# Patient Record
Sex: Male | Born: 1955
Health system: Southern US, Community
[De-identification: ages and names within clinical notes are randomized; demographics above are authoritative.]

## PROBLEM LIST (undated history)

## (undated) DIAGNOSIS — E785 Hyperlipidemia, unspecified: Secondary | ICD-10-CM

## (undated) DIAGNOSIS — I10 Essential (primary) hypertension: Secondary | ICD-10-CM

## (undated) DIAGNOSIS — C9110 Chronic lymphocytic leukemia of B-cell type not having achieved remission: Secondary | ICD-10-CM

## (undated) DIAGNOSIS — IMO0002 Reserved for concepts with insufficient information to code with codable children: Secondary | ICD-10-CM

## (undated) DIAGNOSIS — M1990 Unspecified osteoarthritis, unspecified site: Secondary | ICD-10-CM

## (undated) DIAGNOSIS — T7840XA Allergy, unspecified, initial encounter: Secondary | ICD-10-CM

## (undated) DIAGNOSIS — B029 Zoster without complications: Secondary | ICD-10-CM

## (undated) DIAGNOSIS — K219 Gastro-esophageal reflux disease without esophagitis: Secondary | ICD-10-CM

## (undated) HISTORY — DX: Gastro-esophageal reflux disease without esophagitis: K21.9

## (undated) HISTORY — DX: Hyperlipidemia, unspecified: E78.5

## (undated) HISTORY — DX: Allergy, unspecified, initial encounter: T78.40XA

## (undated) HISTORY — PX: SHOULDER SURGERY: SHX246

## (undated) HISTORY — DX: Reserved for concepts with insufficient information to code with codable children: IMO0002

## (undated) HISTORY — PX: WRIST SURGERY: SHX841

## (undated) HISTORY — DX: Essential (primary) hypertension: I10

## (undated) HISTORY — PX: KNEE SURGERY: SHX244

## (undated) HISTORY — DX: Unspecified osteoarthritis, unspecified site: M19.90

## (undated) HISTORY — PX: HERNIA REPAIR: SHX51

## (undated) HISTORY — DX: Chronic lymphocytic leukemia of B-cell type not having achieved remission: C91.10

---

## 1983-07-24 HISTORY — PX: HAND SURGERY: SHX662

## 1988-07-23 HISTORY — PX: HERNIA REPAIR: SHX51

## 2001-04-13 ENCOUNTER — Emergency Department (HOSPITAL_COMMUNITY): Admission: EM | Admit: 2001-04-13 | Discharge: 2001-04-13 | Payer: Self-pay | Admitting: Emergency Medicine

## 2001-04-18 ENCOUNTER — Emergency Department (HOSPITAL_COMMUNITY): Admission: EM | Admit: 2001-04-18 | Discharge: 2001-04-18 | Payer: Self-pay | Admitting: Emergency Medicine

## 2003-05-06 ENCOUNTER — Encounter: Payer: Self-pay | Admitting: Specialist

## 2003-05-06 ENCOUNTER — Ambulatory Visit (HOSPITAL_COMMUNITY): Admission: RE | Admit: 2003-05-06 | Discharge: 2003-05-06 | Payer: Self-pay | Admitting: Specialist

## 2003-07-24 HISTORY — PX: KNEE SURGERY: SHX244

## 2005-03-04 ENCOUNTER — Emergency Department (HOSPITAL_COMMUNITY): Admission: EM | Admit: 2005-03-04 | Discharge: 2005-03-04 | Payer: Self-pay | Admitting: Family Medicine

## 2005-07-23 HISTORY — PX: SHOULDER SURGERY: SHX246

## 2008-09-23 ENCOUNTER — Encounter: Admission: RE | Admit: 2008-09-23 | Discharge: 2008-09-23 | Payer: Self-pay | Admitting: Specialist

## 2008-11-19 ENCOUNTER — Ambulatory Visit (HOSPITAL_BASED_OUTPATIENT_CLINIC_OR_DEPARTMENT_OTHER): Admission: RE | Admit: 2008-11-19 | Discharge: 2008-11-19 | Payer: Self-pay | Admitting: Specialist

## 2009-04-06 ENCOUNTER — Ambulatory Visit: Payer: Self-pay | Admitting: Hematology & Oncology

## 2009-04-21 ENCOUNTER — Encounter: Payer: Self-pay | Admitting: Hematology & Oncology

## 2009-04-21 ENCOUNTER — Other Ambulatory Visit: Admission: RE | Admit: 2009-04-21 | Discharge: 2009-04-21 | Payer: Self-pay | Admitting: Hematology & Oncology

## 2009-04-21 LAB — CBC WITH DIFFERENTIAL (CANCER CENTER ONLY)
BASO#: 0.2 10*3/uL (ref 0.0–0.2)
BASO%: 1.4 % (ref 0.0–2.0)
EOS%: 2.3 % (ref 0.0–7.0)
Eosinophils Absolute: 0.3 10*3/uL (ref 0.0–0.5)
HCT: 41.9 % (ref 38.7–49.9)
HGB: 14.7 g/dL (ref 13.0–17.1)
LYMPH#: 9 10*3/uL — ABNORMAL HIGH (ref 0.9–3.3)
LYMPH%: 63.5 % — ABNORMAL HIGH (ref 14.0–48.0)
MCH: 33.2 pg (ref 28.0–33.4)
MCHC: 35.1 g/dL (ref 32.0–35.9)
MCV: 95 fL (ref 82–98)
MONO#: 0.5 10*3/uL (ref 0.1–0.9)
MONO%: 3.4 % (ref 0.0–13.0)
NEUT#: 4.2 10*3/uL (ref 1.5–6.5)
NEUT%: 29.4 % — ABNORMAL LOW (ref 40.0–80.0)
Platelets: 172 10*3/uL (ref 145–400)
RBC: 4.43 10*6/uL (ref 4.20–5.70)
RDW: 11.1 % (ref 10.5–14.6)
WBC: 14.2 10*3/uL — ABNORMAL HIGH (ref 4.0–10.0)

## 2009-04-21 LAB — TECHNOLOGIST REVIEW CHCC SATELLITE

## 2009-04-21 LAB — CHCC SATELLITE - SMEAR

## 2009-04-25 LAB — PROTEIN ELECTROPHORESIS, SERUM
Albumin ELP: 61.1 % (ref 55.8–66.1)
Alpha-1-Globulin: 4.1 % (ref 2.9–4.9)
Alpha-2-Globulin: 10.8 % (ref 7.1–11.8)
Beta 2: 4.5 % (ref 3.2–6.5)
Beta Globulin: 6.1 % (ref 4.7–7.2)
Gamma Globulin: 13.4 % (ref 11.1–18.8)
Total Protein, Serum Electrophoresis: 6.4 g/dL (ref 6.0–8.3)

## 2009-04-25 LAB — LACTATE DEHYDROGENASE: LDH: 124 U/L (ref 94–250)

## 2009-04-25 LAB — DIRECT ANTIGLOBULIN TEST (NOT AT ARMC)
DAT (Complement): NEGATIVE
DAT IgG: NEGATIVE

## 2009-04-25 LAB — FLOW CYTOMETRY - CHCC SATELLITE

## 2009-06-28 ENCOUNTER — Ambulatory Visit: Payer: Self-pay | Admitting: Hematology & Oncology

## 2009-08-05 ENCOUNTER — Ambulatory Visit: Payer: Self-pay | Admitting: Hematology & Oncology

## 2009-08-08 LAB — CBC WITH DIFFERENTIAL (CANCER CENTER ONLY)
BASO#: 0.3 10*3/uL — ABNORMAL HIGH (ref 0.0–0.2)
BASO%: 1.6 % (ref 0.0–2.0)
EOS%: 2 % (ref 0.0–7.0)
Eosinophils Absolute: 0.3 10*3/uL (ref 0.0–0.5)
HCT: 46.4 % (ref 38.7–49.9)
HGB: 15.9 g/dL (ref 13.0–17.1)
LYMPH#: 10.8 10*3/uL — ABNORMAL HIGH (ref 0.9–3.3)
LYMPH%: 66 % — ABNORMAL HIGH (ref 14.0–48.0)
MCH: 32.7 pg (ref 28.0–33.4)
MCHC: 34.3 g/dL (ref 32.0–35.9)
MCV: 95 fL (ref 82–98)
MONO#: 0.6 10*3/uL (ref 0.1–0.9)
MONO%: 3.4 % (ref 0.0–13.0)
NEUT#: 4.4 10*3/uL (ref 1.5–6.5)
NEUT%: 27 % — ABNORMAL LOW (ref 40.0–80.0)
Platelets: 189 10*3/uL (ref 145–400)
RBC: 4.87 10*6/uL (ref 4.20–5.70)
RDW: 11.6 % (ref 10.5–14.6)
WBC: 16.3 10*3/uL — ABNORMAL HIGH (ref 4.0–10.0)

## 2009-08-08 LAB — TECHNOLOGIST REVIEW CHCC SATELLITE

## 2009-08-08 LAB — CHCC SATELLITE - SMEAR

## 2009-11-10 ENCOUNTER — Emergency Department (HOSPITAL_COMMUNITY): Admission: EM | Admit: 2009-11-10 | Discharge: 2009-11-10 | Payer: Self-pay | Admitting: Family Medicine

## 2009-11-13 ENCOUNTER — Emergency Department (HOSPITAL_COMMUNITY): Admission: EM | Admit: 2009-11-13 | Discharge: 2009-11-13 | Payer: Self-pay | Admitting: Family Medicine

## 2009-11-17 ENCOUNTER — Ambulatory Visit: Payer: Self-pay | Admitting: Hematology & Oncology

## 2009-11-21 LAB — CBC WITH DIFFERENTIAL (CANCER CENTER ONLY)
BASO#: 0.3 10*3/uL — ABNORMAL HIGH (ref 0.0–0.2)
BASO%: 2 % (ref 0.0–2.0)
EOS%: 2.1 % (ref 0.0–7.0)
Eosinophils Absolute: 0.4 10*3/uL (ref 0.0–0.5)
HCT: 45.2 % (ref 38.7–49.9)
HGB: 15.1 g/dL (ref 13.0–17.1)
LYMPH#: 11.8 10*3/uL — ABNORMAL HIGH (ref 0.9–3.3)
LYMPH%: 70.2 % — ABNORMAL HIGH (ref 14.0–48.0)
MCH: 32.1 pg (ref 28.0–33.4)
MCHC: 33.5 g/dL (ref 32.0–35.9)
MCV: 96 fL (ref 82–98)
MONO#: 0.5 10*3/uL (ref 0.1–0.9)
MONO%: 2.9 % (ref 0.0–13.0)
NEUT#: 3.8 10*3/uL (ref 1.5–6.5)
NEUT%: 22.8 % — ABNORMAL LOW (ref 40.0–80.0)
Platelets: 158 10*3/uL (ref 145–400)
RBC: 4.71 10*6/uL (ref 4.20–5.70)
RDW: 11.6 % (ref 10.5–14.6)
WBC: 16.8 10*3/uL — ABNORMAL HIGH (ref 4.0–10.0)

## 2009-11-21 LAB — CHCC SATELLITE - SMEAR

## 2010-04-04 ENCOUNTER — Ambulatory Visit: Payer: Self-pay | Admitting: Hematology & Oncology

## 2010-04-06 LAB — TECHNOLOGIST REVIEW CHCC SATELLITE

## 2010-04-06 LAB — CBC WITH DIFFERENTIAL (CANCER CENTER ONLY)
BASO#: 0.4 10*3/uL — ABNORMAL HIGH (ref 0.0–0.2)
BASO%: 2 % (ref 0.0–2.0)
EOS%: 1.4 % (ref 0.0–7.0)
Eosinophils Absolute: 0.3 10*3/uL (ref 0.0–0.5)
HCT: 45.9 % (ref 38.7–49.9)
HGB: 15.6 g/dL (ref 13.0–17.1)
LYMPH#: 14 10*3/uL — ABNORMAL HIGH (ref 0.9–3.3)
LYMPH%: 73.9 % — ABNORMAL HIGH (ref 14.0–48.0)
MCH: 32.5 pg (ref 28.0–33.4)
MCHC: 33.9 g/dL (ref 32.0–35.9)
MCV: 96 fL (ref 82–98)
MONO#: 0.6 10*3/uL (ref 0.1–0.9)
MONO%: 3.3 % (ref 0.0–13.0)
NEUT#: 3.7 10*3/uL (ref 1.5–6.5)
NEUT%: 19.4 % — ABNORMAL LOW (ref 40.0–80.0)
Platelets: 179 10*3/uL (ref 145–400)
RBC: 4.78 10*6/uL (ref 4.20–5.70)
RDW: 11.1 % (ref 10.5–14.6)
WBC: 18.9 10*3/uL — ABNORMAL HIGH (ref 4.0–10.0)

## 2010-04-06 LAB — CHCC SATELLITE - SMEAR

## 2010-05-14 ENCOUNTER — Emergency Department (HOSPITAL_COMMUNITY): Admission: EM | Admit: 2010-05-14 | Discharge: 2010-05-14 | Payer: Self-pay | Admitting: Family Medicine

## 2010-05-29 ENCOUNTER — Emergency Department (HOSPITAL_COMMUNITY)
Admission: EM | Admit: 2010-05-29 | Discharge: 2010-05-29 | Payer: Self-pay | Source: Home / Self Care | Admitting: Emergency Medicine

## 2010-07-14 ENCOUNTER — Emergency Department (HOSPITAL_COMMUNITY)
Admission: EM | Admit: 2010-07-14 | Discharge: 2010-07-14 | Payer: Self-pay | Source: Home / Self Care | Admitting: Family Medicine

## 2010-08-15 ENCOUNTER — Ambulatory Visit (HOSPITAL_BASED_OUTPATIENT_CLINIC_OR_DEPARTMENT_OTHER): Payer: Commercial Managed Care - PPO | Admitting: Hematology & Oncology

## 2010-08-24 ENCOUNTER — Encounter (HOSPITAL_BASED_OUTPATIENT_CLINIC_OR_DEPARTMENT_OTHER): Payer: Commercial Managed Care - PPO | Admitting: Hematology & Oncology

## 2010-08-24 DIAGNOSIS — C9110 Chronic lymphocytic leukemia of B-cell type not having achieved remission: Secondary | ICD-10-CM

## 2010-08-24 LAB — CBC WITH DIFFERENTIAL (CANCER CENTER ONLY)
BASO#: 0.8 10*3/uL — ABNORMAL HIGH (ref 0.0–0.2)
BASO%: 2.9 % — ABNORMAL HIGH (ref 0.0–2.0)
EOS%: 1.1 % (ref 0.0–7.0)
Eosinophils Absolute: 0.3 10*3/uL (ref 0.0–0.5)
HCT: 45.6 % (ref 38.7–49.9)
HGB: 15.7 g/dL (ref 13.0–17.1)
LYMPH#: 20.3 10*3/uL — ABNORMAL HIGH (ref 0.9–3.3)
LYMPH%: 79 % — ABNORMAL HIGH (ref 14.0–48.0)
MCH: 33.6 pg — ABNORMAL HIGH (ref 28.0–33.4)
MCHC: 34.4 g/dL (ref 32.0–35.9)
MCV: 98 fL (ref 82–98)
MONO#: 1.1 10*3/uL — ABNORMAL HIGH (ref 0.1–0.9)
MONO%: 4.1 % (ref 0.0–13.0)
NEUT#: 3.3 10*3/uL (ref 1.5–6.5)
NEUT%: 12.9 % — ABNORMAL LOW (ref 40.0–80.0)
Platelets: 158 10*3/uL (ref 145–400)
RBC: 4.67 10*6/uL (ref 4.20–5.70)
RDW: 11.2 % (ref 10.5–14.6)
WBC: 25.7 10*3/uL — ABNORMAL HIGH (ref 4.0–10.0)

## 2010-08-24 LAB — TECHNOLOGIST REVIEW CHCC SATELLITE

## 2010-08-24 LAB — CHCC SATELLITE - SMEAR

## 2010-11-01 LAB — POCT HEMOGLOBIN-HEMACUE: Hemoglobin: 15.8 g/dL (ref 13.0–17.0)

## 2010-11-16 ENCOUNTER — Other Ambulatory Visit: Payer: Self-pay | Admitting: Hematology & Oncology

## 2010-11-16 ENCOUNTER — Encounter (HOSPITAL_BASED_OUTPATIENT_CLINIC_OR_DEPARTMENT_OTHER): Payer: Commercial Managed Care - PPO | Admitting: Hematology & Oncology

## 2010-11-16 DIAGNOSIS — C9110 Chronic lymphocytic leukemia of B-cell type not having achieved remission: Secondary | ICD-10-CM

## 2010-11-16 LAB — CBC WITH DIFFERENTIAL (CANCER CENTER ONLY)
HCT: 42.9 % (ref 38.7–49.9)
HGB: 14.8 g/dL (ref 13.0–17.1)
MCH: 32.6 pg (ref 28.0–33.4)
MCHC: 34.5 g/dL (ref 32.0–35.9)
MCV: 95 fL (ref 82–98)
Platelets: 144 10*3/uL — ABNORMAL LOW (ref 145–400)
RBC: 4.54 10*6/uL (ref 4.20–5.70)
RDW: 13.1 % (ref 11.1–15.7)
WBC: 38.6 10*3/uL — ABNORMAL HIGH (ref 4.0–10.0)

## 2010-11-16 LAB — MANUAL DIFFERENTIAL (CHCC SATELLITE)
ALC: 32.4 10*3/uL — ABNORMAL HIGH (ref 0.9–3.3)
ANC (CHCC HP manual diff): 5.4 10*3/uL (ref 1.5–6.5)
Eos: 1 % (ref 0–7)
LYMPH: 84 % — ABNORMAL HIGH (ref 14–48)
MONO: 1 % (ref 0–13)
PLT EST ~~LOC~~: ADEQUATE
Platelet Morphology: NORMAL
SEG: 14 % — ABNORMAL LOW (ref 40–75)

## 2010-11-16 LAB — CHCC SATELLITE - SMEAR

## 2010-11-20 LAB — SPEP & IFE WITH QIG
Albumin ELP: 63.8 % (ref 55.8–66.1)
Alpha-1-Globulin: 4.1 % (ref 2.9–4.9)
Alpha-2-Globulin: 10.9 % (ref 7.1–11.8)
Beta 2: 3.9 % (ref 3.2–6.5)
Beta Globulin: 6 % (ref 4.7–7.2)
Gamma Globulin: 11.3 % (ref 11.1–18.8)
IgA: 106 mg/dL (ref 68–378)
IgG (Immunoglobin G), Serum: 825 mg/dL (ref 694–1618)
IgM, Serum: 34 mg/dL — ABNORMAL LOW (ref 60–263)
Total Protein, Serum Electrophoresis: 6.7 g/dL (ref 6.0–8.3)

## 2010-11-20 LAB — LACTATE DEHYDROGENASE: LDH: 146 U/L (ref 94–250)

## 2010-11-20 LAB — BETA 2 MICROGLOBULIN, SERUM: Beta-2 Microglobulin: 2 mg/L — ABNORMAL HIGH (ref 1.01–1.73)

## 2010-12-05 NOTE — Op Note (Signed)
NAME:  Charles [name], Charles [name] NO.:  [id number]   MEDICAL RECORD NO.:  [id number]          PATIENT TYPE:  AMB   LOCATION:  [facility]                         FACILITY:  [facility]   PHYSICIAN:  Erasmo Leventhal, M.D.DATE OF BIRTH:  07/02/1956   DATE OF PROCEDURE:  11/19/2008  DATE OF DISCHARGE:                               OPERATIVE REPORT   PREOPERATIVE DIAGNOSES:  1. Left shoulder labral tear.  2. Biceps tear.  3. Impingement syndrome.  4. Rotator cuff tear.  5. Acromioclavicular arthritis.   POSTOPERATIVE DIAGNOSES:  1. Left shoulder superior labral degenerative tearing.  2. Partial biceps tear, 25 cm.  3. Chronic impingement syndrome.  4. Rotator cuff tear, full-thickness supraspinatus insertion, 1.5-2      cm.  5. Acromioclavicular arthritis.   PROCEDURE:  1. Left shoulder exam under anesthesia.  2. Manipulation under anesthesia.  3. Glenohumeral arthroscopy with labral debridement.  4. Debridement of partial biceps tear.  5. Arthroscopic subacromial decompression, acromioplasty, bursectomy      to stable __________.  6. Arthroscopic clavicle resection Mumford procedure.  7. Arthroscopic rotator cuff repair.   SURGEON:  Erasmo Leventhal, M.D.   ASSISTANT:  Jamelle Rushing, P.A.-C.   ANESTHESIA:  Interscalene block with general, Dr. Shireen Quan.   BLOOD LOSS:  Less than 10 mL.   DRAINS:  None.   COMPLICATIONS:  None.   DISPOSITION:  PACU, stable.   OPERATIVE DETAILS:  Patient and family were counseled in the holding  area.  Correct side was identified, IV was started and interscalene  block was administered by Dr. Shireen Quan.  Taken to OR, IV antibiotics  were given.  Left shoulder was examined.  Slightly tight in abduction,  forward flexion.  Gentle manipulation revealed full motion.   Turned to right lateral decubitus position, properly padded and bumped.  Left shoulder prepped with DuraPrep, draped in sterile fashion.  Arthrex  shoulder positioner  utilized, 30 degrees abduction, 10 degrees forward  flexion and 20 pounds longitudinal traction due to the large size of his  arm.  Posterior portal was created and arthroscope placed in the  glenohumeral joint.   Diagnostic arthroscopy revealed multiple labral degenerative type tears  of the superior labrum but there was no __________ of biceps anchor.  There was a full rotator cuff tear at the supraspinatus, approximately  1.5 to 2 cm.  Lateral portal was established.  Scope was introduced into  the joint through the tear.  The superior labral tissue was debrided  back to a stable edge and then smoothed down with the ArthroCare system.  Biceps was inspected, 25% partial tear, debrided with the motorized  shaver.  The rotator cuff was then debrided back to healthy tissues and  repair site down to bone was repaired.  Remainder of intra-articular  anatomy was normal.   Subacromial region revealed thick subacromial bursitis.  Subacromial  bursa was resected.  Type 2 acromion.  ArthroCare system was utilized to  remove the periosteum, CA ligament.  Bur was then placed posteriorly and  an anterior-inferior acromioplasty was performed.  The [facility] joint was  found to be  markedly osteoarthritic.   Another entry portal was made.  The lateral 5-8 mm of his clavicle was  removed circumferentially, leaving the anterior, superior and posterior  acromioclavicular capsule intact.  Clavicle was palpated, found to be  stable.  Arthroscopic debris was removed.  Hemostasis was obtained.   Another small puncture wound was made.  An Arthrex bioabsorbable anchor  was placed at the appropriate angle.  Mattress sutures were placed and  tied down separately and then into a push-lock anchor for a double-row  technique.  We now had a well-mobilized repaired rotator cuff, covering  the rotator cuff footprint, excellent healthy tissue.  No other  abnormalities were noted.  Irrigant and arthroscopic equipment was   removed.  Taken out of traction.  After confirming anesthesia, another  20 mL of 0.25% Marcaine with epi placed in the portal sites, subacromial  region.  Placed into an abduction sling, turned supine, given another  gram of Ancef intravenously, awakened, taken from the operating room to  PACU in stable condition.  Sponge and needle count correct.  No  complications or problems.  He will be stabilized in the PACU and then  discharged to home.   Help with surgical technique, decision-making, Mr. Oneida Alar, PA-C's  assistance was needed throughout the entire case.           ______________________________  Erasmo Leventhal, M.D.     RAC/MEDQ  D:  11/19/2008  T:  11/19/2008  Job:  [id number]

## 2011-01-31 ENCOUNTER — Other Ambulatory Visit: Payer: Self-pay | Admitting: Hematology & Oncology

## 2011-01-31 ENCOUNTER — Encounter (HOSPITAL_BASED_OUTPATIENT_CLINIC_OR_DEPARTMENT_OTHER): Payer: Commercial Managed Care - PPO | Admitting: Hematology & Oncology

## 2011-01-31 DIAGNOSIS — C9110 Chronic lymphocytic leukemia of B-cell type not having achieved remission: Secondary | ICD-10-CM

## 2011-01-31 LAB — CBC WITH DIFFERENTIAL (CANCER CENTER ONLY)
HCT: 41.3 % (ref 38.7–49.9)
HGB: 14.6 g/dL (ref 13.0–17.1)
MCH: 33.7 pg — ABNORMAL HIGH (ref 28.0–33.4)
MCHC: 35.4 g/dL (ref 32.0–35.9)
MCV: 95 fL (ref 82–98)
Platelets: 126 10*3/uL — ABNORMAL LOW (ref 145–400)
RBC: 4.33 10*6/uL (ref 4.20–5.70)
RDW: 12.8 % (ref 11.1–15.7)
WBC: 45.4 10*3/uL — ABNORMAL HIGH (ref 4.0–10.0)

## 2011-01-31 LAB — MANUAL DIFFERENTIAL (CHCC SATELLITE)
ALC: 37.7 10*3/uL — ABNORMAL HIGH (ref 0.9–3.3)
ANC (CHCC HP manual diff): 5.4 10*3/uL (ref 1.5–6.5)
BASO: 1 % (ref 0–2)
Eos: 3 % (ref 0–7)
LYMPH: 83 % — ABNORMAL HIGH (ref 14–48)
MONO: 1 % (ref 0–13)
PLT EST ~~LOC~~: DECREASED
Platelet Morphology: NORMAL
SEG: 12 % — ABNORMAL LOW (ref 40–75)

## 2011-01-31 LAB — CHCC SATELLITE - SMEAR

## 2011-02-02 LAB — SPEP & IFE WITH QIG
Albumin ELP: 62.8 % (ref 55.8–66.1)
Alpha-1-Globulin: 4.2 % (ref 2.9–4.9)
Alpha-2-Globulin: 11.1 % (ref 7.1–11.8)
Beta 2: 4.4 % (ref 3.2–6.5)
Beta Globulin: 6.2 % (ref 4.7–7.2)
Gamma Globulin: 11.3 % (ref 11.1–18.8)
IgA: 102 mg/dL (ref 68–379)
IgG (Immunoglobin G), Serum: 817 mg/dL (ref 650–1600)
IgM, Serum: 28 mg/dL — ABNORMAL LOW (ref 41–251)
Total Protein, Serum Electrophoresis: 6.3 g/dL (ref 6.0–8.3)

## 2011-02-02 LAB — LACTATE DEHYDROGENASE: LDH: 140 U/L (ref 94–250)

## 2011-02-02 LAB — RETICULOCYTES (CHCC)
ABS Retic: 79.6 10*3/uL (ref 19.0–186.0)
RBC.: 4.42 MIL/uL (ref 4.22–5.81)
Retic Ct Pct: 1.8 % (ref 0.4–2.3)

## 2011-05-03 ENCOUNTER — Other Ambulatory Visit: Payer: Self-pay | Admitting: Hematology & Oncology

## 2011-05-03 ENCOUNTER — Encounter (HOSPITAL_BASED_OUTPATIENT_CLINIC_OR_DEPARTMENT_OTHER): Payer: Commercial Managed Care - PPO | Admitting: Hematology & Oncology

## 2011-05-03 DIAGNOSIS — C9110 Chronic lymphocytic leukemia of B-cell type not having achieved remission: Secondary | ICD-10-CM

## 2011-05-03 LAB — COMPREHENSIVE METABOLIC PANEL
ALT: 18 U/L (ref 0–53)
AST: 27 U/L (ref 0–37)
Albumin: 4.5 g/dL (ref 3.5–5.2)
Alkaline Phosphatase: 85 U/L (ref 39–117)
BUN: 9 mg/dL (ref 6–23)
CO2: 24 mEq/L (ref 19–32)
Calcium: 9.2 mg/dL (ref 8.4–10.5)
Chloride: 104 mEq/L (ref 96–112)
Creatinine, Ser: 1 mg/dL (ref 0.50–1.35)
Glucose, Bld: 112 mg/dL — ABNORMAL HIGH (ref 70–99)
Potassium: 4.5 mEq/L (ref 3.5–5.3)
Sodium: 139 mEq/L (ref 135–145)
Total Bilirubin: 0.5 mg/dL (ref 0.3–1.2)
Total Protein: 6.6 g/dL (ref 6.0–8.3)

## 2011-05-03 LAB — CBC WITH DIFFERENTIAL (CANCER CENTER ONLY)
HCT: 42.7 % (ref 38.7–49.9)
HGB: 15 g/dL (ref 13.0–17.1)
MCH: 33.5 pg — ABNORMAL HIGH (ref 28.0–33.4)
MCHC: 35.1 g/dL (ref 32.0–35.9)
MCV: 95 fL (ref 82–98)
Platelets: 139 10*3/uL — ABNORMAL LOW (ref 145–400)
RBC: 4.48 10*6/uL (ref 4.20–5.70)
RDW: 13.1 % (ref 11.1–15.7)
WBC: 45.1 10*3/uL — ABNORMAL HIGH (ref 4.0–10.0)

## 2011-05-03 LAB — MANUAL DIFFERENTIAL (CHCC SATELLITE)
ALC: 40.6 10*3/uL — ABNORMAL HIGH (ref 0.9–3.3)
ANC (CHCC HP manual diff): 4.1 10*3/uL (ref 1.5–6.5)
BASO: 1 % (ref 0–2)
LYMPH: 90 % — ABNORMAL HIGH (ref 14–48)
PLT EST ~~LOC~~: DECREASED
Platelet Morphology: NORMAL
SEG: 9 % — ABNORMAL LOW (ref 40–75)

## 2011-05-03 LAB — LACTATE DEHYDROGENASE: LDH: 157 U/L (ref 94–250)

## 2011-05-03 LAB — CHCC SATELLITE - SMEAR

## 2011-08-03 ENCOUNTER — Ambulatory Visit (HOSPITAL_BASED_OUTPATIENT_CLINIC_OR_DEPARTMENT_OTHER)
Admission: RE | Admit: 2011-08-03 | Discharge: 2011-08-03 | Disposition: A | Discharge status: Home / Self Care | Payer: 59 | Source: Ambulatory Visit | Attending: Hematology & Oncology | Admitting: Hematology & Oncology

## 2011-08-03 ENCOUNTER — Other Ambulatory Visit: Payer: Self-pay | Admitting: Hematology & Oncology

## 2011-08-03 ENCOUNTER — Ambulatory Visit (HOSPITAL_BASED_OUTPATIENT_CLINIC_OR_DEPARTMENT_OTHER): Payer: 59 | Admitting: Hematology & Oncology

## 2011-08-03 ENCOUNTER — Other Ambulatory Visit: Payer: Commercial Managed Care - PPO | Admitting: Lab

## 2011-08-03 ENCOUNTER — Encounter: Payer: Self-pay | Admitting: Hematology & Oncology

## 2011-08-03 DIAGNOSIS — D696 Thrombocytopenia, unspecified: Secondary | ICD-10-CM

## 2011-08-03 DIAGNOSIS — C9110 Chronic lymphocytic leukemia of B-cell type not having achieved remission: Secondary | ICD-10-CM

## 2011-08-03 DIAGNOSIS — M7989 Other specified soft tissue disorders: Secondary | ICD-10-CM

## 2011-08-03 DIAGNOSIS — M79609 Pain in unspecified limb: Secondary | ICD-10-CM

## 2011-08-03 HISTORY — DX: Chronic lymphocytic leukemia of B-cell type not having achieved remission: C91.10

## 2011-08-03 LAB — CBC WITH DIFFERENTIAL (CANCER CENTER ONLY)
HCT: 43.6 % (ref 38.7–49.9)
HGB: 14.9 g/dL (ref 13.0–17.1)
MCH: 33 pg (ref 28.0–33.4)
MCHC: 34.2 g/dL (ref 32.0–35.9)
MCV: 97 fL (ref 82–98)
Platelets: 139 10*3/uL — ABNORMAL LOW (ref 145–400)
RBC: 4.51 10*6/uL (ref 4.20–5.70)
RDW: 13.2 % (ref 11.1–15.7)
WBC: 63.6 10*3/uL (ref 4.0–10.0)

## 2011-08-03 LAB — MANUAL DIFFERENTIAL (CHCC SATELLITE)
ALC: 59.8 10*3/uL — ABNORMAL HIGH (ref 0.9–3.3)
ANC (CHCC HP manual diff): 3.2 10*3/uL (ref 1.5–6.5)
LYMPH: 94 % — ABNORMAL HIGH (ref 14–48)
MONO: 1 % (ref 0–13)
PLT EST ~~LOC~~: DECREASED
Platelet Morphology: NORMAL
SEG: 5 % — ABNORMAL LOW (ref 40–75)

## 2011-08-03 LAB — CHCC SATELLITE - SMEAR

## 2011-08-03 NOTE — Progress Notes (Signed)
This office note has been dictated.

## 2011-08-04 LAB — IGG, IGA, IGM
IgA: 91 mg/dL (ref 68–379)
IgG (Immunoglobin G), Serum: 760 mg/dL (ref 650–1600)
IgM, Serum: 27 mg/dL — ABNORMAL LOW (ref 41–251)

## 2011-08-04 NOTE — Progress Notes (Signed)
CC:   [name], M.D.  DIAGNOSIS:  CLL-stage B.  CURRENT THERAPY:  Observation.  INTERIM HISTORY:  [name] comes in for his followup.  We last saw him back in October.  He had no problems over the holiday.  He is still working at [employer] in [employer].  He is also a Naval architect.  His only complaint has been that for the past 3 days he has noted some discomfort in his right lower leg.  He says it starts behind the knee and then goes down his leg.  There has been no swelling.  He has had no redness in this area.  He says sometimes when he was stands up, that there is some weakness in his right leg.  We did go ahead and do a Doppler of his leg.  The Doppler was negative for a DVT.  Still awaiting to hear the final report regarding the possibility of a Baker cyst.  He did have shingles in the left T3 dermatome last year.  This has improved and he is not bothered by this at all.  He has had no fever.  He has not noted any swollen lymph glands.  He has had no nausea or vomiting.  There has been no change in bowel or bladder habits.  He has had really no fatigue.  Overall, his perform status is ECOG 0.  PHYSICAL EXAMINATION:  This is a well-developed, well-nourished white gentleman in no obvious distress.  Vital Signs:  97.2, pulse of 59, respiratory rate 14, blood pressure 122/76.  Weight is 258.  Head and neck exam shows a normocephalic, atraumatic skull.  There are no ocular or oral lesions.  There are no palpable cervical or supraclavicular lymph nodes.  Lungs are clear bilaterally.  Cardiac:  Regular rate and rhythm with a normal S1 and S2.  There are no murmurs, rubs or bruits. Abdomen:  Soft, mildly obese.  He has good bowel sounds.  There is no fluid wave.  There is no guarding or rebound tenderness.  There is no palpable hepatosplenomegaly.  Axillary exam shows no bilateral axillary adenopathy.  Extremities show no clubbing, cyanosis or edema.  I  cannot feel any palpable fullness behind the right knee.  There is no palpable venous cord in his legs.  He has a negative Homans' sign with his right leg.  He has good pulses in his distal extremities.  Skin:  No rashes, ecchymoses or petechia.  LABORATORY STUDIES:  White count is 63.6, hemoglobin 15, hematocrit 44, platelet count 140.  White cell differential shows 5% segs, 94% lymphocytes.  IMPRESSION:  Mr. Handel is a 56 year old white gentleman with CLL.  He has stage B disease.  His is not anemic.  His thrombocytopenia is minimal.  His white cell count certainly has "jumped up" since we last saw him. This might be a transient type of increase.  We are going to have to get him back in about 6 weeks or so.  I think that if his white cell count continues to go up, we are going to have to "jump in" and consider a more aggressive staging workup and possibly therapy.  We spent a good 45 minutes or so with Mr. Miklos today.  I told Mr. Hearty that if his leg continues to bother him in week or so, to let me know.  I would then get an MRI of his lower back, to make sure there is nothing going on with his  spinal cord.    ______________________________ Josph Macho, M.D. PRE/MEDQ  D:  08/03/2011  T:  08/04/2011  Job:  [id number]

## 2011-09-06 ENCOUNTER — Ambulatory Visit (HOSPITAL_BASED_OUTPATIENT_CLINIC_OR_DEPARTMENT_OTHER): Payer: 59 | Admitting: Hematology & Oncology

## 2011-09-06 ENCOUNTER — Other Ambulatory Visit (HOSPITAL_BASED_OUTPATIENT_CLINIC_OR_DEPARTMENT_OTHER): Payer: 59 | Admitting: Lab

## 2011-09-06 VITALS — BP 151/80 | HR 75 | Temp 97.0°F | Ht 69.0 in | Wt 255.0 lb

## 2011-09-06 DIAGNOSIS — C9110 Chronic lymphocytic leukemia of B-cell type not having achieved remission: Secondary | ICD-10-CM

## 2011-09-06 LAB — MANUAL DIFFERENTIAL (CHCC SATELLITE)
ALC: 60.2 10*3/uL — ABNORMAL HIGH (ref 0.9–3.3)
ANC (CHCC HP manual diff): 6.8 10*3/uL — ABNORMAL HIGH (ref 1.5–6.5)
Eos: 1 % (ref 0–7)
LYMPH: 88 % — ABNORMAL HIGH (ref 14–48)
MONO: 1 % (ref 0–13)
PLT EST ~~LOC~~: DECREASED
Platelet Morphology: NORMAL
SEG: 10 % — ABNORMAL LOW (ref 40–75)

## 2011-09-06 LAB — CBC WITH DIFFERENTIAL (CANCER CENTER ONLY)
HCT: 42.5 % (ref 38.7–49.9)
HGB: 14.4 g/dL (ref 13.0–17.1)
MCH: 33 pg (ref 28.0–33.4)
MCHC: 33.9 g/dL (ref 32.0–35.9)
MCV: 98 fL (ref 82–98)
Platelets: 139 10*3/uL — ABNORMAL LOW (ref 145–400)
RBC: 4.36 10*6/uL (ref 4.20–5.70)
RDW: 13.3 % (ref 11.1–15.7)
WBC: 68.4 10*3/uL (ref 4.0–10.0)

## 2011-09-06 NOTE — Progress Notes (Signed)
CC:   [name], M.D.  DIAGNOSIS:  Stage B chronic lymphocytic leukemia.  CURRENT THERAPY:  Observation.  INTERIM HISTORY:  Mr. Mitrano comes in for followup.  He is doing well. When we last saw him, his white cell count had jumped up from, I think, 45 to 63.  I was a little worried about that.  This is why I wanted him to come in a little bit sooner so that we could make sure his white cell count is not going up quickly.  He is still working without any problems.  He has had no fever.  His shingles that he developed in the left T3 dermatome has pretty much resolved.  He has not noted any problems with rashes.  He has had no change in bowel or bladder habits.  There have been no problems with leg swelling. He has had no cough.  There has been no headache.  PHYSICAL EXAMINATION:  General Appearance:  This is a well-developed, well-nourished, white gentleman in no obvious distress.  Vital Signs: Temperature 97.  Pulse 75.  Respiratory rate 16.  Blood pressure 151/80. Weight is 255.  Head and Neck Exam:  A normocephalic, atraumatic skull. There are no ocular or oral lesions.  There are no palpable cervical or supraclavicular lymph nodes.  Lungs:  Clear bilaterally.  Cardiac Exam: Regular rate and rhythm with a normal S1, S2.  There are no murmurs, rubs, or bruits.  Abdominal Exam:  Soft with good bowel sounds.  There is no palpable abdominal mass.  There is no fluid wave.  There is no palpable hepatosplenomegaly.  Back Exam:  No tenderness over the spine, ribs, or hips.  Extremities:  No clubbing, cyanosis, or edema. Neurological Exam:  No focal neurological deficits.  LABORATORY STUDIES:  White cell count is 68.4, hemoglobin 14.1, hematocrit 42.5, platelet count 139.  White cell differential shows 10% segs, 88% lymphocytes.  Peripheral smear shows increase in white cells. He has a majority of lymphocytes.  He has a few large lymphocytes.  I see no prolymphocytes.  His white  cells otherwise are with mature polys. His red cells are with no rouleaux formation.  There are no nucleated red blood cells.  There are no target cells.  Platelets are adequate in number and size.  Platelets are well granulated.  IMPRESSION:  Mr. Cavanah is a 56 year old, white gentleman with stage B chronic lymphocytic leukemia.  He is progressing slowly but surely.  I suspect that we likely will need to start treatment on him within 3-4 months.  I will have Mr. Savarese come back to see me in another 6 weeks.  If we ever do need to start him on therapy, we will have to do a complete workup with bone marrow, CT scan, and additional lab work.    ______________________________ Josph Macho, M.D. PRE/MEDQ  D:  09/06/2011  T:  09/06/2011  Job:  [id number]

## 2011-09-06 NOTE — Progress Notes (Signed)
This office note has been dictated.

## 2011-09-07 ENCOUNTER — Ambulatory Visit: Payer: 59 | Admitting: Hematology & Oncology

## 2011-09-07 ENCOUNTER — Other Ambulatory Visit: Payer: 59 | Admitting: Lab

## 2011-10-10 ENCOUNTER — Encounter (HOSPITAL_COMMUNITY): Payer: Self-pay | Admitting: Emergency Medicine

## 2011-10-10 ENCOUNTER — Emergency Department (HOSPITAL_COMMUNITY)
Admission: EM | Admit: 2011-10-10 | Discharge: 2011-10-10 | Disposition: A | Discharge status: Home / Self Care | Payer: 59 | Source: Home / Self Care | Attending: Emergency Medicine | Admitting: Emergency Medicine

## 2011-10-10 DIAGNOSIS — L03031 Cellulitis of right toe: Secondary | ICD-10-CM

## 2011-10-10 DIAGNOSIS — L02619 Cutaneous abscess of unspecified foot: Secondary | ICD-10-CM

## 2011-10-10 DIAGNOSIS — L03039 Cellulitis of unspecified toe: Secondary | ICD-10-CM

## 2011-10-10 HISTORY — DX: Zoster without complications: B02.9

## 2011-10-10 MED ORDER — IBUPROFEN 600 MG PO TABS: 600.0000 mg | ORAL_TABLET | Freq: Four times a day (QID) | ORAL | Status: AC | PRN

## 2011-10-10 MED ORDER — CEPHALEXIN 500 MG PO CAPS: 500.0000 mg | ORAL_CAPSULE | Freq: Four times a day (QID) | ORAL | Status: AC

## 2011-10-10 NOTE — ED Provider Notes (Signed)
History     CSN: [id number]  Arrival date & time 10/10/11  8295   First MD Initiated Contact with Patient 10/10/11 1021      Chief Complaint  Patient presents with  . Toe Pain    (Consider location/radiation/quality/duration/timing/severity/associated sxs/prior treatment) HPI Comments: Patient reports atraumatic right big toe redness, swelling at the lateral nail starting 2 days ago. Patient states that he was wearing narrow box shoes, but has now switched to a wider shoes since his symptoms started. Started soaking in Epsom salts last night, which helped somewhat. Does not recall any recent trauma to the area. Denies cutting his nails too short. No drainage, joint swelling, rash, numbness, fevers, redness streaking up his foot. No remote history of injury to this toe. No history of gout.  ROS as noted in HPI. All other ROS negative.   Patient is a 56 y.o. male presenting with toe pain. The history is provided by the patient. No language interpreter was used.  Toe Pain This is a new problem. The current episode started 2 days ago. The symptoms are aggravated by walking. The symptoms are relieved by nothing. He has tried a warm compress for the symptoms. The treatment provided mild relief.    Past Medical History  Diagnosis Date  . CLL (chronic lymphoblastic leukemia) 08/03/2011  . Shingles     Past Surgical History  Procedure Date  . Knee surgery   . Shoulder surgery   . Hernia repair   . Wrist surgery     History reviewed. No pertinent family history.  History  Substance Use Topics  . Smoking status: Never Smoker   . Smokeless tobacco: Not on file  . Alcohol Use: Yes      Review of Systems  Allergies  Review of patient's allergies indicates no known allergies.  Home Medications   Current Outpatient Rx  Name Route Sig Dispense Refill  . ACYCLOVIR 400 MG PO TABS Oral Take 500 mg by mouth daily.     Marland Kitchen VITAMIN C 1000 MG PO TABS Oral Take 1,000 mg by mouth  daily.    . CEPHALEXIN 500 MG PO CAPS Oral Take 1 capsule (500 mg total) by mouth 4 (four) times daily. X 10 days 40 capsule 0  . VITAMIN D3 2000 UNITS PO TABS Oral Take 2,000 Units by mouth.    . ESOMEPRAZOLE MAGNESIUM 40 MG PO CPDR Oral Take 40 mg by mouth daily before breakfast.    . IBUPROFEN 600 MG PO TABS Oral Take 1 tablet (600 mg total) by mouth every 6 (six) hours as needed for pain. 30 tablet 0  . ROSUVASTATIN CALCIUM 10 MG PO TABS Oral Take 10 mg by mouth daily.      BP 146/77  Pulse 63  Temp(Src) 98.3 F (36.8 C) (Oral)  Resp 18  SpO2 96%  Physical Exam  Nursing note and vitals reviewed. Constitutional: He is oriented to person, place, and time. He appears well-developed and well-nourished.  HENT:  Head: Normocephalic and atraumatic.  Eyes: Conjunctivae and EOM are normal.  Neck: Normal range of motion.  Cardiovascular: Normal rate.   Pulmonary/Chest: Effort normal. No respiratory distress.  Abdominal: He exhibits no distension.  Musculoskeletal: Normal range of motion.       Feet:       Redness, mild swelling, lateral nail fold. No expressible purulent drainage No evidence of paronychia, felon. Patient able to move all toes actively. Toe stable on varus/valgus stress. No tenderness, redness at the DIP  or MTP joint. Refill <2 seconds. Rest of foot within normal limits.  Neurological: He is alert and oriented to person, place, and time.  Skin: Skin is warm and dry.  Psychiatric: He has a normal mood and affect. His behavior is normal.    ED Course  Procedures (including critical care time)  Labs Reviewed - No data to display No results found.   1. Cellulitis of great toe, right     No evidence of gout, fracture. Appears to have a mild cellulitis/early ingrown toenail. Sending him home on Keflex, have him continue warm soaks, and elevate the nail with some cotton. If no improvement, we'll refer him to [facility]. Patient agrees with plan.  MDM  Previous  records reviewed.    Luiz Blare, MD 10/10/11 [id number]

## 2011-10-10 NOTE — ED Notes (Signed)
Pt c/o right big toe pain X 2 days. Pt denies hitting/stubbing it on anything. Pt stated "swelling has gone down some".

## 2011-10-10 NOTE — Discharge Instructions (Signed)
You may try to wedge a piece of cotton under the toenail to help prevent it from becoming ingrown. Continue soaking your foot in warm soapy water, and dry it carefully. Wear wide comfortable shoes. Elevate your foot as much as you can. Return if you get worse, have a fever >100,4, or for any concerns.

## 2011-10-19 ENCOUNTER — Ambulatory Visit (HOSPITAL_BASED_OUTPATIENT_CLINIC_OR_DEPARTMENT_OTHER): Payer: 59 | Admitting: Hematology & Oncology

## 2011-10-19 ENCOUNTER — Other Ambulatory Visit (HOSPITAL_BASED_OUTPATIENT_CLINIC_OR_DEPARTMENT_OTHER): Payer: 59 | Admitting: Lab

## 2011-10-19 VITALS — BP 148/72 | HR 56 | Temp 96.8°F | Ht 69.0 in | Wt 249.0 lb

## 2011-10-19 DIAGNOSIS — C9110 Chronic lymphocytic leukemia of B-cell type not having achieved remission: Secondary | ICD-10-CM

## 2011-10-19 LAB — MANUAL DIFFERENTIAL (CHCC SATELLITE)
ALC: 54.1 10*3/uL — ABNORMAL HIGH (ref 0.9–3.3)
ANC (CHCC HP manual diff): 3.6 10*3/uL (ref 1.5–6.5)
BASO: 1 % (ref 0–2)
Eos: 1 % (ref 0–7)
LYMPH: 90 % — ABNORMAL HIGH (ref 14–48)
MONO: 2 % (ref 0–13)
PLT EST ~~LOC~~: ADEQUATE
Platelet Morphology: NORMAL
RBC Comments: NORMAL
SEG: 6 % — ABNORMAL LOW (ref 40–75)

## 2011-10-19 LAB — CBC WITH DIFFERENTIAL (CANCER CENTER ONLY)
HCT: 42 % (ref 38.7–49.9)
HGB: 14.1 g/dL (ref 13.0–17.1)
MCH: 33.5 pg — ABNORMAL HIGH (ref 28.0–33.4)
MCHC: 33.6 g/dL (ref 32.0–35.9)
MCV: 100 fL — ABNORMAL HIGH (ref 82–98)
Platelets: 168 10*3/uL (ref 145–400)
RBC: 4.21 10*6/uL (ref 4.20–5.70)
RDW: 14.1 % (ref 11.1–15.7)
WBC: 60.1 10*3/uL (ref 4.0–10.0)

## 2011-10-19 LAB — CHCC SATELLITE - SMEAR

## 2011-10-19 NOTE — Progress Notes (Signed)
This office note has been dictated.

## 2011-10-22 NOTE — Progress Notes (Signed)
CC:   [name], M.D.  DIAGNOSIS:  Stage B chronic lymphocytic leukemia.  CURRENT THERAPY:  Observation.  INTERVAL HISTORY:  Mr. Schor comes in for his followup.  He continues to do well.  He is still working over at [employer] in [employer].  He also does Environmental education officer work.  He is getting ready for the resurrection Sunday weekend.  He has had no problems with fever.  He has had no sweats.  He has had no nausea and vomiting.  He has not noticed any palpable lymph glands.  He did have shingles in the left T3 dermatome.  This has not caused any problems for him.  He has had no problems with bowels or bladder.  He has not noticed any bleeding or bruising.  PHYSICAL EXAM:  This is a well-developed well-nourished white gentleman in no obvious distress.  Vital signs:  96.8, pulse 56, respiratory rate 18, blood pressure 148/72, weight is 249.  Head and neck:  A normocephalic, atraumatic skull.  There are no ocular or oral lesions. There are no palpable cervical or supraclavicular lymph nodes.  Lungs: Clear bilaterally.  Cardiac:  Regular rate and rhythm with a normal S1, S2.  There are no murmurs, rubs or bruits.  Abdomen:  Soft with good bowel sounds.  There is no palpable abdominal mass.  There is no fluid wave.  No palpable hepatosplenomegaly.  Axillary:  No bilateral axillary lymph nodes.  Neurological:  No focal neurological deficits.  LABORATORY DATA:  White cell count 60, hemoglobin 14.1, hematocrit 42, platelet count 168.  White cell differential shows 6 segs, 90% lymphocytes.  IMPRESSION:  Mr. Knickerbocker is a 56 year old gentleman with stage B chronic lymphocytic leukemia.  His white cell count actually is trending downward which is nice to see.  He is asymptomatic still.  I do not see any evidence for intervention with therapy right now.  I think we can probably get him back in 3 more months for followup.  I feel more reassured about his CLL right  now.  Again, the key, from my point of view, is that he is asymptomatic.   ______________________________ Josph Macho, M.D. PRE/MEDQ  D:  10/22/2011  T:  10/22/2011  Job:  [id number]

## 2011-12-28 ENCOUNTER — Other Ambulatory Visit (HOSPITAL_BASED_OUTPATIENT_CLINIC_OR_DEPARTMENT_OTHER): Payer: 59 | Admitting: Lab

## 2011-12-28 ENCOUNTER — Ambulatory Visit (HOSPITAL_BASED_OUTPATIENT_CLINIC_OR_DEPARTMENT_OTHER): Payer: 59 | Admitting: Hematology & Oncology

## 2011-12-28 VITALS — BP 142/81 | HR 58 | Temp 97.7°F | Ht 69.0 in | Wt 251.0 lb

## 2011-12-28 DIAGNOSIS — C9110 Chronic lymphocytic leukemia of B-cell type not having achieved remission: Secondary | ICD-10-CM

## 2011-12-28 LAB — CBC WITH DIFFERENTIAL (CANCER CENTER ONLY)
HCT: 41.4 % (ref 38.7–49.9)
HGB: 14.1 g/dL (ref 13.0–17.1)
MCH: 34 pg — ABNORMAL HIGH (ref 28.0–33.4)
MCHC: 34.1 g/dL (ref 32.0–35.9)
MCV: 100 fL — ABNORMAL HIGH (ref 82–98)
Platelets: 125 10*3/uL — ABNORMAL LOW (ref 145–400)
RBC: 4.15 10*6/uL — ABNORMAL LOW (ref 4.20–5.70)
RDW: 13.7 % (ref 11.1–15.7)
WBC: 73.3 10*3/uL (ref 4.0–10.0)

## 2011-12-28 LAB — MANUAL DIFFERENTIAL (CHCC SATELLITE)
ALC: 67.4 10*3/uL — ABNORMAL HIGH (ref 0.9–3.3)
ANC (CHCC HP manual diff): 4.4 10*3/uL (ref 1.5–6.5)
LYMPH: 92 % — ABNORMAL HIGH (ref 14–48)
MONO: 2 % (ref 0–13)
PLT EST ~~LOC~~: DECREASED
Platelet Morphology: NORMAL
RBC Comments: NORMAL
SEG: 6 % — ABNORMAL LOW (ref 40–75)

## 2011-12-28 LAB — CHCC SATELLITE - SMEAR

## 2011-12-28 NOTE — Progress Notes (Signed)
This office note has been dictated.

## 2011-12-29 NOTE — Progress Notes (Signed)
CC:   [name], M.D.  DIAGNOSIS:  Stage B chronic lymphocytic leukemia.  CURRENT THERAPY:  Observation.  INTERIM HISTORY:  [name] comes in for followup.  He is still doing well.  He has had no complaints at all.  He is still working over at [employer] in [employer].  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  He is also doing his Environmental education officer department work.  This is keeping him quite busy.  He has had no fevers.  He has had no rashes.  He has had no change in bowel or bladder habits.  He has had no cough.  There has been no leg swelling.  PHYSICAL EXAMINATION:  General:  This is a well-developed, well- nourished white gentleman in no obvious distress.  Vital signs:  Show a temperature of 97.7, pulse 58, respiratory rate 20, blood pressure 133/81.  Weight is 251.  Head and neck:  Exam shows a normocephalic, atraumatic skull.  There are no ocular or oral lesions.  There are no palpable cervical, supraclavicular lymph nodes.  Lungs:  Clear bilaterally.  Cardiac:  Regular rate and rhythm with normal S1, S2. There are no murmurs, rubs or bruits.  Abdomen:  Soft with good bowel sounds.  There is no palpable abdominal mass.  There is no palpable hepatosplenomegaly.  Back:  No tenderness over the spine, ribs or hips. Extremities:  Shows no clubbing, cyanosis or edema.  Axillary exam: Shows some palpable left axillary lymph nodes.  These are mobile, firm and slightly tender.  The largest 1 measures probably about 1.5 cm.  LABORATORY STUDIES:  White cell count 73.3, hemoglobin 14, hematocrit 31, platelet count 125.  IMPRESSION:  Charles [name] is a 56 year old gentleman with stage B chronic lymphocytic leukemia.  We have been following him now for a year or so.  So far, we have not had to pursue any other interventions as I have not needed to embark upon any therapy.  We will go ahead and get him back in another 2-3 months.  His  platelet count is down a bit.  He has done this before, however.  Again, I suspect that we are probably going to have to initiate therapy before the year is finished.    ______________________________ Josph Macho, M.D. PRE/MEDQ  D:  12/29/2011  T:  12/29/2011  Job:  [id number]

## 2012-02-08 ENCOUNTER — Other Ambulatory Visit: Payer: Self-pay | Admitting: *Deleted

## 2012-02-08 DIAGNOSIS — C9110 Chronic lymphocytic leukemia of B-cell type not having achieved remission: Secondary | ICD-10-CM

## 2012-02-08 MED ORDER — FAMCICLOVIR 500 MG PO TABS: 500.0000 mg | ORAL_TABLET | Freq: Every day | ORAL | Status: DC

## 2012-02-29 ENCOUNTER — Ambulatory Visit: Payer: 59 | Admitting: Hematology & Oncology

## 2012-02-29 ENCOUNTER — Other Ambulatory Visit: Payer: 59 | Admitting: Lab

## 2012-03-07 ENCOUNTER — Other Ambulatory Visit: Payer: Self-pay | Admitting: *Deleted

## 2012-03-07 ENCOUNTER — Ambulatory Visit (HOSPITAL_BASED_OUTPATIENT_CLINIC_OR_DEPARTMENT_OTHER): Payer: 59 | Admitting: Lab

## 2012-03-07 ENCOUNTER — Ambulatory Visit (HOSPITAL_BASED_OUTPATIENT_CLINIC_OR_DEPARTMENT_OTHER): Payer: 59 | Admitting: Hematology & Oncology

## 2012-03-07 VITALS — BP 136/77 | HR 59 | Temp 97.3°F | Resp 20 | Ht 69.0 in | Wt 245.0 lb

## 2012-03-07 DIAGNOSIS — C9110 Chronic lymphocytic leukemia of B-cell type not having achieved remission: Secondary | ICD-10-CM

## 2012-03-07 DIAGNOSIS — D696 Thrombocytopenia, unspecified: Secondary | ICD-10-CM

## 2012-03-07 LAB — CBC WITH DIFFERENTIAL (CANCER CENTER ONLY)
HCT: 40.9 % (ref 38.7–49.9)
HGB: 13.9 g/dL (ref 13.0–17.1)
MCH: 34.1 pg — ABNORMAL HIGH (ref 28.0–33.4)
MCHC: 34 g/dL (ref 32.0–35.9)
MCV: 100 fL — ABNORMAL HIGH (ref 82–98)
Platelets: 99 10*3/uL — ABNORMAL LOW (ref 145–400)
RBC: 4.08 10*6/uL — ABNORMAL LOW (ref 4.20–5.70)
RDW: 13.4 % (ref 11.1–15.7)
WBC: 87.2 10*3/uL (ref 4.0–10.0)

## 2012-03-07 LAB — MANUAL DIFFERENTIAL (CHCC SATELLITE)
ALC: 84.6 10*3/uL — ABNORMAL HIGH (ref 0.9–3.3)
ANC (CHCC HP manual diff): 0.9 10*3/uL — ABNORMAL LOW (ref 1.5–6.5)
LYMPH: 97 % — ABNORMAL HIGH (ref 14–48)
MONO: 2 % (ref 0–13)
PLT EST ~~LOC~~: DECREASED
Platelet Morphology: NORMAL
SEG: 1 % — ABNORMAL LOW (ref 40–75)

## 2012-03-07 LAB — CHCC SATELLITE - SMEAR

## 2012-03-07 NOTE — Progress Notes (Signed)
This office note has been dictated.

## 2012-03-08 NOTE — Progress Notes (Signed)
CC:   [name], M.D.  DIAGNOSIS:  Stage C chronic lymphocytic leukemia.  CURRENT THERAPY:  The patient will start chemotherapy with Treanda/Rituxan in 3-4 weeks.  INTERIM HISTORY:  [name] comes in for followup.  He is feeling well.  He has really had no specific complaints.  He has not noticed any swollen lymph glands.  He has had no fevers, sweats, or chills.  He is still working pretty much full-time.  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  He does a second job with the Warden/ranger.  He has had no bleeding or bruising.  He has not noted any rashes.  He has had no cough or shortness of breath.  Currently, he is on Famvir because of a history of shingles.  PHYSICAL EXAMINATION:  General:  This is a well-developed, well- nourished, white gentleman in no obvious distress.  Vital Signs: Temperature of 97.3, pulse 59, respiratory rate 20, blood pressure 136/77.  Weight is 245.  Head and Neck:  Normocephalic, atraumatic skull.  There are no ocular or oral lesions.  There are no palpable cervical or supraclavicular lymph nodes.  Lungs:  Clear bilaterally. Cardiac:  Regular rate and rhythm with a normal S1 and S2.  There are no murmurs, rubs, or bruits.  Abdomen:  Soft with good bowel sounds.  There is no palpable abdominal mass.  There is no palpable hepatosplenomegaly. Back:  No tenderness over the spine, ribs, or hips.  Extremities:  No clubbing, cyanosis, or edema.  Neurologic:  No focal neurological deficits.  Skin:  No rashes, ecchymoses, or petechia.  Axillary:  Some sable left axillary lymph nodes.  LABORATORY STUDIES:  White cell count is 87.2, hemoglobin 14, hematocrit 41, platelet count 99.  IMPRESSION:  [name] is a 56 year old gentleman with stage C chronic lymphocytic leukemia now.  He is thrombocytopenic.  His platelet count continues to drop.  His blood counts continues to arise.  I think everything is "falling into place" regarding his CLL  becoming active.  I think now is the time that we are going to have to start therapy on him.  We are going to have to do a bone marrow on him.  We will get him set up on August 27th for bone marrow biopsy.  He also needs to have CT scans done.  We will get these set up.  I talked to [name] at length.  I gave him some information about chemotherapy.  I think that Treanda/Rituxan would be a good choice for him.  The response rate really should be over 90%.  I think the bone marrow will be critical in that we will be able to send off cytogenetics to see if he does have any adverse chromosomal abnormalities.  I spent a good 40 minutes or so with [name].  He understands the situation well.  He does not want to set a date yet for actual chemo.  I told that we would probably would need to start in a month or so.  We will plan to get him in for treatment when he decides when.    ______________________________ Josph Macho, M.D. PRE/MEDQ  D:  03/07/2012  T:  03/08/2012  Job:  [id number]

## 2012-03-10 ENCOUNTER — Telehealth: Payer: Self-pay | Admitting: Hematology & Oncology

## 2012-03-10 ENCOUNTER — Encounter (HOSPITAL_COMMUNITY): Payer: Self-pay | Admitting: Pharmacy Technician

## 2012-03-10 NOTE — Telephone Encounter (Signed)
Pt aware of 8-27 BMBX and 8-28 CT and to drink at 8am and 9 am and to be NPO 4 hrs. He will call to schedule chemo

## 2012-03-18 ENCOUNTER — Ambulatory Visit (HOSPITAL_BASED_OUTPATIENT_CLINIC_OR_DEPARTMENT_OTHER): Payer: 59 | Admitting: Hematology & Oncology

## 2012-03-18 ENCOUNTER — Ambulatory Visit (HOSPITAL_COMMUNITY)
Admission: RE | Admit: 2012-03-18 | Discharge: 2012-03-18 | Disposition: A | Discharge status: Home / Self Care | Payer: 59 | Source: Ambulatory Visit | Attending: Hematology & Oncology | Admitting: Hematology & Oncology

## 2012-03-18 ENCOUNTER — Encounter (HOSPITAL_COMMUNITY): Payer: Self-pay

## 2012-03-18 ENCOUNTER — Other Ambulatory Visit: Payer: Self-pay | Admitting: Hematology & Oncology

## 2012-03-18 ENCOUNTER — Encounter: Payer: Self-pay | Admitting: Hematology & Oncology

## 2012-03-18 VITALS — BP 139/75 | HR 52 | Temp 97.5°F | Resp 18 | Ht 69.0 in | Wt 240.0 lb

## 2012-03-18 DIAGNOSIS — C9110 Chronic lymphocytic leukemia of B-cell type not having achieved remission: Secondary | ICD-10-CM | POA: Insufficient documentation

## 2012-03-18 DIAGNOSIS — D696 Thrombocytopenia, unspecified: Secondary | ICD-10-CM | POA: Insufficient documentation

## 2012-03-18 HISTORY — DX: Chronic lymphocytic leukemia of B-cell type not having achieved remission: C91.10

## 2012-03-18 LAB — CBC WITH DIFFERENTIAL/PLATELET
Basophils Absolute: 0 10*3/uL (ref 0.0–0.1)
Basophils Relative: 0 % (ref 0–1)
Eosinophils Absolute: 0 10*3/uL (ref 0.0–0.7)
Eosinophils Relative: 0 % (ref 0–5)
HCT: 39.6 % (ref 39.0–52.0)
Hemoglobin: 13.5 g/dL (ref 13.0–17.0)
Lymphocytes Relative: 94 % — ABNORMAL HIGH (ref 12–46)
Lymphs Abs: 77.9 10*3/uL — ABNORMAL HIGH (ref 0.7–4.0)
MCH: 33.8 pg (ref 26.0–34.0)
MCHC: 34.1 g/dL (ref 30.0–36.0)
MCV: 99 fL (ref 78.0–100.0)
Monocytes Absolute: 1.7 10*3/uL — ABNORMAL HIGH (ref 0.1–1.0)
Monocytes Relative: 2 % — ABNORMAL LOW (ref 3–12)
Neutro Abs: 3.3 10*3/uL (ref 1.7–7.7)
Neutrophils Relative %: 4 % — ABNORMAL LOW (ref 43–77)
Platelets: 126 10*3/uL — ABNORMAL LOW (ref 150–400)
RBC: 4 MIL/uL — ABNORMAL LOW (ref 4.22–5.81)
RDW: 13.7 % (ref 11.5–15.5)
WBC: 82.9 10*3/uL (ref 4.0–10.5)

## 2012-03-18 MED ORDER — SODIUM CHLORIDE 0.9 % IV SOLN
Freq: Once | INTRAVENOUS | Status: AC
  Administered 2012-03-18: 500 mL via INTRAVENOUS

## 2012-03-18 MED ORDER — MIDAZOLAM HCL 10 MG/2ML IJ SOLN
INTRAMUSCULAR | Status: AC
  Filled 2012-03-18: qty 2

## 2012-03-18 MED ORDER — MEPERIDINE HCL 50 MG/ML IJ SOLN: 50.0000 mg | Freq: Once | INTRAMUSCULAR | Status: DC

## 2012-03-18 MED ORDER — MEPERIDINE HCL 50 MG/ML IJ SOLN
INTRAMUSCULAR | Status: AC
  Administered 2012-03-18: 50 mg via INTRAVENOUS
  Filled 2012-03-18: qty 1

## 2012-03-18 MED ORDER — MIDAZOLAM HCL 5 MG/ML IJ SOLN
10.0000 mg | Freq: Once | INTRAMUSCULAR | Status: AC
  Administered 2012-03-18: 5 mg via INTRAVENOUS

## 2012-03-18 NOTE — ED Notes (Signed)
DSG CDI 

## 2012-03-18 NOTE — ED Notes (Signed)
Patient denies pain and is resting comfortably.  

## 2012-03-18 NOTE — ED Notes (Signed)
Vital signs stable. 

## 2012-03-18 NOTE — ED Notes (Signed)
Patient is resting comfortably. 

## 2012-03-18 NOTE — Sedation Documentation (Signed)
Medication dose calculated and verified OZD:GUYQI Pierre  Versed 5mg  and demerol 50mg 

## 2012-03-18 NOTE — ED Notes (Signed)
dsg cdi 

## 2012-03-18 NOTE — Progress Notes (Signed)
Received call from lab that patient has a critical lab value. WBC count is 82.9. Dr. Myna Hidalgo notified.

## 2012-03-18 NOTE — ED Notes (Signed)
Family updated as to patient's status.

## 2012-03-18 NOTE — ED Notes (Signed)
MD at bedside. 

## 2012-03-18 NOTE — ED Notes (Signed)
Family updated as to patient's status.  Wife in room.

## 2012-03-18 NOTE — Progress Notes (Signed)
This is a bone marrow biopsy and aspirate procedure note for [employer].  This was performed at [facility].  We did be appropriate timeout procedure.  He had an IV placed into his right hand.  His ASA class was 1. His Mallimpati score was 1.  He received 5 mg of Versed and 25 mg and Demerol for IV sedation.  The left posterior iliac crest region was prepped and draped in sterile fashion. 10 cc of 2% lidocaine were obtained on the skin down to the periosteum. A #11 scalpel was used to make an incision into the skin. Despite several attempts and aspirate, we cannot aspirate out any marrow.  A Jamshidi biopsy needle was then used. We got an excellent core. We spent a core into pathologic evaluation and cyto-genetics and FISH.  He tolerated the procedure well. The procedure site was dressed appropriately. There were no complications.   Pete E.

## 2012-03-18 NOTE — ED Notes (Signed)
dsg applied by md/cdi 

## 2012-03-19 ENCOUNTER — Encounter (HOSPITAL_COMMUNITY): Payer: Self-pay

## 2012-03-19 ENCOUNTER — Ambulatory Visit (HOSPITAL_COMMUNITY)
Admission: RE | Admit: 2012-03-19 | Discharge: 2012-03-19 | Disposition: A | Discharge status: Home / Self Care | Payer: 59 | Source: Ambulatory Visit | Attending: Hematology & Oncology | Admitting: Hematology & Oncology

## 2012-03-19 ENCOUNTER — Other Ambulatory Visit: Payer: Self-pay | Admitting: Hematology & Oncology

## 2012-03-19 ENCOUNTER — Telehealth: Payer: Self-pay | Admitting: Hematology & Oncology

## 2012-03-19 DIAGNOSIS — R161 Splenomegaly, not elsewhere classified: Secondary | ICD-10-CM | POA: Insufficient documentation

## 2012-03-19 DIAGNOSIS — C9110 Chronic lymphocytic leukemia of B-cell type not having achieved remission: Secondary | ICD-10-CM

## 2012-03-19 DIAGNOSIS — I2510 Atherosclerotic heart disease of native coronary artery without angina pectoris: Secondary | ICD-10-CM | POA: Insufficient documentation

## 2012-03-19 DIAGNOSIS — K449 Diaphragmatic hernia without obstruction or gangrene: Secondary | ICD-10-CM | POA: Insufficient documentation

## 2012-03-19 DIAGNOSIS — K4090 Unilateral inguinal hernia, without obstruction or gangrene, not specified as recurrent: Secondary | ICD-10-CM | POA: Insufficient documentation

## 2012-03-19 DIAGNOSIS — R599 Enlarged lymph nodes, unspecified: Secondary | ICD-10-CM | POA: Insufficient documentation

## 2012-03-19 MED ORDER — IOHEXOL 300 MG/ML  SOLN
125.0000 mL | Freq: Once | INTRAMUSCULAR | Status: AC | PRN
  Administered 2012-03-19: 125 mL via INTRAVENOUS

## 2012-03-19 NOTE — Telephone Encounter (Signed)
Pt aware of 9-19,20 and 10-17 appointments

## 2012-03-27 ENCOUNTER — Other Ambulatory Visit: Payer: Self-pay | Admitting: Hematology & Oncology

## 2012-04-10 ENCOUNTER — Ambulatory Visit (HOSPITAL_BASED_OUTPATIENT_CLINIC_OR_DEPARTMENT_OTHER): Payer: 59

## 2012-04-10 ENCOUNTER — Other Ambulatory Visit: Payer: Self-pay | Admitting: *Deleted

## 2012-04-10 ENCOUNTER — Other Ambulatory Visit: Payer: 59 | Admitting: Lab

## 2012-04-10 ENCOUNTER — Other Ambulatory Visit (HOSPITAL_BASED_OUTPATIENT_CLINIC_OR_DEPARTMENT_OTHER): Payer: 59 | Admitting: Lab

## 2012-04-10 ENCOUNTER — Ambulatory Visit: Payer: 59

## 2012-04-10 VITALS — BP 114/72 | HR 82 | Temp 98.0°F | Resp 16

## 2012-04-10 DIAGNOSIS — C9110 Chronic lymphocytic leukemia of B-cell type not having achieved remission: Secondary | ICD-10-CM

## 2012-04-10 DIAGNOSIS — Z23 Encounter for immunization: Secondary | ICD-10-CM

## 2012-04-10 DIAGNOSIS — Z5112 Encounter for antineoplastic immunotherapy: Secondary | ICD-10-CM

## 2012-04-10 LAB — CMP (CANCER CENTER ONLY)
ALT(SGPT): 14 U/L (ref 10–47)
AST: 22 U/L (ref 11–38)
Albumin: 4 g/dL (ref 3.3–5.5)
Alkaline Phosphatase: 97 U/L — ABNORMAL HIGH (ref 26–84)
BUN, Bld: 16 mg/dL (ref 7–22)
CO2: 27 mEq/L (ref 18–33)
Calcium: 9.3 mg/dL (ref 8.0–10.3)
Chloride: 102 mEq/L (ref 98–108)
Creat: 1.2 mg/dl (ref 0.6–1.2)
Glucose, Bld: 112 mg/dL (ref 73–118)
Potassium: 4.1 mEq/L (ref 3.3–4.7)
Sodium: 137 mEq/L (ref 128–145)
Total Bilirubin: 0.9 mg/dl (ref 0.20–1.60)
Total Protein: 6.7 g/dL (ref 6.4–8.1)

## 2012-04-10 LAB — CBC WITH DIFFERENTIAL (CANCER CENTER ONLY)
HCT: 39.7 % (ref 38.7–49.9)
HGB: 13.5 g/dL (ref 13.0–17.1)
MCH: 34.2 pg — ABNORMAL HIGH (ref 28.0–33.4)
MCHC: 34 g/dL (ref 32.0–35.9)
MCV: 101 fL — ABNORMAL HIGH (ref 82–98)
Platelets: 109 10*3/uL — ABNORMAL LOW (ref 145–400)
RBC: 3.95 10*6/uL — ABNORMAL LOW (ref 4.20–5.70)
RDW: 13.5 % (ref 11.1–15.7)
WBC: 79.7 10*3/uL (ref 4.0–10.0)

## 2012-04-10 LAB — MANUAL DIFFERENTIAL (CHCC SATELLITE)
ALC: 72.6 10*3/uL — ABNORMAL HIGH (ref 0.9–3.3)
ANC (CHCC HP manual diff): 3.2 10*3/uL (ref 1.5–6.5)
BASO: 2 % (ref 0–2)
LYMPH: 91 % — ABNORMAL HIGH (ref 14–48)
MONO: 3 % (ref 0–13)
PLT EST ~~LOC~~: DECREASED
SEG: 4 % — ABNORMAL LOW (ref 40–75)
nRBC: 1 % — ABNORMAL HIGH (ref 0–0)

## 2012-04-10 LAB — LACTATE DEHYDROGENASE: LDH: 159 U/L (ref 94–250)

## 2012-04-10 MED ORDER — DEXAMETHASONE SODIUM PHOSPHATE 10 MG/ML IJ SOLN
10.0000 mg | Freq: Once | INTRAMUSCULAR | Status: AC
  Administered 2012-04-10: 10 mg via INTRAVENOUS

## 2012-04-10 MED ORDER — SODIUM CHLORIDE 0.9 % IV SOLN
Freq: Once | INTRAVENOUS | Status: AC
  Administered 2012-04-10: 13:00:00 via INTRAVENOUS

## 2012-04-10 MED ORDER — ACETAMINOPHEN 325 MG PO TABS
650.0000 mg | ORAL_TABLET | Freq: Once | ORAL | Status: AC
  Administered 2012-04-10: 650 mg via ORAL

## 2012-04-10 MED ORDER — PROMETHAZINE HCL 25 MG PO TABS: 12.5000 mg | ORAL_TABLET | Freq: Four times a day (QID) | ORAL | Status: DC | PRN

## 2012-04-10 MED ORDER — DIPHENHYDRAMINE HCL 25 MG PO CAPS
50.0000 mg | ORAL_CAPSULE | Freq: Once | ORAL | Status: AC
  Administered 2012-04-10: 50 mg via ORAL

## 2012-04-10 MED ORDER — INFLUENZA VIRUS VACC SPLIT PF IM SUSP
0.5000 mL | Freq: Once | INTRAMUSCULAR | Status: AC
  Administered 2012-04-10: 0.5 mL via INTRAMUSCULAR
  Filled 2012-04-10: qty 0.5

## 2012-04-10 MED ORDER — SODIUM CHLORIDE 0.9 % IV SOLN
375.0000 mg/m2 | Freq: Once | INTRAVENOUS | Status: AC
  Administered 2012-04-10: 900 mg via INTRAVENOUS
  Filled 2012-04-10: qty 90

## 2012-04-10 MED ORDER — ONDANSETRON 8 MG/50ML IVPB (CHCC)
8.0000 mg | Freq: Once | INTRAVENOUS | Status: AC
  Administered 2012-04-10: 8 mg via INTRAVENOUS

## 2012-04-10 MED ORDER — SODIUM CHLORIDE 0.9 % IV SOLN
Freq: Once | INTRAVENOUS | Status: AC
  Administered 2012-04-10: 09:00:00 via INTRAVENOUS

## 2012-04-10 MED ORDER — SODIUM CHLORIDE 0.9 % IV SOLN
200.0000 mg | Freq: Once | INTRAVENOUS | Status: AC
  Administered 2012-04-10: 200 mg via INTRAVENOUS
  Filled 2012-04-10: qty 40

## 2012-04-10 NOTE — Patient Instructions (Addendum)
Nausea medications Take Phenergan 12.5 mg by mouth every 6 hours as needed for nausea.       Bendamustine Injection What is this medicine? BENDAMUSTINE (BEN da MUS teen) is an anti-cancer drug used to treat a certain type of leukemia. This medicine may be used for other purposes; ask your health care provider or pharmacist if you have questions. What should I tell my health care provider before I take this medicine? They need to know if you have any of these conditions: -kidney disease -liver disease -an unusual or allergic reaction to bendamustine, mannitol, other medicines, foods, dyes, or preservatives -pregnant or trying to get pregnant -breast-feeding How should I use this medicine? This medicine is for infusion into a vein. It is given by a health care professional in a hospital or clinic setting. Talk to your pediatrician regarding the use of this medicine in children. Special care may be needed. Overdosage: If you think you have taken too much of this medicine contact a poison control center or emergency room at once. NOTE: This medicine is only for you. Do not share this medicine with others. What if I miss a dose? It is important not to miss your dose. Call your doctor or health care professional if you are unable to keep an appointment. What may interact with this medicine? Do not take this medicine with any of the following medications: -clozapine This medicine may also interact with the following medications: -atazanavir -cimetidine -ciprofloxacin -enoxacin -fluvoxamine -medicines for seizures like carbamazepine and phenobarbital -mexiletine -rifampin -tacrine -thiabendazole -zileuton This list may not describe all possible interactions. Give your health care provider a list of all the medicines, herbs, non-prescription drugs, or dietary supplements you use. Also tell them if you smoke, drink alcohol, or use illegal drugs. Some items may interact with your  medicine. What should I watch for while using this medicine? Your condition will be monitored carefully while you are receiving this medicine. This drug may make you feel generally unwell. This is not uncommon, as chemotherapy can affect healthy cells as well as cancer cells. Report any side effects. Continue your course of treatment even though you feel ill unless your doctor tells you to stop. Call your doctor or health care professional for advice if you get a fever, chills or sore throat, or other symptoms of a cold or flu. Do not treat yourself. This drug decreases your body's ability to fight infections. Try to avoid being around people who are sick. This medicine may increase your risk to bruise or bleed. Call your doctor or health care professional if you notice any unusual bleeding. Be careful brushing and flossing your teeth or using a toothpick because you may get an infection or bleed more easily. If you have any dental work done, tell your dentist you are receiving this medicine. Avoid taking products that contain aspirin, acetaminophen, ibuprofen, naproxen, or ketoprofen unless instructed by your doctor. These medicines may hide a fever. Do not become pregnant while taking this medicine. Women should inform their doctor if they wish to become pregnant or think they might be pregnant. There is a potential for serious side effects to an unborn child. Men should inform their doctors if they wish to father a child. This medicine may lower sperm counts. Talk to your health care professional or pharmacist for more information. Do not breast-feed an infant while taking this medicine. What side effects may I notice from receiving this medicine? Side effects that you should report to your  doctor or health care professional as soon as possible: -allergic reactions like skin rash, itching or hives, swelling of the face, lips, or tongue -low blood counts - this medicine may decrease the number of white  blood cells, red blood cells and platelets. You may be at increased risk for infections and bleeding. -signs of infection - fever or chills, cough, sore throat, pain or difficulty passing urine -signs of decreased platelets or bleeding - bruising, pinpoint red spots on the skin, black, tarry stools, blood in the urine -signs of decreased red blood cells - unusually weak or tired, fainting spells, lightheadedness -trouble passing urine or change in the amount of urine Side effects that usually do not require medical attention (report to your doctor or health care professional if they continue or are bothersome): -diarrhea This list may not describe all possible side effects. Call your doctor for medical advice about side effects. You may report side effects to FDA at 1-800-FDA-1088. Where should I keep my medicine? This drug is given in a hospital or clinic and will not be stored at home. NOTE: This sheet is a summary. It may not cover all possible information. If you have questions about this medicine, talk to your doctor, pharmacist, or health care provider.  2012, Elsevier/[employer] Standard. (10/13/2007 3:58:27 PM)Rituximab injection What is this medicine? RITUXIMAB (ri TUX i mab) is a monoclonal antibody. This medicine changes the way the body's immune system works. It is used commonly to treat non-Hodgkin's lymphoma and other conditions. In cancer cells, this drug targets a specific protein within cancer cells and stops the cancer cells from growing. It is also used to treat rhuematoid arthritis (RA). In RA, this medicine slow the inflammatory process and help reduce joint pain and swelling. This medicine is often used with other cancer or arthritis medications. This medicine may be used for other purposes; ask your health care provider or pharmacist if you have questions. What should I tell my health care provider before I take this medicine? They need to know if you have any of these  conditions: -blood disorders -heart disease -history of hepatitis B -infection (especially a virus infection such as chickenpox, cold sores, or herpes) -irregular heartbeat -kidney disease -lung or breathing disease, like asthma -lupus -an unusual or allergic reaction to rituximab, mouse proteins, other medicines, foods, dyes, or preservatives -pregnant or trying to get pregnant -breast-feeding How should I use this medicine? This medicine is for infusion into a vein. It is administered in a hospital or clinic by a specially trained health care professional. A special MedGuide will be given to you by the pharmacist with each prescription and refill. Be sure to read this information carefully each time. Talk to your pediatrician regarding the use of this medicine in children. This medicine is not approved for use in children. Overdosage: If you think you have taken too much of this medicine contact a poison control center or emergency room at once. NOTE: This medicine is only for you. Do not share this medicine with others. What if I miss a dose? It is important not to miss a dose. Call your doctor or health care professional if you are unable to keep an appointment. What may interact with this medicine? -cisplatin -medicines for blood pressure -some other medicines for arthritis -vaccines This list may not describe all possible interactions. Give your health care provider a list of all the medicines, herbs, non-prescription drugs, or dietary supplements you use. Also tell them if you smoke, drink  alcohol, or use illegal drugs. Some items may interact with your medicine. What should I watch for while using this medicine? Report any side effects that you notice during your treatment right away, such as changes in your breathing, fever, chills, dizziness or lightheadedness. These effects are more common with the first dose. Visit your prescriber or health care professional for checks on your  progress. You will need to have regular blood work. Report any other side effects. The side effects of this medicine can continue after you finish your treatment. Continue your course of treatment even though you feel ill unless your doctor tells you to stop. Call your doctor or health care professional for advice if you get a fever, chills or sore throat, or other symptoms of a cold or flu. Do not treat yourself. This drug decreases your body's ability to fight infections. Try to avoid being around people who are sick. This medicine may increase your risk to bruise or bleed. Call your doctor or health care professional if you notice any unusual bleeding. Be careful brushing and flossing your teeth or using a toothpick because you may get an infection or bleed more easily. If you have any dental work done, tell your dentist you are receiving this medicine. Avoid taking products that contain aspirin, acetaminophen, ibuprofen, naproxen, or ketoprofen unless instructed by your doctor. These medicines may hide a fever. Do not become pregnant while taking this medicine. Women should inform their doctor if they wish to become pregnant or think they might be pregnant. There is a potential for serious side effects to an unborn child. Talk to your health care professional or pharmacist for more information. Do not breast-feed an infant while taking this medicine. What side effects may I notice from receiving this medicine? Side effects that you should report to your doctor or health care professional as soon as possible: -allergic reactions like skin rash, itching or hives, swelling of the face, lips, or tongue -low blood counts - this medicine may decrease the number of white blood cells, red blood cells and platelets. You may be at increased risk for infections and bleeding. -signs of infection - fever or chills, cough, sore throat, pain or difficulty passing urine -signs of decreased platelets or bleeding -  bruising, pinpoint red spots on the skin, black, tarry stools, blood in the urine -signs of decreased red blood cells - unusually weak or tired, fainting spells, lightheadedness -breathing problems -confused, not responsive -chest pain -fast, irregular heartbeat -feeling faint or lightheaded, falls -mouth sores -redness, blistering, peeling or loosening of the skin, including inside the mouth -stomach pain -swelling of the ankles, feet, or hands -trouble passing urine or change in the amount of urine Side effects that usually do not require medical attention (report to your doctor or other health care professional if they continue or are bothersome): -anxiety -headache -loss of appetite -muscle aches -nausea -night sweats This list may not describe all possible side effects. Call your doctor for medical advice about side effects. You may report side effects to FDA at 1-800-FDA-1088. Where should I keep my medicine? This drug is given in a hospital or clinic and will not be stored at home. NOTE: This sheet is a summary. It may not cover all possible information. If you have questions about this medicine, talk to your doctor, pharmacist, or health care provider.  2012, Elsevier/Gold Standard. (03/08/2008 2:04:59 PM)

## 2012-04-10 NOTE — Progress Notes (Signed)
Patient complains of nausea when he sat up.  Color pale.  BP 77/46 and pulse 57.  Laid patient back down.  Infused .9 NS  Wide open rate.

## 2012-04-11 ENCOUNTER — Ambulatory Visit: Payer: 59

## 2012-04-11 VITALS — BP 150/66 | HR 64 | Temp 98.1°F

## 2012-04-11 DIAGNOSIS — C9110 Chronic lymphocytic leukemia of B-cell type not having achieved remission: Secondary | ICD-10-CM

## 2012-04-11 MED ORDER — ONDANSETRON 8 MG/50ML IVPB (CHCC)
8.0000 mg | Freq: Once | INTRAVENOUS | Status: AC
  Administered 2012-04-11: 8 mg via INTRAVENOUS

## 2012-04-11 MED ORDER — SODIUM CHLORIDE 0.9 % IV SOLN
Freq: Once | INTRAVENOUS | Status: AC
  Administered 2012-04-11: 10:00:00 via INTRAVENOUS

## 2012-04-11 MED ORDER — DEXAMETHASONE SODIUM PHOSPHATE 10 MG/ML IJ SOLN
10.0000 mg | Freq: Once | INTRAMUSCULAR | Status: AC
  Administered 2012-04-11: 10 mg via INTRAVENOUS

## 2012-04-11 MED ORDER — SODIUM CHLORIDE 0.9 % IV SOLN
200.0000 mg | Freq: Once | INTRAVENOUS | Status: AC
  Administered 2012-04-11: 200 mg via INTRAVENOUS
  Filled 2012-04-11: qty 40

## 2012-04-11 NOTE — Patient Instructions (Addendum)
Bendamustine Injection What is this medicine? BENDAMUSTINE (BEN da MUS teen) is an anti-cancer drug used to treat a certain type of leukemia. This medicine may be used for other purposes; ask your health care provider or pharmacist if you have questions. What should I tell my health care provider before I take this medicine? They need to know if you have any of these conditions: -kidney disease -liver disease -an unusual or allergic reaction to bendamustine, mannitol, other medicines, foods, dyes, or preservatives -pregnant or trying to get pregnant -breast-feeding How should I use this medicine? This medicine is for infusion into a vein. It is given by a health care professional in a hospital or clinic setting. Talk to your pediatrician regarding the use of this medicine in children. Special care may be needed. Overdosage: If you think you have taken too much of this medicine contact a poison control center or emergency room at once. NOTE: This medicine is only for you. Do not share this medicine with others. What if I miss a dose? It is important not to miss your dose. Call your doctor or health care professional if you are unable to keep an appointment. What may interact with this medicine? Do not take this medicine with any of the following medications: -clozapine This medicine may also interact with the following medications: -atazanavir -cimetidine -ciprofloxacin -enoxacin -fluvoxamine -medicines for seizures like carbamazepine and phenobarbital -mexiletine -rifampin -tacrine -thiabendazole -zileuton This list may not describe all possible interactions. Give your health care provider a list of all the medicines, herbs, non-prescription drugs, or dietary supplements you use. Also tell them if you smoke, drink alcohol, or use illegal drugs. Some items may interact with your medicine. What should I watch for while using this medicine? Your condition will be monitored carefully  while you are receiving this medicine. This drug may make you feel generally unwell. This is not uncommon, as chemotherapy can affect healthy cells as well as cancer cells. Report any side effects. Continue your course of treatment even though you feel ill unless your doctor tells you to stop. Call your doctor or health care professional for advice if you get a fever, chills or sore throat, or other symptoms of a cold or flu. Do not treat yourself. This drug decreases your body's ability to fight infections. Try to avoid being around people who are sick. This medicine may increase your risk to bruise or bleed. Call your doctor or health care professional if you notice any unusual bleeding. Be careful brushing and flossing your teeth or using a toothpick because you may get an infection or bleed more easily. If you have any dental work done, tell your dentist you are receiving this medicine. Avoid taking products that contain aspirin, acetaminophen, ibuprofen, naproxen, or ketoprofen unless instructed by your doctor. These medicines may hide a fever. Do not become pregnant while taking this medicine. Women should inform their doctor if they wish to become pregnant or think they might be pregnant. There is a potential for serious side effects to an unborn child. Men should inform their doctors if they wish to father a child. This medicine may lower sperm counts. Talk to your health care professional or pharmacist for more information. Do not breast-feed an infant while taking this medicine. What side effects may I notice from receiving this medicine? Side effects that you should report to your doctor or health care professional as soon as possible: -allergic reactions like skin rash, itching or hives, swelling of the face,   lips, or tongue -low blood counts - this medicine may decrease the number of white blood cells, red blood cells and platelets. You may be at increased risk for infections and  bleeding. -signs of infection - fever or chills, cough, sore throat, pain or difficulty passing urine -signs of decreased platelets or bleeding - bruising, pinpoint red spots on the skin, black, tarry stools, blood in the urine -signs of decreased red blood cells - unusually weak or tired, fainting spells, lightheadedness -trouble passing urine or change in the amount of urine Side effects that usually do not require medical attention (report to your doctor or health care professional if they continue or are bothersome): -diarrhea This list may not describe all possible side effects. Call your doctor for medical advice about side effects. You may report side effects to FDA at 1-800-FDA-1088. Where should I keep my medicine? This drug is given in a hospital or clinic and will not be stored at home. NOTE: This sheet is a summary. It may not cover all possible information. If you have questions about this medicine, talk to your doctor, pharmacist, or health care provider.  2012, [employer]vier/Gold Standard. (10/13/2007 3:58:27 PM) 

## 2012-04-16 ENCOUNTER — Encounter: Payer: Self-pay | Admitting: Hematology & Oncology

## 2012-04-17 ENCOUNTER — Other Ambulatory Visit: Payer: Self-pay | Admitting: Hematology & Oncology

## 2012-05-08 ENCOUNTER — Ambulatory Visit (HOSPITAL_BASED_OUTPATIENT_CLINIC_OR_DEPARTMENT_OTHER): Payer: 59

## 2012-05-08 ENCOUNTER — Other Ambulatory Visit (HOSPITAL_BASED_OUTPATIENT_CLINIC_OR_DEPARTMENT_OTHER): Payer: 59 | Admitting: Lab

## 2012-05-08 ENCOUNTER — Ambulatory Visit (HOSPITAL_BASED_OUTPATIENT_CLINIC_OR_DEPARTMENT_OTHER): Payer: 59 | Admitting: Hematology & Oncology

## 2012-05-08 VITALS — BP 124/79 | HR 55 | Temp 96.9°F | Resp 20

## 2012-05-08 VITALS — BP 118/70 | HR 81 | Temp 97.7°F | Resp 18 | Ht 69.0 in | Wt 243.0 lb

## 2012-05-08 DIAGNOSIS — C9110 Chronic lymphocytic leukemia of B-cell type not having achieved remission: Secondary | ICD-10-CM

## 2012-05-08 DIAGNOSIS — Z5112 Encounter for antineoplastic immunotherapy: Secondary | ICD-10-CM

## 2012-05-08 DIAGNOSIS — Z5111 Encounter for antineoplastic chemotherapy: Secondary | ICD-10-CM

## 2012-05-08 LAB — CBC WITH DIFFERENTIAL (CANCER CENTER ONLY)
BASO#: 0.1 10*3/uL (ref 0.0–0.2)
BASO%: 2.2 % — ABNORMAL HIGH (ref 0.0–2.0)
EOS%: 3.3 % (ref 0.0–7.0)
Eosinophils Absolute: 0.1 10*3/uL (ref 0.0–0.5)
HCT: 40.8 % (ref 38.7–49.9)
HGB: 14.5 g/dL (ref 13.0–17.1)
LYMPH#: 0.7 10*3/uL — ABNORMAL LOW (ref 0.9–3.3)
LYMPH%: 19 % (ref 14.0–48.0)
MCH: 34.4 pg — ABNORMAL HIGH (ref 28.0–33.4)
MCHC: 35.5 g/dL (ref 32.0–35.9)
MCV: 97 fL (ref 82–98)
MONO#: 0.5 10*3/uL (ref 0.1–0.9)
MONO%: 13.6 % — ABNORMAL HIGH (ref 0.0–13.0)
NEUT#: 2.3 10*3/uL (ref 1.5–6.5)
NEUT%: 61.9 % (ref 40.0–80.0)
Platelets: 128 10*3/uL — ABNORMAL LOW (ref 145–400)
RBC: 4.21 10*6/uL (ref 4.20–5.70)
RDW: 12.4 % (ref 11.1–15.7)
WBC: 3.7 10*3/uL — ABNORMAL LOW (ref 4.0–10.0)

## 2012-05-08 LAB — COMPREHENSIVE METABOLIC PANEL
ALT: 8 U/L (ref 0–53)
AST: 13 U/L (ref 0–37)
Albumin: 4.1 g/dL (ref 3.5–5.2)
Alkaline Phosphatase: 66 U/L (ref 39–117)
BUN: 11 mg/dL (ref 6–23)
CO2: 26 mEq/L (ref 19–32)
Calcium: 9 mg/dL (ref 8.4–10.5)
Chloride: 105 mEq/L (ref 96–112)
Creatinine, Ser: 0.93 mg/dL (ref 0.50–1.35)
Glucose, Bld: 124 mg/dL — ABNORMAL HIGH (ref 70–99)
Potassium: 3.8 mEq/L (ref 3.5–5.3)
Sodium: 140 mEq/L (ref 135–145)
Total Bilirubin: 0.4 mg/dL (ref 0.3–1.2)
Total Protein: 6 g/dL (ref 6.0–8.3)

## 2012-05-08 LAB — LACTATE DEHYDROGENASE: LDH: 108 U/L (ref 94–250)

## 2012-05-08 MED ORDER — ACETAMINOPHEN 325 MG PO TABS
650.0000 mg | ORAL_TABLET | Freq: Once | ORAL | Status: AC
  Administered 2012-05-08: 650 mg via ORAL

## 2012-05-08 MED ORDER — SODIUM CHLORIDE 0.9 % IV SOLN
Freq: Once | INTRAVENOUS | Status: AC
  Administered 2012-05-08: 09:00:00 via INTRAVENOUS

## 2012-05-08 MED ORDER — SODIUM CHLORIDE 0.9 % IV SOLN
375.0000 mg/m2 | Freq: Once | INTRAVENOUS | Status: AC
  Administered 2012-05-08: 900 mg via INTRAVENOUS
  Filled 2012-05-08: qty 90

## 2012-05-08 MED ORDER — SODIUM CHLORIDE 0.9 % IV SOLN
200.0000 mg | Freq: Once | INTRAVENOUS | Status: AC
  Administered 2012-05-08: 200 mg via INTRAVENOUS
  Filled 2012-05-08: qty 40

## 2012-05-08 MED ORDER — DIPHENHYDRAMINE HCL 25 MG PO CAPS
50.0000 mg | ORAL_CAPSULE | Freq: Once | ORAL | Status: AC
  Administered 2012-05-08: 50 mg via ORAL

## 2012-05-08 MED ORDER — ONDANSETRON 8 MG/50ML IVPB (CHCC)
8.0000 mg | Freq: Once | INTRAVENOUS | Status: AC
  Administered 2012-05-08: 8 mg via INTRAVENOUS

## 2012-05-08 MED ORDER — DEXAMETHASONE SODIUM PHOSPHATE 10 MG/ML IJ SOLN
10.0000 mg | Freq: Once | INTRAMUSCULAR | Status: AC
  Administered 2012-05-08: 10 mg via INTRAVENOUS

## 2012-05-08 NOTE — Patient Instructions (Signed)
Rituximab injection What is this medicine? RITUXIMAB (ri TUX i mab) is a monoclonal antibody. This medicine changes the way the body's immune system works. It is used commonly to treat non-Hodgkin's lymphoma and other conditions. In cancer cells, this drug targets a specific protein within cancer cells and stops the cancer cells from growing. It is also used to treat rhuematoid arthritis (RA). In RA, this medicine slow the inflammatory process and help reduce joint pain and swelling. This medicine is often used with other cancer or arthritis medications. This medicine may be used for other purposes; ask your health care provider or pharmacist if you have questions. What should I tell my health care provider before I take this medicine? They need to know if you have any of these conditions: -blood disorders -heart disease -history of hepatitis B -infection (especially a virus infection such as chickenpox, cold sores, or herpes) -irregular heartbeat -kidney disease -lung or breathing disease, like asthma -lupus -an unusual or allergic reaction to rituximab, mouse proteins, other medicines, foods, dyes, or preservatives -pregnant or trying to get pregnant -breast-feeding How should I use this medicine? This medicine is for infusion into a vein. It is administered in a hospital or clinic by a specially trained health care professional. A special MedGuide will be given to you by the pharmacist with each prescription and refill. Be sure to read this information carefully each time. Talk to your pediatrician regarding the use of this medicine in children. This medicine is not approved for use in children. Overdosage: If you think you have taken too much of this medicine contact a poison control center or emergency room at once. NOTE: This medicine is only for you. Do not share this medicine with others. What if I miss a dose? It is important not to miss a dose. Call your doctor or health care  professional if you are unable to keep an appointment. What may interact with this medicine? -cisplatin -medicines for blood pressure -some other medicines for arthritis -vaccines This list may not describe all possible interactions. Give your health care provider a list of all the medicines, herbs, non-prescription drugs, or dietary supplements you use. Also tell them if you smoke, drink alcohol, or use illegal drugs. Some items may interact with your medicine. What should I watch for while using this medicine? Report any side effects that you notice during your treatment right away, such as changes in your breathing, fever, chills, dizziness or lightheadedness. These effects are more common with the first dose. Visit your prescriber or health care professional for checks on your progress. You will need to have regular blood work. Report any other side effects. The side effects of this medicine can continue after you finish your treatment. Continue your course of treatment even though you feel ill unless your doctor tells you to stop. Call your doctor or health care professional for advice if you get a fever, chills or sore throat, or other symptoms of a cold or flu. Do not treat yourself. This drug decreases your body's ability to fight infections. Try to avoid being around people who are sick. This medicine may increase your risk to bruise or bleed. Call your doctor or health care professional if you notice any unusual bleeding. Be careful brushing and flossing your teeth or using a toothpick because you may get an infection or bleed more easily. If you have any dental work done, tell your dentist you are receiving this medicine. Avoid taking products that contain aspirin, acetaminophen,   ibuprofen, naproxen, or ketoprofen unless instructed by your doctor. These medicines may hide a fever. Do not become pregnant while taking this medicine. Women should inform their doctor if they wish to become  pregnant or think they might be pregnant. There is a potential for serious side effects to an unborn child. Talk to your health care professional or pharmacist for more information. Do not breast-feed an infant while taking this medicine. What side effects may I notice from receiving this medicine? Side effects that you should report to your doctor or health care professional as soon as possible: -allergic reactions like skin rash, itching or hives, swelling of the face, lips, or tongue -low blood counts - this medicine may decrease the number of white blood cells, red blood cells and platelets. You may be at increased risk for infections and bleeding. -signs of infection - fever or chills, cough, sore throat, pain or difficulty passing urine -signs of decreased platelets or bleeding - bruising, pinpoint red spots on the skin, black, tarry stools, blood in the urine -signs of decreased red blood cells - unusually weak or tired, fainting spells, lightheadedness -breathing problems -confused, not responsive -chest pain -fast, irregular heartbeat -feeling faint or lightheaded, falls -mouth sores -redness, blistering, peeling or loosening of the skin, including inside the mouth -stomach pain -swelling of the ankles, feet, or hands -trouble passing urine or change in the amount of urine Side effects that usually do not require medical attention (report to your doctor or other health care professional if they continue or are bothersome): -anxiety -headache -loss of appetite -muscle aches -nausea -night sweats This list may not describe all possible side effects. Call your doctor for medical advice about side effects. You may report side effects to FDA at 1-800-FDA-1088. Where should I keep my medicine? This drug is given in a hospital or clinic and will not be stored at home. NOTE: This sheet is a summary. It may not cover all possible information. If you have questions about this medicine,  talk to your doctor, pharmacist, or health care provider.  2012, [employer]. (03/08/2008 2:04:59 PM)Bendamustine Injection What is this medicine? BENDAMUSTINE (BEN da MUS teen) is an anti-cancer drug used to treat a certain type of leukemia. This medicine may be used for other purposes; ask your health care provider or pharmacist if you have questions. What should I tell my health care provider before I take this medicine? They need to know if you have any of these conditions: -kidney disease -liver disease -an unusual or allergic reaction to bendamustine, mannitol, other medicines, foods, dyes, or preservatives -pregnant or trying to get pregnant -breast-feeding How should I use this medicine? This medicine is for infusion into a vein. It is given by a health care professional in a hospital or clinic setting. Talk to your pediatrician regarding the use of this medicine in children. Special care may be needed. Overdosage: If you think you have taken too much of this medicine contact a poison control center or emergency room at once. NOTE: This medicine is only for you. Do not share this medicine with others. What if I miss a dose? It is important not to miss your dose. Call your doctor or health care professional if you are unable to keep an appointment. What may interact with this medicine? Do not take this medicine with any of the following medications: -clozapine This medicine may also interact with the following medications: -atazanavir -cimetidine -ciprofloxacin -enoxacin -fluvoxamine -medicines for seizures like carbamazepine and   phenobarbital -mexiletine -rifampin -tacrine -thiabendazole -zileuton This list may not describe all possible interactions. Give your health care provider a list of all the medicines, herbs, non-prescription drugs, or dietary supplements you use. Also tell them if you smoke, drink alcohol, or use illegal drugs. Some items may interact with  your medicine. What should I watch for while using this medicine? Your condition will be monitored carefully while you are receiving this medicine. This drug may make you feel generally unwell. This is not uncommon, as chemotherapy can affect healthy cells as well as cancer cells. Report any side effects. Continue your course of treatment even though you feel ill unless your doctor tells you to stop. Call your doctor or health care professional for advice if you get a fever, chills or sore throat, or other symptoms of a cold or flu. Do not treat yourself. This drug decreases your body's ability to fight infections. Try to avoid being around people who are sick. This medicine may increase your risk to bruise or bleed. Call your doctor or health care professional if you notice any unusual bleeding. Be careful brushing and flossing your teeth or using a toothpick because you may get an infection or bleed more easily. If you have any dental work done, tell your dentist you are receiving this medicine. Avoid taking products that contain aspirin, acetaminophen, ibuprofen, naproxen, or ketoprofen unless instructed by your doctor. These medicines may hide a fever. Do not become pregnant while taking this medicine. Women should inform their doctor if they wish to become pregnant or think they might be pregnant. There is a potential for serious side effects to an unborn child. Men should inform their doctors if they wish to father a child. This medicine may lower sperm counts. Talk to your health care professional or pharmacist for more information. Do not breast-feed an infant while taking this medicine. What side effects may I notice from receiving this medicine? Side effects that you should report to your doctor or health care professional as soon as possible: -allergic reactions like skin rash, itching or hives, swelling of the face, lips, or tongue -low blood counts - this medicine may decrease the number of  white blood cells, red blood cells and platelets. You may be at increased risk for infections and bleeding. -signs of infection - fever or chills, cough, sore throat, pain or difficulty passing urine -signs of decreased platelets or bleeding - bruising, pinpoint red spots on the skin, black, tarry stools, blood in the urine -signs of decreased red blood cells - unusually weak or tired, fainting spells, lightheadedness -trouble passing urine or change in the amount of urine Side effects that usually do not require medical attention (report to your doctor or health care professional if they continue or are bothersome): -diarrhea This list may not describe all possible side effects. Call your doctor for medical advice about side effects. You may report side effects to FDA at 1-800-FDA-1088. Where should I keep my medicine? This drug is given in a hospital or clinic and will not be stored at home. NOTE: This sheet is a summary. It may not cover all possible information. If you have questions about this medicine, talk to your doctor, pharmacist, or health care provider.  2012, [employer] Standard. (10/13/2007 3:58:27 PM) 

## 2012-05-08 NOTE — Progress Notes (Signed)
This office note has been dictated.

## 2012-05-09 ENCOUNTER — Ambulatory Visit (HOSPITAL_BASED_OUTPATIENT_CLINIC_OR_DEPARTMENT_OTHER): Payer: 59

## 2012-05-09 VITALS — BP 126/80 | HR 58 | Temp 97.0°F | Resp 18

## 2012-05-09 DIAGNOSIS — C9110 Chronic lymphocytic leukemia of B-cell type not having achieved remission: Secondary | ICD-10-CM

## 2012-05-09 DIAGNOSIS — Z5111 Encounter for antineoplastic chemotherapy: Secondary | ICD-10-CM

## 2012-05-09 MED ORDER — SODIUM CHLORIDE 0.9 % IV SOLN
200.0000 mg | Freq: Once | INTRAVENOUS | Status: AC
  Administered 2012-05-09: 200 mg via INTRAVENOUS
  Filled 2012-05-09: qty 40

## 2012-05-09 MED ORDER — DEXAMETHASONE SODIUM PHOSPHATE 10 MG/ML IJ SOLN
10.0000 mg | Freq: Once | INTRAMUSCULAR | Status: AC
  Administered 2012-05-09: 10 mg via INTRAVENOUS

## 2012-05-09 MED ORDER — ONDANSETRON 8 MG/50ML IVPB (CHCC)
8.0000 mg | Freq: Once | INTRAVENOUS | Status: AC
  Administered 2012-05-09: 8 mg via INTRAVENOUS

## 2012-05-09 MED ORDER — SODIUM CHLORIDE 0.9 % IV SOLN
Freq: Once | INTRAVENOUS | Status: AC
  Administered 2012-05-09: 11:00:00 via INTRAVENOUS

## 2012-05-09 NOTE — Progress Notes (Signed)
CC:   [name], M.D.  DIAGNOSIS:  Chronic lymphocytic leukemia.  CURRENT THERAPY:  Status post cycle 1 of Treanda/Rituxan.  INTERIM HISTORY:  Charles [name] comes in for follow-up.  He tolerated his first cycle of chemotherapy incredibly well.  He did not miss a day of work.  He had a little bit of nausea, but no vomiting.  He ate quite nicely.  He has had no abdominal pain.  He has had no fevers.  He has had no cough or shortness of breath.  There is no change in bowel or bladder habits.  He did not note any rashes.  He had no arthralgias or myalgias.  PHYSICAL EXAMINATION:  General:  This is a well-developed, well- nourished white gentleman in no obvious distress.  Vital Signs: Temperature of 97.7, pulse 61, respiratory rate 18, blood pressure 118/70.  Weight is 243.  Head and Neck:  Normocephalic, atraumatic skull.  There are no ocular or oral lesions.  There are no palpable cervical or supraclavicular lymph nodes.  Lungs:  Clear bilaterally. Cardiac:  Regular rate and rhythm with a normal S1 and S2.  There are no murmurs, rubs, or bruits.  Abdomen:  Soft with good bowel sounds.  There is no palpable abdominal mass.  No fluid wave is noted.  There is no palpable hepatosplenomegaly.  Back:  No tenderness over the spine, ribs, or hips.  Axillary:  No bilateral axillary adenopathy.  Neurological: No focal neurological deficits.  LABORATORY STUDIES:  White cell count 3.7, hemoglobin 14.5, hematocrit 40.8, platelet count 128.  White cell differential shows 62 segs, 19 lymphs.  IMPRESSION:  Charles [name] is a very nice 56 year old gentleman with chronic lymphocytic leukemia.  He has had a very nice response to 1 cycle of chemotherapy.  I think because of the good response, we can probably go with 6 cycles of chemotherapy.  We will go ahead and plan for cycle #2 today.  We will go ahead and get him back in 1 more month for his third cycle.  Of note, we did do cytogenetics on  the bone marrow.  He had normal cytogenetics.  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    ______________________________ Josph Macho, M.D. PRE/MEDQ  D:  05/08/2012  T:  05/09/2012  Job:  [id number]

## 2012-05-21 ENCOUNTER — Encounter: Payer: Self-pay | Admitting: *Deleted

## 2012-05-21 NOTE — Letter (Signed)
May 19, 2012     NAME:  JADYN, BARGE MRN:  [id number] DOB:  October 21, 1955  Dear Milford Cage or Madam:  This letter is to help provide some assistance for private second class Lori Liew.  He is a son of my patient, Mr. Johnmichael Melhorn.  Mr. Vanvoorhis has leukemia.  He is undergoing aggressive chemotherapy for this.  In view of the diagnosis of leukemia, Mr. Ryker would want to see his son over the holidays while he is still healthy and not ill from his treatments.  As such, I would be most appreciative if private second class Virgel Haro can have a leave during the holidays so that he can be with his father.  It is important for Mr. Sudbury overall medical health for his family to be with him over the holidays.  I think this would aid in his recovery from treatment and also allow him to better tolerate his chemotherapy.  I anticipate Mr. Gater becoming weaker from his treatments as we go along.  He has treatments scheduled for the next 6-8 months.  As such, I believe that his son being with him during the holidays would be a meaningful visit that Mr. Whittley would be able to enjoy as he would not be too sick from his treatments.  If there is any further medical information that is necessary regarding Mr. Bramhall leukemia and his treatment, please feel free to call me at [phone].  Respectfully,    Josph Macho, M.D.  PRE/MEDQ  D:  05/19/2012  T:  05/20/2012  Job:  [id number]

## 2012-06-05 ENCOUNTER — Ambulatory Visit (HOSPITAL_BASED_OUTPATIENT_CLINIC_OR_DEPARTMENT_OTHER): Payer: 59 | Admitting: Hematology & Oncology

## 2012-06-05 ENCOUNTER — Other Ambulatory Visit (HOSPITAL_BASED_OUTPATIENT_CLINIC_OR_DEPARTMENT_OTHER): Payer: 59 | Admitting: Lab

## 2012-06-05 ENCOUNTER — Ambulatory Visit (HOSPITAL_BASED_OUTPATIENT_CLINIC_OR_DEPARTMENT_OTHER): Payer: 59

## 2012-06-05 VITALS — BP 141/75 | HR 63 | Temp 97.6°F | Resp 20 | Ht 69.0 in | Wt 248.0 lb

## 2012-06-05 VITALS — BP 140/87 | HR 54 | Temp 97.0°F | Resp 18

## 2012-06-05 DIAGNOSIS — C9110 Chronic lymphocytic leukemia of B-cell type not having achieved remission: Secondary | ICD-10-CM

## 2012-06-05 DIAGNOSIS — Z5111 Encounter for antineoplastic chemotherapy: Secondary | ICD-10-CM

## 2012-06-05 DIAGNOSIS — Z5112 Encounter for antineoplastic immunotherapy: Secondary | ICD-10-CM

## 2012-06-05 LAB — CBC WITH DIFFERENTIAL (CANCER CENTER ONLY)
BASO#: 0.3 10*3/uL — ABNORMAL HIGH (ref 0.0–0.2)
BASO%: 5.5 % — ABNORMAL HIGH (ref 0.0–2.0)
EOS%: 8.7 % — ABNORMAL HIGH (ref 0.0–7.0)
Eosinophils Absolute: 0.4 10*3/uL (ref 0.0–0.5)
HCT: 43.9 % (ref 38.7–49.9)
HGB: 15.8 g/dL (ref 13.0–17.1)
LYMPH#: 0.6 10*3/uL — ABNORMAL LOW (ref 0.9–3.3)
LYMPH%: 13.1 % — ABNORMAL LOW (ref 14.0–48.0)
MCH: 33.9 pg — ABNORMAL HIGH (ref 28.0–33.4)
MCHC: 36 g/dL — ABNORMAL HIGH (ref 32.0–35.9)
MCV: 94 fL (ref 82–98)
MONO#: 0.7 10*3/uL (ref 0.1–0.9)
MONO%: 14.6 % — ABNORMAL HIGH (ref 0.0–13.0)
NEUT#: 2.8 10*3/uL (ref 1.5–6.5)
NEUT%: 58.1 % (ref 40.0–80.0)
Platelets: 154 10*3/uL (ref 145–400)
RBC: 4.66 10*6/uL (ref 4.20–5.70)
RDW: 11.9 % (ref 11.1–15.7)
WBC: 4.7 10*3/uL (ref 4.0–10.0)

## 2012-06-05 LAB — COMPREHENSIVE METABOLIC PANEL
ALT: 15 U/L (ref 0–53)
AST: 22 U/L (ref 0–37)
Albumin: 4.5 g/dL (ref 3.5–5.2)
Alkaline Phosphatase: 74 U/L (ref 39–117)
BUN: 14 mg/dL (ref 6–23)
CO2: 24 mEq/L (ref 19–32)
Calcium: 9.5 mg/dL (ref 8.4–10.5)
Chloride: 103 mEq/L (ref 96–112)
Creatinine, Ser: 0.93 mg/dL (ref 0.50–1.35)
Glucose, Bld: 95 mg/dL (ref 70–99)
Potassium: 4.2 mEq/L (ref 3.5–5.3)
Sodium: 139 mEq/L (ref 135–145)
Total Bilirubin: 0.5 mg/dL (ref 0.3–1.2)
Total Protein: 6.4 g/dL (ref 6.0–8.3)

## 2012-06-05 LAB — CHCC SATELLITE - SMEAR

## 2012-06-05 LAB — LACTATE DEHYDROGENASE: LDH: 207 U/L (ref 94–250)

## 2012-06-05 MED ORDER — DIPHENHYDRAMINE HCL 25 MG PO CAPS
50.0000 mg | ORAL_CAPSULE | Freq: Once | ORAL | Status: AC
  Administered 2012-06-05: 50 mg via ORAL

## 2012-06-05 MED ORDER — SODIUM CHLORIDE 0.9 % IV SOLN
200.0000 mg | Freq: Once | INTRAVENOUS | Status: AC
  Administered 2012-06-05: 200 mg via INTRAVENOUS
  Filled 2012-06-05: qty 40

## 2012-06-05 MED ORDER — ONDANSETRON 8 MG/50ML IVPB (CHCC)
8.0000 mg | Freq: Once | INTRAVENOUS | Status: AC
  Administered 2012-06-05: 8 mg via INTRAVENOUS

## 2012-06-05 MED ORDER — SODIUM CHLORIDE 0.9 % IV SOLN
Freq: Once | INTRAVENOUS | Status: AC
  Administered 2012-06-05: 10:00:00 via INTRAVENOUS

## 2012-06-05 MED ORDER — SODIUM CHLORIDE 0.9 % IV SOLN
375.0000 mg/m2 | Freq: Once | INTRAVENOUS | Status: AC
  Administered 2012-06-05: 900 mg via INTRAVENOUS
  Filled 2012-06-05: qty 90

## 2012-06-05 MED ORDER — DEXAMETHASONE SODIUM PHOSPHATE 10 MG/ML IJ SOLN
10.0000 mg | Freq: Once | INTRAMUSCULAR | Status: AC
  Administered 2012-06-05: 10 mg via INTRAVENOUS

## 2012-06-05 MED ORDER — ACETAMINOPHEN 325 MG PO TABS
650.0000 mg | ORAL_TABLET | Freq: Once | ORAL | Status: AC
  Administered 2012-06-05: 650 mg via ORAL

## 2012-06-05 NOTE — Progress Notes (Signed)
This office note has been dictated.

## 2012-06-05 NOTE — Patient Instructions (Signed)
Rituximab injection What is this medicine? RITUXIMAB (ri TUX i mab) is a monoclonal antibody. This medicine changes the way the body's immune system works. It is used commonly to treat non-Hodgkin's lymphoma and other conditions. In cancer cells, this drug targets a specific protein within cancer cells and stops the cancer cells from growing. It is also used to treat rhuematoid arthritis (RA). In RA, this medicine slow the inflammatory process and help reduce joint pain and swelling. This medicine is often used with other cancer or arthritis medications. This medicine may be used for other purposes; ask your health care provider or pharmacist if you have questions. What should I tell my health care provider before I take this medicine? They need to know if you have any of these conditions: -blood disorders -heart disease -history of hepatitis B -infection (especially a virus infection such as chickenpox, cold sores, or herpes) -irregular heartbeat -kidney disease -lung or breathing disease, like asthma -lupus -an unusual or allergic reaction to rituximab, mouse proteins, other medicines, foods, dyes, or preservatives -pregnant or trying to get pregnant -breast-feeding How should I use this medicine? This medicine is for infusion into a vein. It is administered in a hospital or clinic by a specially trained health care professional. A special MedGuide will be given to you by the pharmacist with each prescription and refill. Be sure to read this information carefully each time. Talk to your pediatrician regarding the use of this medicine in children. This medicine is not approved for use in children. Overdosage: If you think you have taken too much of this medicine contact a poison control center or emergency room at once. NOTE: This medicine is only for you. Do not share this medicine with others. What if I miss a dose? It is important not to miss a dose. Call your doctor or health care  professional if you are unable to keep an appointment. What may interact with this medicine? -cisplatin -medicines for blood pressure -some other medicines for arthritis -vaccines This list may not describe all possible interactions. Give your health care provider a list of all the medicines, herbs, non-prescription drugs, or dietary supplements you use. Also tell them if you smoke, drink alcohol, or use illegal drugs. Some items may interact with your medicine. What should I watch for while using this medicine? Report any side effects that you notice during your treatment right away, such as changes in your breathing, fever, chills, dizziness or lightheadedness. These effects are more common with the first dose. Visit your prescriber or health care professional for checks on your progress. You will need to have regular blood work. Report any other side effects. The side effects of this medicine can continue after you finish your treatment. Continue your course of treatment even though you feel ill unless your doctor tells you to stop. Call your doctor or health care professional for advice if you get a fever, chills or sore throat, or other symptoms of a cold or flu. Do not treat yourself. This drug decreases your body's ability to fight infections. Try to avoid being around people who are sick. This medicine may increase your risk to bruise or bleed. Call your doctor or health care professional if you notice any unusual bleeding. Be careful brushing and flossing your teeth or using a toothpick because you may get an infection or bleed more easily. If you have any dental work done, tell your dentist you are receiving this medicine. Avoid taking products that contain aspirin, acetaminophen,   ibuprofen, naproxen, or ketoprofen unless instructed by your doctor. These medicines may hide a fever. Do not become pregnant while taking this medicine. Women should inform their doctor if they wish to become  pregnant or think they might be pregnant. There is a potential for serious side effects to an unborn child. Talk to your health care professional or pharmacist for more information. Do not breast-feed an infant while taking this medicine. What side effects may I notice from receiving this medicine? Side effects that you should report to your doctor or health care professional as soon as possible: -allergic reactions like skin rash, itching or hives, swelling of the face, lips, or tongue -low blood counts - this medicine may decrease the number of white blood cells, red blood cells and platelets. You may be at increased risk for infections and bleeding. -signs of infection - fever or chills, cough, sore throat, pain or difficulty passing urine -signs of decreased platelets or bleeding - bruising, pinpoint red spots on the skin, black, tarry stools, blood in the urine -signs of decreased red blood cells - unusually weak or tired, fainting spells, lightheadedness -breathing problems -confused, not responsive -chest pain -fast, irregular heartbeat -feeling faint or lightheaded, falls -mouth sores -redness, blistering, peeling or loosening of the skin, including inside the mouth -stomach pain -swelling of the ankles, feet, or hands -trouble passing urine or change in the amount of urine Side effects that usually do not require medical attention (report to your doctor or other health care professional if they continue or are bothersome): -anxiety -headache -loss of appetite -muscle aches -nausea -night sweats This list may not describe all possible side effects. Call your doctor for medical advice about side effects. You may report side effects to FDA at 1-800-FDA-1088. Where should I keep my medicine? This drug is given in a hospital or clinic and will not be stored at home. NOTE: This sheet is a summary. It may not cover all possible information. If you have questions about this medicine,  talk to your doctor, pharmacist, or health care provider.  2012, [employer]. (03/08/2008 2:04:59 PM)Bendamustine Injection What is this medicine? BENDAMUSTINE (BEN da MUS teen) is an anti-cancer drug used to treat a certain type of leukemia. This medicine may be used for other purposes; ask your health care provider or pharmacist if you have questions. What should I tell my health care provider before I take this medicine? They need to know if you have any of these conditions: -kidney disease -liver disease -an unusual or allergic reaction to bendamustine, mannitol, other medicines, foods, dyes, or preservatives -pregnant or trying to get pregnant -breast-feeding How should I use this medicine? This medicine is for infusion into a vein. It is given by a health care professional in a hospital or clinic setting. Talk to your pediatrician regarding the use of this medicine in children. Special care may be needed. Overdosage: If you think you have taken too much of this medicine contact a poison control center or emergency room at once. NOTE: This medicine is only for you. Do not share this medicine with others. What if I miss a dose? It is important not to miss your dose. Call your doctor or health care professional if you are unable to keep an appointment. What may interact with this medicine? Do not take this medicine with any of the following medications: -clozapine This medicine may also interact with the following medications: -atazanavir -cimetidine -ciprofloxacin -enoxacin -fluvoxamine -medicines for seizures like carbamazepine and   phenobarbital -mexiletine -rifampin -tacrine -thiabendazole -zileuton This list may not describe all possible interactions. Give your health care provider a list of all the medicines, herbs, non-prescription drugs, or dietary supplements you use. Also tell them if you smoke, drink alcohol, or use illegal drugs. Some items may interact with  your medicine. What should I watch for while using this medicine? Your condition will be monitored carefully while you are receiving this medicine. This drug may make you feel generally unwell. This is not uncommon, as chemotherapy can affect healthy cells as well as cancer cells. Report any side effects. Continue your course of treatment even though you feel ill unless your doctor tells you to stop. Call your doctor or health care professional for advice if you get a fever, chills or sore throat, or other symptoms of a cold or flu. Do not treat yourself. This drug decreases your body's ability to fight infections. Try to avoid being around people who are sick. This medicine may increase your risk to bruise or bleed. Call your doctor or health care professional if you notice any unusual bleeding. Be careful brushing and flossing your teeth or using a toothpick because you may get an infection or bleed more easily. If you have any dental work done, tell your dentist you are receiving this medicine. Avoid taking products that contain aspirin, acetaminophen, ibuprofen, naproxen, or ketoprofen unless instructed by your doctor. These medicines may hide a fever. Do not become pregnant while taking this medicine. Women should inform their doctor if they wish to become pregnant or think they might be pregnant. There is a potential for serious side effects to an unborn child. Men should inform their doctors if they wish to father a child. This medicine may lower sperm counts. Talk to your health care professional or pharmacist for more information. Do not breast-feed an infant while taking this medicine. What side effects may I notice from receiving this medicine? Side effects that you should report to your doctor or health care professional as soon as possible: -allergic reactions like skin rash, itching or hives, swelling of the face, lips, or tongue -low blood counts - this medicine may decrease the number of  white blood cells, red blood cells and platelets. You may be at increased risk for infections and bleeding. -signs of infection - fever or chills, cough, sore throat, pain or difficulty passing urine -signs of decreased platelets or bleeding - bruising, pinpoint red spots on the skin, black, tarry stools, blood in the urine -signs of decreased red blood cells - unusually weak or tired, fainting spells, lightheadedness -trouble passing urine or change in the amount of urine Side effects that usually do not require medical attention (report to your doctor or health care professional if they continue or are bothersome): -diarrhea This list may not describe all possible side effects. Call your doctor for medical advice about side effects. You may report side effects to FDA at 1-800-FDA-1088. Where should I keep my medicine? This drug is given in a hospital or clinic and will not be stored at home. NOTE: This sheet is a summary. It may not cover all possible information. If you have questions about this medicine, talk to your doctor, pharmacist, or health care provider.  2012, [employer] Standard. (10/13/2007 3:58:27 PM) 

## 2012-06-05 NOTE — Progress Notes (Signed)
CC:   [name], M.D.  DIAGNOSIS:  Chronic lymphocytic leukemia, stage C.  CURRENT THERAPY:  Patient status post 2 cycles of Treanda/Rituxan.  INTERIM HISTORY:  Mr. Boettner comes in for his followup.  He is doing great.  He is still working full-time.  He has not missed any work.  He had some hiccups after the chemotherapy.  This actually may be his antiemetics.  He has not noticed any palpable lymph glands.  He has had no abdominal pain.  There has been no change in bowel or bladder habits.  He has not noticed any rashes.  PHYSICAL EXAMINATION:  General:  This is a well-developed, well- nourished white gentleman in no obvious distress.  Vital Signs: 97.3, pulse 63, respiratory rate 20, blood pressure 141/75.  Weight is 248. Head and Neck Exam:  Shows a normocephalic, atraumatic skull.  There are no ocular or oral lesions.  There are no palpable cervical or supraclavicular lymph nodes.  Lungs:  Clear to percussion and auscultation bilaterally.  Cardiac Exam:  Regular rate and rhythm with a normal S1, S2.  There are no murmurs, rubs or bruits.  Abdominal Exam: Soft with good bowel sounds.  There is no palpable abdominal mass. There is no fluid wave.  There is no palpable hepatosplenomegaly. Axillary Exam:  Shows no bilateral axillary adenopathy.  Extremities: Shows no clubbing, cyanosis or edema.  Back Exam:  No tenderness over the spine, ribs, or hips.  Neurological Exam:  No focal neurological deficits.  LABORATORY STUDIES:  White count is 4.7, hemoglobin 15.8, hematocrit 47 and platelet count 154.  IMPRESSION:  Mr. Mendenhall is a 56 year old gentleman with chronic lymphocytic leukemia.  He has stage C disease.  He has responded incredibly well.  After 2 cycles, his counts have normalized.  Again, he does have, I think, a 13q- deletion.  This is a favorable cytogenetic abnormality.  We will plan for another treatment in 4  weeks.    ______________________________ Josph Macho, M.D. PRE/MEDQ  D:  06/05/2012  T:  06/05/2012  Job:  [id number]

## 2012-06-06 ENCOUNTER — Ambulatory Visit (HOSPITAL_BASED_OUTPATIENT_CLINIC_OR_DEPARTMENT_OTHER): Payer: 59

## 2012-06-06 VITALS — BP 148/75 | HR 71 | Temp 96.7°F | Resp 18

## 2012-06-06 DIAGNOSIS — Z5111 Encounter for antineoplastic chemotherapy: Secondary | ICD-10-CM

## 2012-06-06 DIAGNOSIS — C9110 Chronic lymphocytic leukemia of B-cell type not having achieved remission: Secondary | ICD-10-CM

## 2012-06-06 MED ORDER — ONDANSETRON 8 MG/50ML IVPB (CHCC)
8.0000 mg | Freq: Once | INTRAVENOUS | Status: AC
  Administered 2012-06-06: 8 mg via INTRAVENOUS

## 2012-06-06 MED ORDER — DEXAMETHASONE SODIUM PHOSPHATE 10 MG/ML IJ SOLN
10.0000 mg | Freq: Once | INTRAMUSCULAR | Status: AC
  Administered 2012-06-06: 10 mg via INTRAVENOUS

## 2012-06-06 MED ORDER — SODIUM CHLORIDE 0.9 % IV SOLN
200.0000 mg | Freq: Once | INTRAVENOUS | Status: AC
  Administered 2012-06-06: 200 mg via INTRAVENOUS
  Filled 2012-06-06: qty 40

## 2012-06-06 MED ORDER — SODIUM CHLORIDE 0.9 % IV SOLN
Freq: Once | INTRAVENOUS | Status: AC
  Administered 2012-06-06: 11:00:00 via INTRAVENOUS

## 2012-06-06 NOTE — Patient Instructions (Signed)
Bendamustine Injection What is this medicine? BENDAMUSTINE (BEN da MUS teen) is an anti-cancer drug used to treat a certain type of leukemia. This medicine may be used for other purposes; ask your health care provider or pharmacist if you have questions. What should I tell my health care provider before I take this medicine? They need to know if you have any of these conditions: -kidney disease -liver disease -an unusual or allergic reaction to bendamustine, mannitol, other medicines, foods, dyes, or preservatives -pregnant or trying to get pregnant -breast-feeding How should I use this medicine? This medicine is for infusion into a vein. It is given by a health care professional in a hospital or clinic setting. Talk to your pediatrician regarding the use of this medicine in children. Special care may be needed. Overdosage: If you think you have taken too much of this medicine contact a poison control center or emergency room at once. NOTE: This medicine is only for you. Do not share this medicine with others. What if I miss a dose? It is important not to miss your dose. Call your doctor or health care professional if you are unable to keep an appointment. What may interact with this medicine? Do not take this medicine with any of the following medications: -clozapine This medicine may also interact with the following medications: -atazanavir -cimetidine -ciprofloxacin -enoxacin -fluvoxamine -medicines for seizures like carbamazepine and phenobarbital -mexiletine -rifampin -tacrine -thiabendazole -zileuton This list may not describe all possible interactions. Give your health care provider a list of all the medicines, herbs, non-prescription drugs, or dietary supplements you use. Also tell them if you smoke, drink alcohol, or use illegal drugs. Some items may interact with your medicine. What should I watch for while using this medicine? Your condition will be monitored carefully  while you are receiving this medicine. This drug may make you feel generally unwell. This is not uncommon, as chemotherapy can affect healthy cells as well as cancer cells. Report any side effects. Continue your course of treatment even though you feel ill unless your doctor tells you to stop. Call your doctor or health care professional for advice if you get a fever, chills or sore throat, or other symptoms of a cold or flu. Do not treat yourself. This drug decreases your body's ability to fight infections. Try to avoid being around people who are sick. This medicine may increase your risk to bruise or bleed. Call your doctor or health care professional if you notice any unusual bleeding. Be careful brushing and flossing your teeth or using a toothpick because you may get an infection or bleed more easily. If you have any dental work done, tell your dentist you are receiving this medicine. Avoid taking products that contain aspirin, acetaminophen, ibuprofen, naproxen, or ketoprofen unless instructed by your doctor. These medicines may hide a fever. Do not become pregnant while taking this medicine. Women should inform their doctor if they wish to become pregnant or think they might be pregnant. There is a potential for serious side effects to an unborn child. Men should inform their doctors if they wish to father a child. This medicine may lower sperm counts. Talk to your health care professional or pharmacist for more information. Do not breast-feed an infant while taking this medicine. What side effects may I notice from receiving this medicine? Side effects that you should report to your doctor or health care professional as soon as possible: -allergic reactions like skin rash, itching or hives, swelling of the face,   lips, or tongue -low blood counts - this medicine may decrease the number of white blood cells, red blood cells and platelets. You may be at increased risk for infections and  bleeding. -signs of infection - fever or chills, cough, sore throat, pain or difficulty passing urine -signs of decreased platelets or bleeding - bruising, pinpoint red spots on the skin, black, tarry stools, blood in the urine -signs of decreased red blood cells - unusually weak or tired, fainting spells, lightheadedness -trouble passing urine or change in the amount of urine Side effects that usually do not require medical attention (report to your doctor or health care professional if they continue or are bothersome): -diarrhea This list may not describe all possible side effects. Call your doctor for medical advice about side effects. You may report side effects to FDA at 1-800-FDA-1088. Where should I keep my medicine? This drug is given in a hospital or clinic and will not be stored at home. NOTE: This sheet is a summary. It may not cover all possible information. If you have questions about this medicine, talk to your doctor, pharmacist, or health care provider.  2012, [employer] Standard. (10/13/2007 3:58:27 PM) 

## 2012-06-12 ENCOUNTER — Other Ambulatory Visit: Payer: Self-pay | Admitting: Hematology & Oncology

## 2012-07-02 ENCOUNTER — Other Ambulatory Visit: Payer: Self-pay | Admitting: *Deleted

## 2012-07-03 ENCOUNTER — Ambulatory Visit (HOSPITAL_BASED_OUTPATIENT_CLINIC_OR_DEPARTMENT_OTHER): Payer: 59 | Admitting: Hematology & Oncology

## 2012-07-03 ENCOUNTER — Other Ambulatory Visit (HOSPITAL_BASED_OUTPATIENT_CLINIC_OR_DEPARTMENT_OTHER): Payer: 59 | Admitting: Lab

## 2012-07-03 ENCOUNTER — Ambulatory Visit (HOSPITAL_BASED_OUTPATIENT_CLINIC_OR_DEPARTMENT_OTHER): Payer: 59

## 2012-07-03 VITALS — BP 123/70 | HR 61 | Temp 97.6°F | Resp 18 | Ht 69.0 in | Wt 251.0 lb

## 2012-07-03 VITALS — BP 125/77 | HR 51 | Temp 97.1°F | Resp 20

## 2012-07-03 DIAGNOSIS — C9110 Chronic lymphocytic leukemia of B-cell type not having achieved remission: Secondary | ICD-10-CM

## 2012-07-03 DIAGNOSIS — Z5111 Encounter for antineoplastic chemotherapy: Secondary | ICD-10-CM

## 2012-07-03 DIAGNOSIS — Z5112 Encounter for antineoplastic immunotherapy: Secondary | ICD-10-CM

## 2012-07-03 LAB — COMPREHENSIVE METABOLIC PANEL
ALT: 15 U/L (ref 0–53)
AST: 23 U/L (ref 0–37)
Albumin: 4.2 g/dL (ref 3.5–5.2)
Alkaline Phosphatase: 86 U/L (ref 39–117)
BUN: 12 mg/dL (ref 6–23)
CO2: 21 mEq/L (ref 19–32)
Calcium: 9 mg/dL (ref 8.4–10.5)
Chloride: 103 mEq/L (ref 96–112)
Creatinine, Ser: 0.95 mg/dL (ref 0.50–1.35)
Glucose, Bld: 75 mg/dL (ref 70–99)
Potassium: 4.2 mEq/L (ref 3.5–5.3)
Sodium: 140 mEq/L (ref 135–145)
Total Bilirubin: 0.4 mg/dL (ref 0.3–1.2)
Total Protein: 6.3 g/dL (ref 6.0–8.3)

## 2012-07-03 LAB — MANUAL DIFFERENTIAL (CHCC SATELLITE)
ALC: 0.7 10*3/uL — ABNORMAL LOW (ref 0.9–3.3)
ANC (CHCC HP manual diff): 1.6 10*3/uL (ref 1.5–6.5)
BASO: 4 % — ABNORMAL HIGH (ref 0–2)
Eos: 6 % (ref 0–7)
LYMPH: 24 % (ref 14–48)
MONO: 9 % (ref 0–13)
PLT EST ~~LOC~~: ADEQUATE
SEG: 57 % (ref 40–75)

## 2012-07-03 LAB — CBC WITH DIFFERENTIAL (CANCER CENTER ONLY)
HCT: 42.3 % (ref 38.7–49.9)
HGB: 15.2 g/dL (ref 13.0–17.1)
MCH: 33.3 pg (ref 28.0–33.4)
MCHC: 35.9 g/dL (ref 32.0–35.9)
MCV: 93 fL (ref 82–98)
Platelets: 177 10*3/uL (ref 145–400)
RBC: 4.57 10*6/uL (ref 4.20–5.70)
RDW: 12.1 % (ref 11.1–15.7)
WBC: 2.9 10*3/uL — ABNORMAL LOW (ref 4.0–10.0)

## 2012-07-03 LAB — CHCC SATELLITE - SMEAR

## 2012-07-03 LAB — LACTATE DEHYDROGENASE: LDH: 208 U/L (ref 94–250)

## 2012-07-03 MED ORDER — DIPHENHYDRAMINE HCL 25 MG PO CAPS
50.0000 mg | ORAL_CAPSULE | Freq: Once | ORAL | Status: AC
  Administered 2012-07-03: 50 mg via ORAL

## 2012-07-03 MED ORDER — SODIUM CHLORIDE 0.9 % IV SOLN
375.0000 mg/m2 | Freq: Once | INTRAVENOUS | Status: AC
  Administered 2012-07-03: 900 mg via INTRAVENOUS
  Filled 2012-07-03: qty 90

## 2012-07-03 MED ORDER — SODIUM CHLORIDE 0.9 % IV SOLN
88.0000 mg/m2 | Freq: Once | INTRAVENOUS | Status: AC
  Administered 2012-07-03: 200 mg via INTRAVENOUS
  Filled 2012-07-03: qty 40

## 2012-07-03 MED ORDER — SODIUM CHLORIDE 0.9 % IV SOLN
Freq: Once | INTRAVENOUS | Status: AC
  Administered 2012-07-03: 09:00:00 via INTRAVENOUS

## 2012-07-03 MED ORDER — ACETAMINOPHEN 325 MG PO TABS
650.0000 mg | ORAL_TABLET | Freq: Once | ORAL | Status: AC
  Administered 2012-07-03: 650 mg via ORAL

## 2012-07-03 MED ORDER — DEXAMETHASONE SODIUM PHOSPHATE 10 MG/ML IJ SOLN
10.0000 mg | Freq: Once | INTRAMUSCULAR | Status: AC
  Administered 2012-07-03: 10 mg via INTRAVENOUS

## 2012-07-03 MED ORDER — ONDANSETRON 8 MG/50ML IVPB (CHCC)
8.0000 mg | Freq: Once | INTRAVENOUS | Status: AC
  Administered 2012-07-03: 8 mg via INTRAVENOUS

## 2012-07-03 NOTE — Progress Notes (Signed)
This office note has been dictated.

## 2012-07-03 NOTE — Patient Instructions (Addendum)
Rituximab injection What is this medicine? RITUXIMAB (ri TUX i mab) is a monoclonal antibody. This medicine changes the way the body's immune system works. It is used commonly to treat non-Hodgkin's lymphoma and other conditions. In cancer cells, this drug targets a specific protein within cancer cells and stops the cancer cells from growing. It is also used to treat rhuematoid arthritis (RA). In RA, this medicine slow the inflammatory process and help reduce joint pain and swelling. This medicine is often used with other cancer or arthritis medications. This medicine may be used for other purposes; ask your health care provider or pharmacist if you have questions. What should I tell my health care provider before I take this medicine? They need to know if you have any of these conditions: -blood disorders -heart disease -history of hepatitis B -infection (especially a virus infection such as chickenpox, cold sores, or herpes) -irregular heartbeat -kidney disease -lung or breathing disease, like asthma -lupus -an unusual or allergic reaction to rituximab, mouse proteins, other medicines, foods, dyes, or preservatives -pregnant or trying to get pregnant -breast-feeding How should I use this medicine? This medicine is for infusion into a vein. It is administered in a hospital or clinic by a specially trained health care professional. A special MedGuide will be given to you by the pharmacist with each prescription and refill. Be sure to read this information carefully each time. Talk to your pediatrician regarding the use of this medicine in children. This medicine is not approved for use in children. Overdosage: If you think you have taken too much of this medicine contact a poison control center or emergency room at once. NOTE: This medicine is only for you. Do not share this medicine with others. What if I miss a dose? It is important not to miss a dose. Call your doctor or health care  professional if you are unable to keep an appointment. What may interact with this medicine? -cisplatin -medicines for blood pressure -some other medicines for arthritis -vaccines This list may not describe all possible interactions. Give your health care provider a list of all the medicines, herbs, non-prescription drugs, or dietary supplements you use. Also tell them if you smoke, drink alcohol, or use illegal drugs. Some items may interact with your medicine. What should I watch for while using this medicine? Report any side effects that you notice during your treatment right away, such as changes in your breathing, fever, chills, dizziness or lightheadedness. These effects are more common with the first dose. Visit your prescriber or health care professional for checks on your progress. You will need to have regular blood work. Report any other side effects. The side effects of this medicine can continue after you finish your treatment. Continue your course of treatment even though you feel ill unless your doctor tells you to stop. Call your doctor or health care professional for advice if you get a fever, chills or sore throat, or other symptoms of a cold or flu. Do not treat yourself. This drug decreases your body's ability to fight infections. Try to avoid being around people who are sick. This medicine may increase your risk to bruise or bleed. Call your doctor or health care professional if you notice any unusual bleeding. Be careful brushing and flossing your teeth or using a toothpick because you may get an infection or bleed more easily. If you have any dental work done, tell your dentist you are receiving this medicine. Avoid taking products that contain aspirin, acetaminophen,   ibuprofen, naproxen, or ketoprofen unless instructed by your doctor. These medicines may hide a fever. Do not become pregnant while taking this medicine. Women should inform their doctor if they wish to become  pregnant or think they might be pregnant. There is a potential for serious side effects to an unborn child. Talk to your health care professional or pharmacist for more information. Do not breast-feed an infant while taking this medicine. What side effects may I notice from receiving this medicine? Side effects that you should report to your doctor or health care professional as soon as possible: -allergic reactions like skin rash, itching or hives, swelling of the face, lips, or tongue -low blood counts - this medicine may decrease the number of white blood cells, red blood cells and platelets. You may be at increased risk for infections and bleeding. -signs of infection - fever or chills, cough, sore throat, pain or difficulty passing urine -signs of decreased platelets or bleeding - bruising, pinpoint red spots on the skin, black, tarry stools, blood in the urine -signs of decreased red blood cells - unusually weak or tired, fainting spells, lightheadedness -breathing problems -confused, not responsive -chest pain -fast, irregular heartbeat -feeling faint or lightheaded, falls -mouth sores -redness, blistering, peeling or loosening of the skin, including inside the mouth -stomach pain -swelling of the ankles, feet, or hands -trouble passing urine or change in the amount of urine Side effects that usually do not require medical attention (report to your doctor or other health care professional if they continue or are bothersome): -anxiety -headache -loss of appetite -muscle aches -nausea -night sweats This list may not describe all possible side effects. Call your doctor for medical advice about side effects. You may report side effects to FDA at 1-800-FDA-1088. Where should I keep my medicine? This drug is given in a hospital or clinic and will not be stored at home. NOTE: This sheet is a summary. It may not cover all possible information. If you have questions about this medicine,  talk to your doctor, pharmacist, or health care provider.  2012, [employer]. (03/08/2008 2:04:59 PM)Bendamustine Injection What is this medicine? BENDAMUSTINE (BEN da MUS teen) is an anti-cancer drug used to treat a certain type of leukemia. This medicine may be used for other purposes; ask your health care provider or pharmacist if you have questions. What should I tell my health care provider before I take this medicine? They need to know if you have any of these conditions: -kidney disease -liver disease -an unusual or allergic reaction to bendamustine, mannitol, other medicines, foods, dyes, or preservatives -pregnant or trying to get pregnant -breast-feeding How should I use this medicine? This medicine is for infusion into a vein. It is given by a health care professional in a hospital or clinic setting. Talk to your pediatrician regarding the use of this medicine in children. Special care may be needed. Overdosage: If you think you have taken too much of this medicine contact a poison control center or emergency room at once. NOTE: This medicine is only for you. Do not share this medicine with others. What if I miss a dose? It is important not to miss your dose. Call your doctor or health care professional if you are unable to keep an appointment. What may interact with this medicine? Do not take this medicine with any of the following medications: -clozapine This medicine may also interact with the following medications: -atazanavir -cimetidine -ciprofloxacin -enoxacin -fluvoxamine -medicines for seizures like carbamazepine and  phenobarbital -mexiletine -rifampin -tacrine -thiabendazole -zileuton This list may not describe all possible interactions. Give your health care provider a list of all the medicines, herbs, non-prescription drugs, or dietary supplements you use. Also tell them if you smoke, drink alcohol, or use illegal drugs. Some items may interact with  your medicine. What should I watch for while using this medicine? Your condition will be monitored carefully while you are receiving this medicine. This drug may make you feel generally unwell. This is not uncommon, as chemotherapy can affect healthy cells as well as cancer cells. Report any side effects. Continue your course of treatment even though you feel ill unless your doctor tells you to stop. Call your doctor or health care professional for advice if you get a fever, chills or sore throat, or other symptoms of a cold or flu. Do not treat yourself. This drug decreases your body's ability to fight infections. Try to avoid being around people who are sick. This medicine may increase your risk to bruise or bleed. Call your doctor or health care professional if you notice any unusual bleeding. Be careful brushing and flossing your teeth or using a toothpick because you may get an infection or bleed more easily. If you have any dental work done, tell your dentist you are receiving this medicine. Avoid taking products that contain aspirin, acetaminophen, ibuprofen, naproxen, or ketoprofen unless instructed by your doctor. These medicines may hide a fever. Do not become pregnant while taking this medicine. Women should inform their doctor if they wish to become pregnant or think they might be pregnant. There is a potential for serious side effects to an unborn child. Men should inform their doctors if they wish to father a child. This medicine may lower sperm counts. Talk to your health care professional or pharmacist for more information. Do not breast-feed an infant while taking this medicine. What side effects may I notice from receiving this medicine? Side effects that you should report to your doctor or health care professional as soon as possible: -allergic reactions like skin rash, itching or hives, swelling of the face, lips, or tongue -low blood counts - this medicine may decrease the number of  white blood cells, red blood cells and platelets. You may be at increased risk for infections and bleeding. -signs of infection - fever or chills, cough, sore throat, pain or difficulty passing urine -signs of decreased platelets or bleeding - bruising, pinpoint red spots on the skin, black, tarry stools, blood in the urine -signs of decreased red blood cells - unusually weak or tired, fainting spells, lightheadedness -trouble passing urine or change in the amount of urine Side effects that usually do not require medical attention (report to your doctor or health care professional if they continue or are bothersome): -diarrhea This list may not describe all possible side effects. Call your doctor for medical advice about side effects. You may report side effects to FDA at 1-800-FDA-1088. Where should I keep my medicine? This drug is given in a hospital or clinic and will not be stored at home. NOTE: This sheet is a summary. It may not cover all possible information. If you have questions about this medicine, talk to your doctor, pharmacist, or health care provider.  2012, [employer] Standard. (10/13/2007 3:58:27 PM)

## 2012-07-04 ENCOUNTER — Ambulatory Visit (HOSPITAL_BASED_OUTPATIENT_CLINIC_OR_DEPARTMENT_OTHER): Payer: 59

## 2012-07-04 VITALS — BP 153/80 | HR 58 | Temp 96.8°F | Resp 20

## 2012-07-04 DIAGNOSIS — C9110 Chronic lymphocytic leukemia of B-cell type not having achieved remission: Secondary | ICD-10-CM

## 2012-07-04 DIAGNOSIS — Z5111 Encounter for antineoplastic chemotherapy: Secondary | ICD-10-CM

## 2012-07-04 MED ORDER — DEXAMETHASONE SODIUM PHOSPHATE 10 MG/ML IJ SOLN
10.0000 mg | Freq: Once | INTRAMUSCULAR | Status: AC
Start: 2012-07-04 — End: 2012-07-04
  Administered 2012-07-04: 10 mg via INTRAVENOUS

## 2012-07-04 MED ORDER — SODIUM CHLORIDE 0.9 % IV SOLN
Freq: Once | INTRAVENOUS | Status: AC
  Administered 2012-07-04: 12:00:00 via INTRAVENOUS

## 2012-07-04 MED ORDER — SODIUM CHLORIDE 0.9 % IV SOLN
88.0000 mg/m2 | Freq: Once | INTRAVENOUS | Status: AC
  Administered 2012-07-04: 200 mg via INTRAVENOUS
  Filled 2012-07-04: qty 40

## 2012-07-04 MED ORDER — ONDANSETRON 8 MG/50ML IVPB (CHCC)
8.0000 mg | Freq: Once | INTRAVENOUS | Status: AC
  Administered 2012-07-04: 8 mg via INTRAVENOUS

## 2012-07-04 NOTE — Patient Instructions (Addendum)
Bendamustine Injection What is this medicine? BENDAMUSTINE (BEN da MUS teen) is an anti-cancer drug used to treat a certain type of leukemia. This medicine may be used for other purposes; ask your health care provider or pharmacist if you have questions. What should I tell my health care provider before I take this medicine? They need to know if you have any of these conditions: -kidney disease -liver disease -an unusual or allergic reaction to bendamustine, mannitol, other medicines, foods, dyes, or preservatives -pregnant or trying to get pregnant -breast-feeding How should I use this medicine? This medicine is for infusion into a vein. It is given by a health care professional in a hospital or clinic setting. Talk to your pediatrician regarding the use of this medicine in children. Special care may be needed. Overdosage: If you think you have taken too much of this medicine contact a poison control center or emergency room at once. NOTE: This medicine is only for you. Do not share this medicine with others. What if I miss a dose? It is important not to miss your dose. Call your doctor or health care professional if you are unable to keep an appointment. What may interact with this medicine? Do not take this medicine with any of the following medications: -clozapine This medicine may also interact with the following medications: -atazanavir -cimetidine -ciprofloxacin -enoxacin -fluvoxamine -medicines for seizures like carbamazepine and phenobarbital -mexiletine -rifampin -tacrine -thiabendazole -zileuton This list may not describe all possible interactions. Give your health care provider a list of all the medicines, herbs, non-prescription drugs, or dietary supplements you use. Also tell them if you smoke, drink alcohol, or use illegal drugs. Some items may interact with your medicine. What should I watch for while using this medicine? Your condition will be monitored carefully  while you are receiving this medicine. This drug may make you feel generally unwell. This is not uncommon, as chemotherapy can affect healthy cells as well as cancer cells. Report any side effects. Continue your course of treatment even though you feel ill unless your doctor tells you to stop. Call your doctor or health care professional for advice if you get a fever, chills or sore throat, or other symptoms of a cold or flu. Do not treat yourself. This drug decreases your body's ability to fight infections. Try to avoid being around people who are sick. This medicine may increase your risk to bruise or bleed. Call your doctor or health care professional if you notice any unusual bleeding. Be careful brushing and flossing your teeth or using a toothpick because you may get an infection or bleed more easily. If you have any dental work done, tell your dentist you are receiving this medicine. Avoid taking products that contain aspirin, acetaminophen, ibuprofen, naproxen, or ketoprofen unless instructed by your doctor. These medicines may hide a fever. Do not become pregnant while taking this medicine. Women should inform their doctor if they wish to become pregnant or think they might be pregnant. There is a potential for serious side effects to an unborn child. Men should inform their doctors if they wish to father a child. This medicine may lower sperm counts. Talk to your health care professional or pharmacist for more information. Do not breast-feed an infant while taking this medicine. What side effects may I notice from receiving this medicine? Side effects that you should report to your doctor or health care professional as soon as possible: -allergic reactions like skin rash, itching or hives, swelling of the face,   lips, or tongue -low blood counts - this medicine may decrease the number of white blood cells, red blood cells and platelets. You may be at increased risk for infections and  bleeding. -signs of infection - fever or chills, cough, sore throat, pain or difficulty passing urine -signs of decreased platelets or bleeding - bruising, pinpoint red spots on the skin, black, tarry stools, blood in the urine -signs of decreased red blood cells - unusually weak or tired, fainting spells, lightheadedness -trouble passing urine or change in the amount of urine Side effects that usually do not require medical attention (report to your doctor or health care professional if they continue or are bothersome): -diarrhea This list may not describe all possible side effects. Call your doctor for medical advice about side effects. You may report side effects to FDA at 1-800-FDA-1088. Where should I keep my medicine? This drug is given in a hospital or clinic and will not be stored at home. NOTE: This sheet is a summary. It may not cover all possible information. If you have questions about this medicine, talk to your doctor, pharmacist, or health care provider.  2012, [employer]vier/Gold Standard. (10/13/2007 3:58:27 PM) 

## 2012-07-04 NOTE — Progress Notes (Signed)
CC:   [name], M.D.  DIAGNOSIS:  Stage C chronic lymphocytic leukemia.  CURRENT THERAPY:  Status post recent 3 cycles of Treanda/Rituxan.  INTERIM HISTORY:  Mr. Plitt comes in for his followup.  He is doing well.  He is still working.  He really has not missed much of his workup.  He has had no problems pain wise.  There is no fatigue.  He has had no change in bowel or bladder habits.  He has not noticed any cough or shortness of breath.  There has been no nausea or vomiting.  He has had no rashes.  There has been no leg swelling.  Overall, his performance status is ECOG 0.  PHYSICAL EXAMINATION:  This is a well-developed, well-nourished white gentleman in no obvious distress.  Vital signs:  Show temperature 97.6, pulse 61, respiratory rate 18, blood pressure 123/70.  Weight is 251. Head and neck:  Shows a normocephalic, atraumatic skull.  There are no ocular or oral lesions.  There are no palpable cervical or supraclavicular lymph nodes.  Lungs:  Clear bilaterally.  Cardiac: Regular rate and rhythm with normal S1, S2.  There are no murmurs, rubs or bruits.  Abdomen:  Soft with good bowel sounds.  There is no palpable abdominal mass.  There is no fluid wave.  There is no palpable hepatosplenomegaly.  Axillary:  Exam shows no bilateral axillary adenopathy.  Extremities:  Show no clubbing, cyanosis or edema.  Skin: No rash, ecchymosis or petechia.  Neurologic:  No focal neurological deficits.  LABORATORY STUDIES:  White cell count is 2.9, hemoglobin 15.2, hematocrit 42.3, platelet count is 177.  IMPRESSION:  Mr. Willemsen is a 56 year old gentleman with stage C CLL (chronic lymphocytic leukemia).  We went ahead and started his therapy as his platelet count was decreasing and his white cell count was increasing.  He has done well.  He has responded very nicely.  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  We will plan  for 6 cycles of chemo for his treatment plan.  We will go head with his 4th cycle today.  We will get him back in 1 month for his 5th cycle.   ______________________________ Josph Macho, M.D. PRE/MEDQ  D:  07/03/2012  T:  07/04/2012  Job:  [id number]

## 2012-07-30 ENCOUNTER — Ambulatory Visit (HOSPITAL_BASED_OUTPATIENT_CLINIC_OR_DEPARTMENT_OTHER): Payer: 59 | Admitting: Hematology & Oncology

## 2012-07-30 ENCOUNTER — Ambulatory Visit (HOSPITAL_BASED_OUTPATIENT_CLINIC_OR_DEPARTMENT_OTHER): Payer: 59

## 2012-07-30 ENCOUNTER — Other Ambulatory Visit (HOSPITAL_BASED_OUTPATIENT_CLINIC_OR_DEPARTMENT_OTHER): Payer: 59 | Admitting: Lab

## 2012-07-30 VITALS — BP 135/92 | HR 54 | Temp 97.7°F | Resp 18 | Ht 69.0 in | Wt 253.0 lb

## 2012-07-30 VITALS — BP 129/76 | HR 67 | Temp 97.0°F | Resp 20

## 2012-07-30 DIAGNOSIS — Z5112 Encounter for antineoplastic immunotherapy: Secondary | ICD-10-CM

## 2012-07-30 DIAGNOSIS — C9110 Chronic lymphocytic leukemia of B-cell type not having achieved remission: Secondary | ICD-10-CM

## 2012-07-30 DIAGNOSIS — Z5111 Encounter for antineoplastic chemotherapy: Secondary | ICD-10-CM

## 2012-07-30 LAB — CBC WITH DIFFERENTIAL (CANCER CENTER ONLY)
BASO#: 0.1 10*3/uL (ref 0.0–0.2)
BASO%: 5 % — ABNORMAL HIGH (ref 0.0–2.0)
EOS%: 9.6 % — ABNORMAL HIGH (ref 0.0–7.0)
Eosinophils Absolute: 0.3 10*3/uL (ref 0.0–0.5)
HCT: 41.8 % (ref 38.7–49.9)
HGB: 14.8 g/dL (ref 13.0–17.1)
LYMPH#: 0.4 10*3/uL — ABNORMAL LOW (ref 0.9–3.3)
LYMPH%: 14.2 % (ref 14.0–48.0)
MCH: 32.5 pg (ref 28.0–33.4)
MCHC: 35.4 g/dL (ref 32.0–35.9)
MCV: 92 fL (ref 82–98)
MONO#: 0.7 10*3/uL (ref 0.1–0.9)
MONO%: 27.7 % — ABNORMAL HIGH (ref 0.0–13.0)
NEUT#: 1.1 10*3/uL — ABNORMAL LOW (ref 1.5–6.5)
NEUT%: 43.5 % (ref 40.0–80.0)
Platelets: 169 10*3/uL (ref 145–400)
RBC: 4.55 10*6/uL (ref 4.20–5.70)
RDW: 12.4 % (ref 11.1–15.7)
WBC: 2.6 10*3/uL — ABNORMAL LOW (ref 4.0–10.0)

## 2012-07-30 MED ORDER — ACETAMINOPHEN 325 MG PO TABS
650.0000 mg | ORAL_TABLET | Freq: Once | ORAL | Status: AC
  Administered 2012-07-30: 650 mg via ORAL

## 2012-07-30 MED ORDER — DEXAMETHASONE SODIUM PHOSPHATE 10 MG/ML IJ SOLN
10.0000 mg | Freq: Once | INTRAMUSCULAR | Status: AC
  Administered 2012-07-30: 10 mg via INTRAVENOUS

## 2012-07-30 MED ORDER — ONDANSETRON 8 MG/50ML IVPB (CHCC)
8.0000 mg | Freq: Once | INTRAVENOUS | Status: AC
  Administered 2012-07-30: 8 mg via INTRAVENOUS

## 2012-07-30 MED ORDER — SODIUM CHLORIDE 0.9 % IV SOLN
375.0000 mg/m2 | Freq: Once | INTRAVENOUS | Status: AC
  Administered 2012-07-30: 900 mg via INTRAVENOUS
  Filled 2012-07-30: qty 90

## 2012-07-30 MED ORDER — SODIUM CHLORIDE 0.9 % IV SOLN
88.0000 mg/m2 | Freq: Once | INTRAVENOUS | Status: AC
  Administered 2012-07-30: 200 mg via INTRAVENOUS
  Filled 2012-07-30: qty 40

## 2012-07-30 MED ORDER — SODIUM CHLORIDE 0.9 % IV SOLN
Freq: Once | INTRAVENOUS | Status: AC
  Administered 2012-07-30: 09:00:00 via INTRAVENOUS

## 2012-07-30 MED ORDER — DIPHENHYDRAMINE HCL 25 MG PO CAPS
50.0000 mg | ORAL_CAPSULE | Freq: Once | ORAL | Status: AC
  Administered 2012-07-30: 50 mg via ORAL

## 2012-07-30 NOTE — Progress Notes (Signed)
This office note has been dictated.

## 2012-07-30 NOTE — Patient Instructions (Addendum)
[facility] Discharge Instructions for Patients Receiving Chemotherapy  Today you received the following chemotherapy agents Rituxan and Treanda  Take home medications  To help prevent nausea and vomiting after your treatment, we encourage you to take your nausea medication Compazine 10 mg by mouth every 6 hours as needed for nausea or vomiting If you develop nausea and vomiting that is not controlled by your nausea medication, call the clinic. If it is after clinic hours your family physician or the after hours number for the clinic or go to the Emergency Department.   BELOW ARE SYMPTOMS THAT SHOULD BE REPORTED IMMEDIATELY:  *FEVER GREATER THAN 100.5 F  *CHILLS WITH OR WITHOUT FEVER  NAUSEA AND VOMITING THAT IS NOT CONTROLLED WITH YOUR NAUSEA MEDICATION  *UNUSUAL SHORTNESS OF BREATH  *UNUSUAL BRUISING OR BLEEDING  TENDERNESS IN MOUTH AND THROAT WITH OR WITHOUT PRESENCE OF ULCERS  *URINARY PROBLEMS  *BOWEL PROBLEMS  UNUSUAL RASH Items with * indicate a potential emergency and should be followed up as soon as possible.  One of the nurses will contact you 24 hours after your treatment. Please let the nurse know about any problems that you may have experienced. Feel free to call the clinic you have any questions or concerns. The clinic phone number is [phone]   I have been informed and understand all the instructions given to me. I know to contact the clinic, my physician, or go to the Emergency Department if any problems should occur. I do not have any questions at this time, but understand that I may call the clinic during office hours   should I have any questions or need assistance in obtaining follow up care.    __________________________________________  _____________  __________ Signature of Patient or Authorized Representative            Date                   Time    __________________________________________ Nurse's  Signature       Rituximab injection What is this medicine? RITUXIMAB (ri TUX i mab) is a monoclonal antibody. This medicine changes the way the body's immune system works. It is used commonly to treat non-Hodgkin's lymphoma and other conditions. In cancer cells, this drug targets a specific protein within cancer cells and stops the cancer cells from growing. It is also used to treat rhuematoid arthritis (RA). In RA, this medicine slow the inflammatory process and help reduce joint pain and swelling. This medicine is often used with other cancer or arthritis medications. This medicine may be used for other purposes; ask your health care provider or pharmacist if you have questions. What should I tell my health care provider before I take this medicine? They need to know if you have any of these conditions: -blood disorders -heart disease -history of hepatitis B -infection (especially a virus infection such as chickenpox, cold sores, or herpes) -irregular heartbeat -kidney disease -lung or breathing disease, like asthma -lupus -an unusual or allergic reaction to rituximab, mouse proteins, other medicines, foods, dyes, or preservatives -pregnant or trying to get pregnant -breast-feeding How should I use this medicine? This medicine is for infusion into a vein. It is administered in a hospital or clinic by a specially trained health care professional. A special MedGuide will be given to you by the pharmacist with each prescription and refill. Be sure to read this information carefully each time. Talk to your pediatrician regarding the use of this medicine in   children. This medicine is not approved for use in children. Overdosage: If you think you have taken too much of this medicine contact a poison control center or emergency room at once. NOTE: This medicine is only for you. Do not share this medicine with others. What if I miss a dose? It is important not to miss a dose. Call your  doctor or health care professional if you are unable to keep an appointment. What may interact with this medicine? -cisplatin -medicines for blood pressure -some other medicines for arthritis -vaccines This list may not describe all possible interactions. Give your health care provider a list of all the medicines, herbs, non-prescription drugs, or dietary supplements you use. Also tell them if you smoke, drink alcohol, or use illegal drugs. Some items may interact with your medicine. What should I watch for while using this medicine? Report any side effects that you notice during your treatment right away, such as changes in your breathing, fever, chills, dizziness or lightheadedness. These effects are more common with the first dose. Visit your prescriber or health care professional for checks on your progress. You will need to have regular blood work. Report any other side effects. The side effects of this medicine can continue after you finish your treatment. Continue your course of treatment even though you feel ill unless your doctor tells you to stop. Call your doctor or health care professional for advice if you get a fever, chills or sore throat, or other symptoms of a cold or flu. Do not treat yourself. This drug decreases your body's ability to fight infections. Try to avoid being around people who are sick. This medicine may increase your risk to bruise or bleed. Call your doctor or health care professional if you notice any unusual bleeding. Be careful brushing and flossing your teeth or using a toothpick because you may get an infection or bleed more easily. If you have any dental work done, tell your dentist you are receiving this medicine. Avoid taking products that contain aspirin, acetaminophen, ibuprofen, naproxen, or ketoprofen unless instructed by your doctor. These medicines may hide a fever. Do not become pregnant while taking this medicine. Women should inform their doctor if  they wish to become pregnant or think they might be pregnant. There is a potential for serious side effects to an unborn child. Talk to your health care professional or pharmacist for more information. Do not breast-feed an infant while taking this medicine. What side effects may I notice from receiving this medicine? Side effects that you should report to your doctor or health care professional as soon as possible: -allergic reactions like skin rash, itching or hives, swelling of the face, lips, or tongue -low blood counts - this medicine may decrease the number of white blood cells, red blood cells and platelets. You may be at increased risk for infections and bleeding. -signs of infection - fever or chills, cough, sore throat, pain or difficulty passing urine -signs of decreased platelets or bleeding - bruising, pinpoint red spots on the skin, black, tarry stools, blood in the urine -signs of decreased red blood cells - unusually weak or tired, fainting spells, lightheadedness -breathing problems -confused, not responsive -chest pain -fast, irregular heartbeat -feeling faint or lightheaded, falls -mouth sores -redness, blistering, peeling or loosening of the skin, including inside the mouth -stomach pain -swelling of the ankles, feet, or hands -trouble passing urine or change in the amount of urine Side effects that usually do not require medical attention (  report to your doctor or other health care professional if they continue or are bothersome): -anxiety -headache -loss of appetite -muscle aches -nausea -night sweats This list may not describe all possible side effects. Call your doctor for medical advice about side effects. You may report side effects to FDA at 1-800-FDA-1088. Where should I keep my medicine? This drug is given in a hospital or clinic and will not be stored at home.   Bendamustine Injection What is this medicine? BENDAMUSTINE (BEN da MUS teen) is an anti-cancer  drug used to treat a certain type of leukemia. This medicine may be used for other purposes; ask your health care provider or pharmacist if you have questions. What should I tell my health care provider before I take this medicine? They need to know if you have any of these conditions: -kidney disease -liver disease -an unusual or allergic reaction to bendamustine, mannitol, other medicines, foods, dyes, or preservatives -pregnant or trying to get pregnant -breast-feeding How should I use this medicine? This medicine is for infusion into a vein. It is given by a health care professional in a hospital or clinic setting. Talk to your pediatrician regarding the use of this medicine in children. Special care may be needed. Overdosage: If you think you have taken too much of this medicine contact a poison control center or emergency room at once. NOTE: This medicine is only for you. Do not share this medicine with others. What if I miss a dose? It is important not to miss your dose. Call your doctor or health care professional if you are unable to keep an appointment. What may interact with this medicine? Do not take this medicine with any of the following medications: -clozapine This medicine may also interact with the following medications: -atazanavir -cimetidine -ciprofloxacin -enoxacin -fluvoxamine -medicines for seizures like carbamazepine and phenobarbital -mexiletine -rifampin -tacrine -thiabendazole -zileuton This list may not describe all possible interactions. Give your health care provider a list of all the medicines, herbs, non-prescription drugs, or dietary supplements you use. Also tell them if you smoke, drink alcohol, or use illegal drugs. Some items may interact with your medicine. What should I watch for while using this medicine? Your condition will be monitored carefully while you are receiving this medicine. This drug may make you feel generally unwell. This is not  uncommon, as chemotherapy can affect healthy cells as well as cancer cells. Report any side effects. Continue your course of treatment even though you feel ill unless your doctor tells you to stop. Call your doctor or health care professional for advice if you get a fever, chills or sore throat, or other symptoms of a cold or flu. Do not treat yourself. This drug decreases your body's ability to fight infections. Try to avoid being around people who are sick. This medicine may increase your risk to bruise or bleed. Call your doctor or health care professional if you notice any unusual bleeding. Be careful brushing and flossing your teeth or using a toothpick because you may get an infection or bleed more easily. If you have any dental work done, tell your dentist you are receiving this medicine. Avoid taking products that contain aspirin, acetaminophen, ibuprofen, naproxen, or ketoprofen unless instructed by your doctor. These medicines may hide a fever. Do not become pregnant while taking this medicine. Women should inform their doctor if they wish to become pregnant or think they might be pregnant. There is a potential for serious side effects to an unborn child.   Men should inform their doctors if they wish to father a child. This medicine may lower sperm counts. Talk to your health care professional or pharmacist for more information. Do not breast-feed an infant while taking this medicine. What side effects may I notice from receiving this medicine? Side effects that you should report to your doctor or health care professional as soon as possible: -allergic reactions like skin rash, itching or hives, swelling of the face, lips, or tongue -low blood counts - this medicine may decrease the number of white blood cells, red blood cells and platelets. You may be at increased risk for infections and bleeding. -signs of infection - fever or chills, cough, sore throat, pain or difficulty passing urine -signs  of decreased platelets or bleeding - bruising, pinpoint red spots on the skin, black, tarry stools, blood in the urine -signs of decreased red blood cells - unusually weak or tired, fainting spells, lightheadedness -trouble passing urine or change in the amount of urine Side effects that usually do not require medical attention (report to your doctor or health care professional if they continue or are bothersome): -diarrhea This list may not describe all possible side effects. Call your doctor for medical advice about side effects. You may report side effects to FDA at 1-800-FDA-1088. Where should I keep my medicine? This drug is given in a hospital or clinic and will not be stored at home. NOTE: This sheet is a summary. It may not cover all possible information. If you have questions about this medicine, talk to your doctor, pharmacist, or health care provider.  2012, [employer]. (10/13/2007 3:58:27 PM) NOTE: This sheet is a summary. It may not cover all possible information. If you have questions about this medicine, talk to your doctor, pharmacist, or health care provider.  2013, [employer] Standard. (03/08/2008 2:04:59 PM)  

## 2012-07-31 ENCOUNTER — Ambulatory Visit (HOSPITAL_BASED_OUTPATIENT_CLINIC_OR_DEPARTMENT_OTHER): Payer: 59

## 2012-07-31 ENCOUNTER — Ambulatory Visit: Payer: 59

## 2012-07-31 DIAGNOSIS — C9110 Chronic lymphocytic leukemia of B-cell type not having achieved remission: Secondary | ICD-10-CM

## 2012-07-31 DIAGNOSIS — Z5111 Encounter for antineoplastic chemotherapy: Secondary | ICD-10-CM

## 2012-07-31 MED ORDER — ONDANSETRON 8 MG/50ML IVPB (CHCC)
8.0000 mg | Freq: Once | INTRAVENOUS | Status: AC
  Administered 2012-07-31: 8 mg via INTRAVENOUS

## 2012-07-31 MED ORDER — DEXAMETHASONE SODIUM PHOSPHATE 10 MG/ML IJ SOLN
10.0000 mg | Freq: Once | INTRAMUSCULAR | Status: AC
  Administered 2012-07-31: 10 mg via INTRAVENOUS

## 2012-07-31 MED ORDER — SODIUM CHLORIDE 0.9 % IV SOLN
Freq: Once | INTRAVENOUS | Status: AC
  Administered 2012-07-31: 10:00:00 via INTRAVENOUS

## 2012-07-31 MED ORDER — SODIUM CHLORIDE 0.9 % IV SOLN
88.0000 mg/m2 | Freq: Once | INTRAVENOUS | Status: AC
  Administered 2012-07-31: 200 mg via INTRAVENOUS
  Filled 2012-07-31: qty 40

## 2012-07-31 NOTE — Patient Instructions (Addendum)
Bendamustine Injection What is this medicine? BENDAMUSTINE (BEN da MUS teen) is an anti-cancer drug used to treat a certain type of leukemia. This medicine may be used for other purposes; ask your health care provider or pharmacist if you have questions. What should I tell my health care provider before I take this medicine? They need to know if you have any of these conditions: -kidney disease -liver disease -an unusual or allergic reaction to bendamustine, mannitol, other medicines, foods, dyes, or preservatives -pregnant or trying to get pregnant -breast-feeding How should I use this medicine? This medicine is for infusion into a vein. It is given by a health care professional in a hospital or clinic setting. Talk to your pediatrician regarding the use of this medicine in children. Special care may be needed. Overdosage: If you think you have taken too much of this medicine contact a poison control center or emergency room at once. NOTE: This medicine is only for you. Do not share this medicine with others. What if I miss a dose? It is important not to miss your dose. Call your doctor or health care professional if you are unable to keep an appointment. What may interact with this medicine? Do not take this medicine with any of the following medications: -clozapine This medicine may also interact with the following medications: -atazanavir -cimetidine -ciprofloxacin -enoxacin -fluvoxamine -medicines for seizures like carbamazepine and phenobarbital -mexiletine -rifampin -tacrine -thiabendazole -zileuton This list may not describe all possible interactions. Give your health care provider a list of all the medicines, herbs, non-prescription drugs, or dietary supplements you use. Also tell them if you smoke, drink alcohol, or use illegal drugs. Some items may interact with your medicine. What should I watch for while using this medicine? Your condition will be monitored carefully  while you are receiving this medicine. This drug may make you feel generally unwell. This is not uncommon, as chemotherapy can affect healthy cells as well as cancer cells. Report any side effects. Continue your course of treatment even though you feel ill unless your doctor tells you to stop. Call your doctor or health care professional for advice if you get a fever, chills or sore throat, or other symptoms of a cold or flu. Do not treat yourself. This drug decreases your body's ability to fight infections. Try to avoid being around people who are sick. This medicine may increase your risk to bruise or bleed. Call your doctor or health care professional if you notice any unusual bleeding. Be careful brushing and flossing your teeth or using a toothpick because you may get an infection or bleed more easily. If you have any dental work done, tell your dentist you are receiving this medicine. Avoid taking products that contain aspirin, acetaminophen, ibuprofen, naproxen, or ketoprofen unless instructed by your doctor. These medicines may hide a fever. Do not become pregnant while taking this medicine. Women should inform their doctor if they wish to become pregnant or think they might be pregnant. There is a potential for serious side effects to an unborn child. Men should inform their doctors if they wish to father a child. This medicine may lower sperm counts. Talk to your health care professional or pharmacist for more information. Do not breast-feed an infant while taking this medicine. What side effects may I notice from receiving this medicine? Side effects that you should report to your doctor or health care professional as soon as possible: -allergic reactions like skin rash, itching or hives, swelling of the face,   lips, or tongue -low blood counts - this medicine may decrease the number of white blood cells, red blood cells and platelets. You may be at increased risk for infections and  bleeding. -signs of infection - fever or chills, cough, sore throat, pain or difficulty passing urine -signs of decreased platelets or bleeding - bruising, pinpoint red spots on the skin, black, tarry stools, blood in the urine -signs of decreased red blood cells - unusually weak or tired, fainting spells, lightheadedness -trouble passing urine or change in the amount of urine Side effects that usually do not require medical attention (report to your doctor or health care professional if they continue or are bothersome): -diarrhea This list may not describe all possible side effects. Call your doctor for medical advice about side effects. You may report side effects to FDA at 1-800-FDA-1088. Where should I keep my medicine? This drug is given in a hospital or clinic and will not be stored at home. NOTE: This sheet is a summary. It may not cover all possible information. If you have questions about this medicine, talk to your doctor, pharmacist, or health care provider.  2012, [employer]vier/Gold Standard. (10/13/2007 3:58:27 PM) 

## 2012-07-31 NOTE — Progress Notes (Signed)
CC:   [name], M.D.  DIAGNOSIS:  Chronic lymphocytic leukemia, stage C.  CURRENT THERAPY:  Status post 4 cycles of Treanda/Rituxan.  INTERIM HISTORY:  Charles [name] comes in for his followup.  He is still doing quite well.  He has tolerated treatment incredibly well.  He has really had no complications from therapy so far.  He has had no nausea or vomiting.  He has had no fatigue or weakness. 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  He has had no palpable lymph glands.  He has had no rashes.  He has had no fever, sweats, or chills.  There has been no bleeding.  There has been no diarrhea.  PHYSICAL EXAMINATION:  GENERAL:  This is a well-developed, well- nourished white gentleman in no obvious distress.  Vital Signs: Temperature 97.7, pulse 54, respiratory rate 16, blood pressure 135/92. Weight is 257.  Head and Neck:  Normocephalic, atraumatic skull.  There are no ocular or oral lesions.  There are no palpable cervical or supraclavicular lymph nodes.  Lungs:  Clear bilaterally.  Cardiac: Regular rate and rhythm with a normal S1 and S2.  There are no murmurs, rubs, or bruits.  Abdomen:  Soft with good bowel sounds.  There is no palpable abdominal mass.  There is no palpable hepatosplenomegaly. Back:  No tenderness over the spine, ribs, or hips.  Extremities:  No clubbing, cyanosis or edema.  LABORATORY DATA:  White cell count 2.6, hemoglobin 14.8, hematocrit 41.8, platelet count 169.  IMPRESSION:  Charles [name] is a 57 year old gentleman with stage chronic lymphocytic leukemia.  He has responded well to treatment.  We will go ahead and plan for his 5th cycle of treatment today.  We will then plan to get him back in 4 weeks for his 6th, should be, final cycle of treatment.    ______________________________ Josph Macho, M.D. PRE/MEDQ  D:  07/30/2012  T:  07/31/2012  Job:  [id number]

## 2012-08-01 LAB — COMPREHENSIVE METABOLIC PANEL WITH GFR
ALT: 18 U/L (ref 0–53)
AST: 22 U/L (ref 0–37)
Albumin: 4.1 g/dL (ref 3.5–5.2)
Alkaline Phosphatase: 66 U/L (ref 39–117)
BUN: 9 mg/dL (ref 6–23)
CO2: 25 meq/L (ref 19–32)
Calcium: 8.9 mg/dL (ref 8.4–10.5)
Chloride: 103 meq/L (ref 96–112)
Creatinine, Ser: 0.9 mg/dL (ref 0.50–1.35)
Glucose, Bld: 91 mg/dL (ref 70–99)
Potassium: 3.9 meq/L (ref 3.5–5.3)
Sodium: 135 meq/L (ref 135–145)
Total Bilirubin: 0.5 mg/dL (ref 0.3–1.2)
Total Protein: 6 g/dL (ref 6.0–8.3)

## 2012-08-01 LAB — PROTEIN ELECTROPHORESIS, SERUM, WITH REFLEX
Albumin ELP: 64.5 % (ref 55.8–66.1)
Alpha-1-Globulin: 4.3 % (ref 2.9–4.9)
Alpha-2-Globulin: 11.2 % (ref 7.1–11.8)
Beta 2: 3.9 % (ref 3.2–6.5)
Beta Globulin: 6.5 % (ref 4.7–7.2)
Gamma Globulin: 9.6 % — ABNORMAL LOW (ref 11.1–18.8)
Total Protein, Serum Electrophoresis: 6 g/dL (ref 6.0–8.3)

## 2012-08-06 ENCOUNTER — Ambulatory Visit: Payer: 59

## 2012-08-06 ENCOUNTER — Ambulatory Visit (INDEPENDENT_AMBULATORY_CARE_PROVIDER_SITE_OTHER): Payer: 59 | Admitting: Family Medicine

## 2012-08-06 VITALS — BP 150/72 | HR 83 | Temp 98.5°F | Resp 18 | Ht 68.5 in | Wt 248.6 lb

## 2012-08-06 DIAGNOSIS — R05 Cough: Secondary | ICD-10-CM

## 2012-08-06 DIAGNOSIS — R509 Fever, unspecified: Secondary | ICD-10-CM

## 2012-08-06 DIAGNOSIS — R059 Cough, unspecified: Secondary | ICD-10-CM

## 2012-08-06 DIAGNOSIS — E7800 Pure hypercholesterolemia, unspecified: Secondary | ICD-10-CM

## 2012-08-06 LAB — POCT CBC
Granulocyte percent: 64.9 %G (ref 37–80)
HCT, POC: 48.7 % (ref 43.5–53.7)
Hemoglobin: 15.9 g/dL (ref 14.1–18.1)
Lymph, poc: 0.6 (ref 0.6–3.4)
MCH, POC: 32.1 pg — AB (ref 27–31.2)
MCHC: 32.6 g/dL (ref 31.8–35.4)
MCV: 98.3 fL — AB (ref 80–97)
MID (cbc): 1.2 — AB (ref 0–0.9)
MPV: 10 fL (ref 0–99.8)
POC Granulocyte: 3.3 (ref 2–6.9)
POC LYMPH PERCENT: 11.8 %L (ref 10–50)
POC MID %: 23.3 %M — AB (ref 0–12)
Platelet Count, POC: 185 10*3/uL (ref 142–424)
RBC: 4.95 M/uL (ref 4.69–6.13)
RDW, POC: 13.6 %
WBC: 5.1 10*3/uL (ref 4.6–10.2)

## 2012-08-06 LAB — POCT INFLUENZA A/B
Influenza A, POC: NEGATIVE
Influenza B, POC: NEGATIVE

## 2012-08-06 MED ORDER — CEFDINIR 300 MG PO CAPS: 300.0000 mg | ORAL_CAPSULE | Freq: Two times a day (BID) | ORAL | Status: DC

## 2012-08-06 MED ORDER — ROSUVASTATIN CALCIUM 10 MG PO TABS: 10.0000 mg | ORAL_TABLET | Freq: Every morning | ORAL | Status: DC

## 2012-08-06 NOTE — Progress Notes (Signed)
[facility] [address], [place] [address] [phone]  Date:  08/06/2012   Name:  Charles [name]   DOB:  Jul 28, 1955   MRN:  [id number]  PCP:  Herb Grays, MD    Chief Complaint: Cough, Nasal Congestion, Sore Throat and Headache   History of Present Illness:  Charles [name] is a 57 y.o. very pleasant male patient who presents with the following:  Here today with illness.  He also has a current history of CLL.  He sees Dr. Twanna Hy and is being treated with chemotherapy- Treanda/ Rituxan.     Over the week-end he noted a runny nose.  Then yesterday he noted that his "diaphragm hurt if I went to blow my nose, laugh or cough."  Last night he noted chills and felt hot, although he did not have a fever.   He still has a runny nose, and has a "raspy" cough.  He also has a HA.   He did have a ST yesterday but none today. No abdominal pain, nausea, vomiting or diarrhea.    He has had 5 sessions of chemo which usually just causes nausea.  He last had chemo on 07/31/12- last Thursday.    Besides leukemia he has been healthy.  No history of CAD.  He does not feel that he is really having CP- just pain when he coughs or otherwise forcefully expels air.   Patient Active Problem List  Diagnosis  . CLL (chronic lymphocytic leukemia)    Past Medical History  Diagnosis Date  . CLL (chronic lymphoblastic leukemia) 08/03/2011  . Shingles   . CLL (chronic lymphocytic leukemia) 03/18/2012  . Ulcer     Past Surgical History  Procedure Date  . Knee surgery   . Shoulder surgery   . Hernia repair   . Wrist surgery     History  Substance Use Topics  . Smoking status: Former Games developer  . Smokeless tobacco: Not on file     Comment: quit smoking 15 years ago  . Alcohol Use: Yes    No family history on file.  No Known Allergies  Medication list has been reviewed and updated.  Current Outpatient Prescriptions on File Prior to Visit  Medication Sig Dispense Refill    . Ascorbic Acid (VITAMIN C) 1000 MG tablet Take 500 mg by mouth daily.       Marland Kitchen aspirin 81 MG tablet Take 81 mg by mouth daily.      . cetirizine (ZYRTEC) 10 MG tablet Take 10 mg by mouth daily.      . Cholecalciferol (VITAMIN D3) 2000 UNITS TABS Take 2,000 Units by mouth.      . esomeprazole (NEXIUM) 40 MG capsule Take 40 mg by mouth daily before breakfast.      . famciclovir (FAMVIR) 500 MG tablet Take 500 mg by mouth every morning.      . promethazine (PHENERGAN) 25 MG tablet Take 12.5 mg by mouth as needed.      . rosuvastatin (CRESTOR) 10 MG tablet Take 10 mg by mouth every morning.         Review of Systems:  As per HPI- otherwise negative.   Physical Examination: Filed Vitals:   08/06/12 1003  BP: 150/72  Pulse: 83  Temp: 98.5 F (36.9 C)  Resp: 18   Filed Vitals:   08/06/12 1003  Height: 5' 8.5" (1.74 m)  Weight: 248 lb 9.6 oz (112.764 kg)   Body mass index  is 37.25 kg/(m^2). Ideal Body Weight: Weight in (lb) to have BMI = 25: 166.5   GEN: WDWN, NAD, Non-toxic, A & O x 3, overweight HEENT: Atraumatic, Normocephalic. Neck supple. No masses, No LAD. Bilateral TM wnl, oropharynx normal.  PEERL,EOMI.   Ears and Nose: No external deformity. CV: RRR, No M/G/R. No JVD. No thrill. No extra heart sounds. PULM: CTA B, no wheezes, crackles, rhonchi. No retractions. No resp. distress. No accessory muscle use. ABD: S, ND, +BS. No rebound. No HSM. He is slightly tender in his lateral abdominal muscles bilaterally he recognizes this as where he has pain when he coughs  EXTR: No c/c/e NEURO Normal gait.  PSYCH: Normally interactive. Conversant. Not depressed or anxious appearing.  Calm demeanor.   [facility] reading (PRIMARY) by  Dr. Patsy Lager. CXR:  No acute disease  CHEST - 2 VIEW  Comparison: 03/19/2012  Findings: Small hiatal hernia. The heart and mediastinum and hila are otherwise unremarkable. The lungs are clear.  IMPRESSION: No active disease of the chest.  EKG: NSR,  no ST elevation or depression.    Results for orders placed in visit on 08/06/12  POCT CBC      Component Value Range   WBC 5.1  4.6 - 10.2 K/uL   Lymph, poc 0.6  0.6 - 3.4   POC LYMPH PERCENT 11.8  10 - 50 %L   MID (cbc) 1.2 (*) 0 - 0.9   POC MID % 23.3 (*) 0 - 12 %M   POC Granulocyte 3.3  2 - 6.9   Granulocyte percent 64.9  37 - 80 %G   RBC 4.95  4.69 - 6.13 M/uL   Hemoglobin 15.9  14.1 - 18.1 g/dL   HCT, POC 16.1  09.6 - 53.7 %   MCV 98.3 (*) 80 - 97 fL   MCH, POC 32.1 (*) 27 - 31.2 pg   MCHC 32.6  31.8 - 35.4 g/dL   RDW, POC 04.5     Platelet Count, POC 185  142 - 424 K/uL   MPV 10.0  0 - 99.8 fL  POCT INFLUENZA A/B      Component Value Range   Influenza A, POC Negative     Influenza B, POC Negative      Assessment and Plan: 1. Fever  POCT CBC, POCT Influenza A/B, EKG 12-Lead, cefdinir (OMNICEF) 300 MG capsule  2. Cough  POCT Influenza A/B, DG Chest 2 View, EKG 12-Lead, cefdinir (OMNICEF) 300 MG capsule  3. High cholesterol  rosuvastatin (CRESTOR) 10 MG tablet   Called and discussed with Dr. Drue Dun.  He agreed with covering with abx in this case.   He was already aware of his hiatal hernia.  Will treat with omnicef- he is aware of need to closely monitor his symptoms and seek care if he is getting worse or has a fever.    Abbe Amsterdam, MD

## 2012-08-06 NOTE — Patient Instructions (Addendum)
We are going to treat you with omnicef for any bacterial sinus or lung infection. If you are not feeling better in the next couple of days please let us or Dr. Myna Hidalgo know- Sooner if worse.

## 2012-08-28 ENCOUNTER — Ambulatory Visit (HOSPITAL_BASED_OUTPATIENT_CLINIC_OR_DEPARTMENT_OTHER): Payer: 59

## 2012-08-28 ENCOUNTER — Ambulatory Visit (HOSPITAL_BASED_OUTPATIENT_CLINIC_OR_DEPARTMENT_OTHER): Payer: 59 | Admitting: Hematology & Oncology

## 2012-08-28 ENCOUNTER — Other Ambulatory Visit (HOSPITAL_BASED_OUTPATIENT_CLINIC_OR_DEPARTMENT_OTHER): Payer: 59 | Admitting: Lab

## 2012-08-28 VITALS — BP 132/78 | HR 61 | Temp 97.5°F | Resp 18 | Ht 68.0 in | Wt 251.0 lb

## 2012-08-28 VITALS — BP 137/84 | HR 84 | Temp 98.0°F | Resp 20

## 2012-08-28 DIAGNOSIS — C9110 Chronic lymphocytic leukemia of B-cell type not having achieved remission: Secondary | ICD-10-CM

## 2012-08-28 DIAGNOSIS — Z5111 Encounter for antineoplastic chemotherapy: Secondary | ICD-10-CM

## 2012-08-28 DIAGNOSIS — Z5112 Encounter for antineoplastic immunotherapy: Secondary | ICD-10-CM

## 2012-08-28 LAB — CBC WITH DIFFERENTIAL (CANCER CENTER ONLY)
HCT: 43.3 % (ref 38.7–49.9)
HGB: 15.5 g/dL (ref 13.0–17.1)
MCH: 32.6 pg (ref 28.0–33.4)
MCHC: 35.8 g/dL (ref 32.0–35.9)
MCV: 91 fL (ref 82–98)
Platelets: 163 10*3/uL (ref 145–400)
RBC: 4.76 10*6/uL (ref 4.20–5.70)
RDW: 12.7 % (ref 11.1–15.7)
WBC: 3.7 10*3/uL — ABNORMAL LOW (ref 4.0–10.0)

## 2012-08-28 LAB — MANUAL DIFFERENTIAL (CHCC SATELLITE)
ALC: 0.4 10*3/uL — ABNORMAL LOW (ref 0.9–3.3)
ANC (CHCC HP manual diff): 2.9 10*3/uL (ref 1.5–6.5)
BASO: 2 % (ref 0–2)
Band Neutrophils: 2 % (ref 0–10)
Eos: 4 % (ref 0–7)
LYMPH: 11 % — ABNORMAL LOW (ref 14–48)
MONO: 5 % (ref 0–13)
PLT EST ~~LOC~~: ADEQUATE
RBC Comments: NORMAL
SEG: 76 % — ABNORMAL HIGH (ref 40–75)

## 2012-08-28 LAB — CHCC SATELLITE - SMEAR

## 2012-08-28 MED ORDER — DEXAMETHASONE SODIUM PHOSPHATE 10 MG/ML IJ SOLN
10.0000 mg | Freq: Once | INTRAMUSCULAR | Status: AC
  Administered 2012-08-28: 10 mg via INTRAVENOUS

## 2012-08-28 MED ORDER — ONDANSETRON 8 MG/50ML IVPB (CHCC)
8.0000 mg | Freq: Once | INTRAVENOUS | Status: AC
  Administered 2012-08-28: 8 mg via INTRAVENOUS

## 2012-08-28 MED ORDER — SODIUM CHLORIDE 0.9 % IV SOLN
Freq: Once | INTRAVENOUS | Status: AC
  Administered 2012-08-28: 09:00:00 via INTRAVENOUS

## 2012-08-28 MED ORDER — BENDAMUSTINE HCL (LYOPHILIZED PWD) CHEMO INJECTION 100MG
88.0000 mg/m2 | Freq: Once | INTRAVENOUS | Status: AC
  Administered 2012-08-28: 200 mg via INTRAVENOUS
  Filled 2012-08-28: qty 40

## 2012-08-28 MED ORDER — DIPHENHYDRAMINE HCL 25 MG PO CAPS
50.0000 mg | ORAL_CAPSULE | Freq: Once | ORAL | Status: AC
  Administered 2012-08-28: 50 mg via ORAL

## 2012-08-28 MED ORDER — ACETAMINOPHEN 325 MG PO TABS
650.0000 mg | ORAL_TABLET | Freq: Once | ORAL | Status: AC
  Administered 2012-08-28: 650 mg via ORAL

## 2012-08-28 MED ORDER — SODIUM CHLORIDE 0.9 % IV SOLN
375.0000 mg/m2 | Freq: Once | INTRAVENOUS | Status: AC
  Administered 2012-08-28: 900 mg via INTRAVENOUS
  Filled 2012-08-28: qty 90

## 2012-08-28 NOTE — Patient Instructions (Addendum)
[facility] Discharge Instructions for Patients Receiving Chemotherapy  Today you received chemotherapy agents:  Rituxan and Treanda  To help prevent nausea and vomiting after your treatment, we encourage you to take your nausea medication as prescribed. If you develop nausea and vomiting that is not controlled by your nausea medication, call the clinic. If it is after clinic hours your family physician or the after hours number for the clinic or go to the Emergency Department.   BELOW ARE SYMPTOMS THAT SHOULD BE REPORTED IMMEDIATELY:  *FEVER GREATER THAN 100.5 F  *CHILLS WITH OR WITHOUT FEVER  NAUSEA AND VOMITING THAT IS NOT CONTROLLED WITH YOUR NAUSEA MEDICATION  *UNUSUAL SHORTNESS OF BREATH  *UNUSUAL BRUISING OR BLEEDING  TENDERNESS IN MOUTH AND THROAT WITH OR WITHOUT PRESENCE OF ULCERS  *URINARY PROBLEMS  *BOWEL PROBLEMS  UNUSUAL RASH Items with * indicate a potential emergency and should be followed up as soon as possible.   Please let the nurse know about any problems that you may have experienced. Feel free to call the clinic you have any questions or concerns. The clinic phone number is [phone].   I have been informed and understand all the instructions given to me. I know to contact the clinic, my physician, or go to the Emergency Department if any problems should occur. I do not have any questions at this time, but understand that I may call the clinic during office hours   should I have any questions or need assistance in obtaining follow up care.    __________________________________________  _____________  __________ Signature of Patient or Authorized Representative            Date                   Time    __________________________________________ Nurse's Signature

## 2012-08-28 NOTE — Progress Notes (Signed)
This office note has been dictated.

## 2012-08-29 ENCOUNTER — Ambulatory Visit (HOSPITAL_BASED_OUTPATIENT_CLINIC_OR_DEPARTMENT_OTHER): Payer: 59

## 2012-08-29 VITALS — BP 155/84 | HR 69 | Temp 96.8°F | Resp 18

## 2012-08-29 DIAGNOSIS — C9110 Chronic lymphocytic leukemia of B-cell type not having achieved remission: Secondary | ICD-10-CM

## 2012-08-29 DIAGNOSIS — Z5111 Encounter for antineoplastic chemotherapy: Secondary | ICD-10-CM

## 2012-08-29 MED ORDER — SODIUM CHLORIDE 0.9 % IV SOLN
Freq: Once | INTRAVENOUS | Status: AC
  Administered 2012-08-29: 11:00:00 via INTRAVENOUS

## 2012-08-29 MED ORDER — SODIUM CHLORIDE 0.9 % IV SOLN
88.0000 mg/m2 | Freq: Once | INTRAVENOUS | Status: AC
  Administered 2012-08-29: 200 mg via INTRAVENOUS
  Filled 2012-08-29: qty 40

## 2012-08-29 MED ORDER — ONDANSETRON 8 MG/50ML IVPB (CHCC)
8.0000 mg | Freq: Once | INTRAVENOUS | Status: AC
  Administered 2012-08-29: 8 mg via INTRAVENOUS

## 2012-08-29 MED ORDER — DEXAMETHASONE SODIUM PHOSPHATE 10 MG/ML IJ SOLN
10.0000 mg | Freq: Once | INTRAMUSCULAR | Status: AC
  Administered 2012-08-29: 10 mg via INTRAVENOUS

## 2012-08-29 NOTE — Progress Notes (Signed)
CC:   [name], M.D.  DIAGNOSIS:  Stage C chronic lymphocytic leukemia.  CURRENT THERAPY:  The patient is status post 5 cycles of Treanda/Rituxan.  INTERIM HISTORY:  Mr. Kunz comes in for his followup.  Again, he has done incredibly well.  He has really had very little in the way of toxicity from the chemotherapy.  He is still working pretty much full- time.  He has had no fevers, sweats or chills.  He does have a little bit of an upper respiratory tract infection right now.  He had a chest x- ray done.  This was negative.  He has had no bleeding.  He has had no nausea or vomiting.  He has had no rashes.  There has been no leg swelling.  He has had no headache.  PHYSICAL EXAMINATION:  General:  This is a well-developed, well- nourished white gentleman in no obvious distress.  Vital signs:  Show a temperature of 97.5, pulse 61, respiratory rate 18, blood pressure 132/78.  Weight is 251.  Head and neck:  Shows a normocephalic, atraumatic skull.  There are no ocular or oral lesions.  There are no palpable cervical or supraclavicular lymph nodes.  Lungs:  Clear bilaterally.  Cardiac:  Regular rate and rhythm with a normal S1, S2. There are no murmurs, rubs or bruits.  Abdomen:  Soft with good bowel sounds.  There is no palpable abdominal mass.  There is no fluid wave. There is no palpable hepatosplenomegaly.  Skin:  No rash, ecchymosis or petechia.  Axillary:  Shows no bilateral axillary adenopathy.  LABORATORY STUDIES:  White cell count is 3.7, hemoglobin 15, hematocrit 45, platelet count 167.  IMPRESSION:  Mr. Hun is a 57 year old gentleman with stage C chronic lymphocytic leukemia.  Again, he is doing incredibly well.  We will go ahead with our last cycle of chemotherapy now.  I think 6 cycles will be appropriate for him.  He has a 13Q minus genetic abnormality which is typically a good prognosis.  We will go ahead and plan for a followup CT scan.  This will be  to get [place] a "baseline" at the end of therapy.  We probably will do this in about 6 weeks.  He will need another bone marrow test.  We will probably do this sometime in April.  I just feel grateful that Mr. Amsden has done so well.    ______________________________ Josph Macho, M.D. PRE/MEDQ  D:  08/28/2012  T:  08/29/2012  Job:  [id number]

## 2012-08-29 NOTE — Patient Instructions (Addendum)
[facility] Discharge Instructions for Patients Receiving Chemotherapy  Today you received chemotherapy agents: Treanda  To help prevent nausea and vomiting after your treatment, we encourage you to take your nausea medication as prescribed. If you develop nausea and vomiting that is not controlled by your nausea medication, call the clinic. If it is after clinic hours your family physician or the after hours number for the clinic or go to the Emergency Department.   BELOW ARE SYMPTOMS THAT SHOULD BE REPORTED IMMEDIATELY:  *FEVER GREATER THAN 100.5 F  *CHILLS WITH OR WITHOUT FEVER  NAUSEA AND VOMITING THAT IS NOT CONTROLLED WITH YOUR NAUSEA MEDICATION  *UNUSUAL SHORTNESS OF BREATH  *UNUSUAL BRUISING OR BLEEDING  TENDERNESS IN MOUTH AND THROAT WITH OR WITHOUT PRESENCE OF ULCERS  *URINARY PROBLEMS  *BOWEL PROBLEMS  UNUSUAL RASH Items with * indicate a potential emergency and should be followed up as soon as possible.   Please let the nurse know about any problems that you may have experienced. Feel free to call the clinic you have any questions or concerns. The clinic phone number is [phone].   I have been informed and understand all the instructions given to me. I know to contact the clinic, my physician, or go to the Emergency Department if any problems should occur. I do not have any questions at this time, but understand that I may call the clinic during office hours   should I have any questions or need assistance in obtaining follow up care.    __________________________________________  _____________  __________ Signature of Patient or Authorized Representative            Date                   Time    __________________________________________ Nurse's Signature

## 2012-09-17 ENCOUNTER — Other Ambulatory Visit: Payer: 59 | Admitting: Lab

## 2012-09-17 ENCOUNTER — Ambulatory Visit: Payer: 59 | Admitting: Hematology & Oncology

## 2012-10-08 ENCOUNTER — Ambulatory Visit (HOSPITAL_COMMUNITY)
Admission: RE | Admit: 2012-10-08 | Discharge: 2012-10-08 | Disposition: A | Discharge status: Home / Self Care | Payer: 59 | Source: Ambulatory Visit | Attending: Hematology & Oncology | Admitting: Hematology & Oncology

## 2012-10-08 DIAGNOSIS — K449 Diaphragmatic hernia without obstruction or gangrene: Secondary | ICD-10-CM | POA: Insufficient documentation

## 2012-10-08 DIAGNOSIS — K047 Periapical abscess without sinus: Secondary | ICD-10-CM | POA: Insufficient documentation

## 2012-10-08 DIAGNOSIS — C9110 Chronic lymphocytic leukemia of B-cell type not having achieved remission: Secondary | ICD-10-CM | POA: Insufficient documentation

## 2012-10-08 MED ORDER — IOHEXOL 300 MG/ML  SOLN
100.0000 mL | Freq: Once | INTRAMUSCULAR | Status: AC | PRN
  Administered 2012-10-08: 125 mL via INTRAVENOUS

## 2012-10-13 ENCOUNTER — Telehealth: Payer: Self-pay | Admitting: *Deleted

## 2012-10-13 NOTE — Telephone Encounter (Signed)
Message copied by Anselm Jungling on Mon Oct 13, 2012 10:15 AM ------      Message from: Josph Macho      Created: Sun Oct 12, 2012  2:06 PM       Call:  The lymph nodes have all resolved and back to normal size!!  The spleen is normal in size!!   Excellent job!!  Consulting civil engineer ------

## 2012-10-13 NOTE — Telephone Encounter (Signed)
Called patient to let him know that his  Lymph nodes have all resolved and are back to normal size per dr. Myna Hidalgo.  The spleen is also normal in size on CT scan.

## 2012-10-15 ENCOUNTER — Other Ambulatory Visit (HOSPITAL_BASED_OUTPATIENT_CLINIC_OR_DEPARTMENT_OTHER): Payer: 59 | Admitting: Lab

## 2012-10-15 ENCOUNTER — Other Ambulatory Visit: Payer: Self-pay | Admitting: Pharmacist

## 2012-10-15 ENCOUNTER — Ambulatory Visit (HOSPITAL_BASED_OUTPATIENT_CLINIC_OR_DEPARTMENT_OTHER): Payer: 59 | Admitting: Hematology & Oncology

## 2012-10-15 VITALS — BP 126/74 | HR 59 | Temp 97.6°F | Resp 18 | Ht 68.0 in | Wt 251.0 lb

## 2012-10-15 DIAGNOSIS — C9110 Chronic lymphocytic leukemia of B-cell type not having achieved remission: Secondary | ICD-10-CM

## 2012-10-15 LAB — MANUAL DIFFERENTIAL (CHCC SATELLITE)
ALC: 0.6 10*3/uL — ABNORMAL LOW (ref 0.9–3.3)
ANC (CHCC HP manual diff): 4 10*3/uL (ref 1.5–6.5)
BASO: 1 % (ref 0–2)
Eos: 5 % (ref 0–7)
LYMPH: 11 % — ABNORMAL LOW (ref 14–48)
MONO: 9 % (ref 0–13)
PLT EST ~~LOC~~: ADEQUATE
SEG: 74 % (ref 40–75)

## 2012-10-15 LAB — CBC WITH DIFFERENTIAL (CANCER CENTER ONLY)
HCT: 43.4 % (ref 38.7–49.9)
HGB: 15.5 g/dL (ref 13.0–17.1)
MCH: 32.6 pg (ref 28.0–33.4)
MCHC: 35.7 g/dL (ref 32.0–35.9)
MCV: 91 fL (ref 82–98)
Platelets: 181 10*3/uL (ref 145–400)
RBC: 4.75 10*6/uL (ref 4.20–5.70)
RDW: 12.9 % (ref 11.1–15.7)
WBC: 5.4 10*3/uL (ref 4.0–10.0)

## 2012-10-15 LAB — CHCC SATELLITE - SMEAR

## 2012-10-15 MED ORDER — MEPERIDINE HCL 25 MG/ML IJ SOLN: 50.0000 mg | Freq: Once | INTRAMUSCULAR | Status: DC

## 2012-10-15 MED ORDER — MIDAZOLAM HCL 10 MG/2ML IJ SOLN: 10.0000 mg | Freq: Once | INTRAMUSCULAR | Status: DC

## 2012-10-15 NOTE — Progress Notes (Signed)
This office note has been dictated.

## 2012-10-16 NOTE — Progress Notes (Signed)
CC:   [name], M.D.  DIAGNOSIS:  Stage C chronic lymphocytic leukemia.  CURRENT THERAPY:  Patient is status post 6 cycles of bendamustine/Rituxan.  INTERIM HISTORY:  Mr. Earwood comes in for a followup.  He has completed all of his chemotherapy.  He got his last cycle back in February.  We did go ahead and repeat a CT scan of his chest, abdomen and pelvis, and neck.  He basically had resolution of his lymphadenopathy.  There is no splenomegaly.  He is working.  He is having no problems with this.  Next week he is going to go to the beach for his [place].  I know that he will enjoy this.  He has had no sweats.  He has not noticed any palpable lymph glands.  He has had no change in bowel or bladder habits.  He has had no fever, sweats, or chills.  He has had no rashes.  Mr. Baby tolerated chemotherapy incredibly well.  He had few, if any, side affects.  PHYSICAL EXAMINATION:  General:  This is a well-developed, well- nourished white gentleman in no obvious distress.  Vital signs: Temperature of 97.6, pulse 74, respiratory rate 18, blood pressure 126/74.  Weight is 251.  Head and neck:  Normocephalic, atraumatic skull.  There are no ocular or oral lesions.  No adenopathy is noted in the neck or supraclavicular regions.  Lungs:  Clear bilaterally. Cardiac:  Regular rate and rhythm with a normal S1 and S2.  There are no murmurs, rubs, or bruits.  Abdomen:  Soft with good bowel sounds.  There is no palpable abdominal mass.  There is no palpable hepatosplenomegaly. Extremities:  No clubbing, cyanosis, or edema.  Axillary:  No bilateral axillary adenopathy.  Inguinal:  No inguinal adenopathy.  Skin:  No rashes, ecchymosis, or petechia.  Neurological:  No focal neurological deficits.  LABORATORY STUDIES:  White cell count 5.4, hemoglobin 15.5, hematocrit 43.4, platelet count 181.  White cell differential shows 74 segs, 11 lymphs, 9 monos.  IMPRESSION:  Mr. Chanthavong is  a 57 year old gentleman with stage C chronic lymphocytic leukemia.  We went ahead and treated him.  He got 6 cycles of chemotherapy with Rituxan/bendamustine.  He completed his 6th cycle back on August 28, 2012.  We now need to get a bone marrow biopsy done on him.  I want see that he has cleared his bone marrow.  I think that he will have a "clean bone marrow."  He had a good cytogenetic marker.  He had a 13q abnormality.  This typically is a good prognostic marker for CLL.  We have the bone marrow biopsy set up for April 15th.  I will see Mr. Hoffmann back in 3 months.    ______________________________ Josph Macho, M.D. PRE/MEDQ  D:  10/15/2012  T:  10/16/2012  Job:  [id number]

## 2012-10-17 LAB — COMPREHENSIVE METABOLIC PANEL
ALT: 18 U/L (ref 0–53)
AST: 25 U/L (ref 0–37)
Albumin: 4.5 g/dL (ref 3.5–5.2)
Alkaline Phosphatase: 70 U/L (ref 39–117)
BUN: 12 mg/dL (ref 6–23)
CO2: 29 mEq/L (ref 19–32)
Calcium: 9.6 mg/dL (ref 8.4–10.5)
Chloride: 101 mEq/L (ref 96–112)
Creatinine, Ser: 1.13 mg/dL (ref 0.50–1.35)
Glucose, Bld: 105 mg/dL — ABNORMAL HIGH (ref 70–99)
Potassium: 3.8 mEq/L (ref 3.5–5.3)
Sodium: 138 mEq/L (ref 135–145)
Total Bilirubin: 0.6 mg/dL (ref 0.3–1.2)
Total Protein: 6.7 g/dL (ref 6.0–8.3)

## 2012-10-17 LAB — PROTEIN ELECTROPHORESIS, SERUM, WITH REFLEX
Albumin ELP: 63.2 % (ref 55.8–66.1)
Alpha-1-Globulin: 4.1 % (ref 2.9–4.9)
Alpha-2-Globulin: 11.9 % — ABNORMAL HIGH (ref 7.1–11.8)
Beta 2: 4.1 % (ref 3.2–6.5)
Beta Globulin: 6.5 % (ref 4.7–7.2)
Gamma Globulin: 10.2 % — ABNORMAL LOW (ref 11.1–18.8)
Total Protein, Serum Electrophoresis: 6.7 g/dL (ref 6.0–8.3)

## 2012-10-17 LAB — LACTATE DEHYDROGENASE: LDH: 250 U/L (ref 94–250)

## 2012-10-27 ENCOUNTER — Encounter (HOSPITAL_COMMUNITY): Payer: Self-pay | Admitting: Pharmacy Technician

## 2012-10-31 ENCOUNTER — Encounter: Payer: Self-pay | Admitting: *Deleted

## 2012-11-03 ENCOUNTER — Other Ambulatory Visit: Payer: Self-pay | Admitting: *Deleted

## 2012-11-03 ENCOUNTER — Other Ambulatory Visit: Payer: Self-pay | Admitting: Hematology & Oncology

## 2012-11-03 DIAGNOSIS — C9110 Chronic lymphocytic leukemia of B-cell type not having achieved remission: Secondary | ICD-10-CM

## 2012-11-04 ENCOUNTER — Ambulatory Visit (HOSPITAL_BASED_OUTPATIENT_CLINIC_OR_DEPARTMENT_OTHER): Payer: 59 | Admitting: Hematology & Oncology

## 2012-11-04 ENCOUNTER — Ambulatory Visit (HOSPITAL_COMMUNITY)
Admission: RE | Admit: 2012-11-04 | Discharge: 2012-11-04 | Disposition: A | Discharge status: Home / Self Care | Payer: 59 | Source: Ambulatory Visit | Attending: Hematology & Oncology | Admitting: Hematology & Oncology

## 2012-11-04 ENCOUNTER — Encounter (HOSPITAL_COMMUNITY): Payer: Self-pay

## 2012-11-04 VITALS — BP 117/72 | HR 54 | Temp 97.6°F | Resp 18

## 2012-11-04 DIAGNOSIS — C9110 Chronic lymphocytic leukemia of B-cell type not having achieved remission: Secondary | ICD-10-CM

## 2012-11-04 DIAGNOSIS — D72810 Lymphocytopenia: Secondary | ICD-10-CM | POA: Insufficient documentation

## 2012-11-04 DIAGNOSIS — Z9221 Personal history of antineoplastic chemotherapy: Secondary | ICD-10-CM | POA: Insufficient documentation

## 2012-11-04 LAB — CBC WITH DIFFERENTIAL/PLATELET
Basophils Absolute: 0.1 10*3/uL (ref 0.0–0.1)
Basophils Relative: 2 % — ABNORMAL HIGH (ref 0–1)
Eosinophils Absolute: 0.1 10*3/uL (ref 0.0–0.7)
Eosinophils Relative: 3 % (ref 0–5)
HCT: 42.6 % (ref 39.0–52.0)
Hemoglobin: 15.2 g/dL (ref 13.0–17.0)
Lymphocytes Relative: 9 % — ABNORMAL LOW (ref 12–46)
Lymphs Abs: 0.5 10*3/uL — ABNORMAL LOW (ref 0.7–4.0)
MCH: 32.3 pg (ref 26.0–34.0)
MCHC: 35.7 g/dL (ref 30.0–36.0)
MCV: 90.6 fL (ref 78.0–100.0)
Monocytes Absolute: 0.4 10*3/uL (ref 0.1–1.0)
Monocytes Relative: 8 % (ref 3–12)
Neutro Abs: 4.2 10*3/uL (ref 1.7–7.7)
Neutrophils Relative %: 79 % — ABNORMAL HIGH (ref 43–77)
Platelets: 169 10*3/uL (ref 150–400)
RBC: 4.7 MIL/uL (ref 4.22–5.81)
RDW: 12.7 % (ref 11.5–15.5)
WBC: 5.4 10*3/uL (ref 4.0–10.5)

## 2012-11-04 LAB — BONE MARROW EXAM: Bone Marrow Exam: 248

## 2012-11-04 MED ORDER — MEPERIDINE HCL 50 MG/ML IJ SOLN
25.0000 mg | Freq: Once | INTRAMUSCULAR | Status: AC
  Administered 2012-11-04: 25 mg via INTRAVENOUS
  Filled 2012-11-04: qty 1

## 2012-11-04 MED ORDER — SODIUM CHLORIDE 0.9 % IV SOLN
INTRAVENOUS | Status: DC
  Administered 2012-11-04: 500 mL via INTRAVENOUS

## 2012-11-04 MED ORDER — MIDAZOLAM HCL 10 MG/2ML IJ SOLN
10.0000 mg | Freq: Once | INTRAMUSCULAR | Status: AC
  Administered 2012-11-04: 5 mg via INTRAVENOUS
  Filled 2012-11-04: qty 2

## 2012-11-04 NOTE — Sedation Documentation (Signed)
Medication dose calculated and verified JXB:JYNWG Soller

## 2012-11-04 NOTE — ED Notes (Signed)
dsg applied by md, cdi

## 2012-11-04 NOTE — ED Notes (Signed)
Patient is resting comfortably. 

## 2012-11-04 NOTE — ED Notes (Signed)
Patient denies pain and is resting comfortably.  

## 2012-11-04 NOTE — ED Notes (Signed)
dsg cdi 

## 2012-11-04 NOTE — ED Notes (Signed)
Vital signs stable. 

## 2012-11-04 NOTE — ED Notes (Signed)
Family updated as to patient's status.  dsg cdi

## 2012-11-04 NOTE — ED Notes (Signed)
Family updated as to patient's status.

## 2012-11-04 NOTE — Progress Notes (Signed)
Bone Marrow Procedure Note:  The patient was brought to the Short Stay Unit.  He has an IV placed in his left arm. His Mallimpati score was 1.  His ASA class was 1. Time out was performed.  He was placed on his right side. He received 5 mg of Versed and 25mg  of Demerol for IV sedation.  The Left PIC region was prepped and draped in sterile fashion.  8cc of 2% lidocaine was infiltrated under the skin and down to the periosteum. A #11 scapel was used to make an incision into the skin.  2 marrow aspirates were obtained.  I then obtained an excellent marrow biopsy.  The incision site was cleaned and dressed sterilly.  The patient tolerated the procedure well.  There were no complications.   Charles E.

## 2012-11-04 NOTE — ED Notes (Signed)
Patient denies pain and is resting comfortably.    dsg  cdi

## 2012-11-12 LAB — CHROMOSOME ANALYSIS, BONE MARROW

## 2012-11-12 LAB — TISSUE HYBRIDIZATION (BONE MARROW)-NCBH

## 2012-11-17 ENCOUNTER — Encounter: Payer: Self-pay | Admitting: Hematology & Oncology

## 2012-11-28 ENCOUNTER — Other Ambulatory Visit: Payer: Self-pay | Admitting: Radiology

## 2012-11-28 NOTE — Telephone Encounter (Signed)
Patient requesting Rx for Nexium, please advise on this, patient has been getting form Dr Meade Maw in the past, new Rx from [place]. To [facility] pharmacy please. pended

## 2012-11-29 NOTE — Telephone Encounter (Signed)
Pt needs to get from PCP, looks like we have only seen him once for acute visit

## 2012-12-25 ENCOUNTER — Encounter: Payer: Self-pay | Admitting: Family Medicine

## 2012-12-25 ENCOUNTER — Ambulatory Visit (INDEPENDENT_AMBULATORY_CARE_PROVIDER_SITE_OTHER): Payer: 59 | Admitting: Family Medicine

## 2012-12-25 VITALS — BP 122/78 | HR 59 | Temp 98.0°F | Resp 16 | Ht 68.5 in | Wt 245.2 lb

## 2012-12-25 DIAGNOSIS — Z760 Encounter for issue of repeat prescription: Secondary | ICD-10-CM

## 2012-12-25 DIAGNOSIS — K219 Gastro-esophageal reflux disease without esophagitis: Secondary | ICD-10-CM

## 2012-12-25 DIAGNOSIS — F17200 Nicotine dependence, unspecified, uncomplicated: Secondary | ICD-10-CM | POA: Insufficient documentation

## 2012-12-25 DIAGNOSIS — E7800 Pure hypercholesterolemia, unspecified: Secondary | ICD-10-CM | POA: Insufficient documentation

## 2012-12-25 LAB — LIPID PANEL
Cholesterol: 136 mg/dL (ref 0–200)
HDL: 26 mg/dL — ABNORMAL LOW (ref >39)
LDL Cholesterol: 69 mg/dL (ref 0–99)
Total CHOL/HDL Ratio: 5.2 Ratio
Triglycerides: 207 mg/dL — ABNORMAL HIGH (ref <150)
VLDL: 41 mg/dL — ABNORMAL HIGH (ref 0–40)

## 2012-12-25 LAB — TSH: TSH: 1.819 u[IU]/mL (ref 0.350–4.500)

## 2012-12-25 MED ORDER — ESOMEPRAZOLE MAGNESIUM 40 MG PO CPDR: 40.0000 mg | DELAYED_RELEASE_CAPSULE | Freq: Every day | ORAL | Status: DC

## 2012-12-25 NOTE — Progress Notes (Signed)
S:  This 57 y.o. Cauc male is new to [facility], having received primary care services with Dr. Dewain Penning. He states he and wife are changing primary care to [facility]. He had one visit at 102 in Jan 2014 for URI. Pt is in treatment w/  Dr. Arlan Organ for CLL; this requires frequent labs (I have reviewed most recent labs from March and April 2014). Pt is here today for medication refill for Nexium. He reports GERD for > 15 years. Also taking statin (Rosuvastatin) for > 10 years but no recent lipids checked. He feels good and is trying to get some weight off. Reports eating only 1 meal daily (dinner); little appetite for meal in morning (may have peanut butter crackers). He tries to maintain good hydration. 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 Pt is a tobacco user for >20 years.  Patient Active Problem List   Diagnosis Date Noted  . GERD (gastroesophageal reflux disease) 12/25/2012  . Pure hypercholesterolemia 12/25/2012  . CLL (chronic lymphocytic leukemia) 03/18/2012    PMHx, Soc Hx and Fam Hx reviewed.  ROS: Negative for fatigue, diaphoresis, sinus congestion or sore throat, rhinorrhea, epistaxis, dysphagia or hoarseness, CP or tightness, palpitations, SOB or DOE, cough, GI problems, myalgias, back pain, HA, dizziness, weakness, numbness or syncope. No sleep disturbance or behavior changes.  O: Filed Vitals:   12/25/12 1007  BP: 122/78  Pulse: 59  Temp: 98 F (36.7 C)  Resp: 16   GEN: In NAD: WN,WD. HENT: [place]/AT; EOMI w/ clear conj/ sclerae. EACs/nose/oroph clear and unremarkable. COR: RRR.  LUNGS: Normla resp rate and effort. SKIN: W&D; pallor noted. MS: MAEs; no c/c/e. No deformities. NEURO: A&O x 3; CNs intact. Nonfocal.  A/P: GERD (gastroesophageal reflux disease)- refill Esomeprazole 40 mg 1 capsule every AM.  Pure hypercholesterolemia - pt on generic Crestor w/ no recent lipids in current EMR. Plan: Lipid panel, TSH  Issue of repeat  prescriptions  Meds ordered this encounter  Medications  . esomeprazole (NEXIUM) 40 MG capsule    Sig: Take 1 capsule (40 mg total) by mouth daily before breakfast.    Dispense:  90 capsule    Refill:  3

## 2012-12-25 NOTE — Patient Instructions (Addendum)
Continue current medications; next appointment in Dec 2014 will be for complete physical exam and update healthcare maintenance.

## 2012-12-27 ENCOUNTER — Encounter: Payer: Self-pay | Admitting: Family Medicine

## 2012-12-27 NOTE — Progress Notes (Signed)
Quick Note:  Please contact pt and advise that the following labs are abnormal... Lipids profile is abnormal with triglycerides above normal and HDL ("good") cholesterol too low. Continue current medications, work on better nutrition and regular exercise and lipids will be rechecked in next 3-6 months.  Thyroid test is normal. ______

## 2013-01-14 ENCOUNTER — Ambulatory Visit: Payer: 59 | Admitting: Hematology & Oncology

## 2013-01-14 ENCOUNTER — Other Ambulatory Visit: Payer: 59 | Admitting: Lab

## 2013-01-21 ENCOUNTER — Other Ambulatory Visit (HOSPITAL_BASED_OUTPATIENT_CLINIC_OR_DEPARTMENT_OTHER): Payer: 59 | Admitting: Lab

## 2013-01-21 ENCOUNTER — Ambulatory Visit (HOSPITAL_BASED_OUTPATIENT_CLINIC_OR_DEPARTMENT_OTHER): Payer: 59 | Admitting: Hematology & Oncology

## 2013-01-21 VITALS — BP 128/81 | HR 80 | Temp 98.2°F | Resp 18 | Ht 68.0 in | Wt 252.0 lb

## 2013-01-21 DIAGNOSIS — C9110 Chronic lymphocytic leukemia of B-cell type not having achieved remission: Secondary | ICD-10-CM

## 2013-01-21 LAB — LACTATE DEHYDROGENASE: LDH: 267 U/L — ABNORMAL HIGH (ref 94–250)

## 2013-01-21 LAB — CBC WITH DIFFERENTIAL (CANCER CENTER ONLY)
HCT: 41.7 % (ref 38.7–49.9)
HGB: 14.9 g/dL (ref 13.0–17.1)
MCH: 33.3 pg (ref 28.0–33.4)
MCHC: 35.7 g/dL (ref 32.0–35.9)
MCV: 93 fL (ref 82–98)
Platelets: 168 10*3/uL (ref 145–400)
RBC: 4.48 10*6/uL (ref 4.20–5.70)
RDW: 12.4 % (ref 11.1–15.7)
WBC: 6.8 10*3/uL (ref 4.0–10.0)

## 2013-01-21 LAB — MANUAL DIFFERENTIAL (CHCC SATELLITE)
ALC: 0.7 10*3/uL — ABNORMAL LOW (ref 0.9–3.3)
ANC (CHCC HP manual diff): 5.6 10*3/uL (ref 1.5–6.5)
Band Neutrophils: 2 % (ref 0–10)
LYMPH: 11 % — ABNORMAL LOW (ref 14–48)
MONO: 7 % (ref 0–13)
PLT EST ~~LOC~~: ADEQUATE
Platelet Morphology: NORMAL
SEG: 80 % — ABNORMAL HIGH (ref 40–75)

## 2013-01-21 LAB — COMPREHENSIVE METABOLIC PANEL
ALT: 17 U/L (ref 0–53)
AST: 25 U/L (ref 0–37)
Albumin: 4.1 g/dL (ref 3.5–5.2)
Alkaline Phosphatase: 87 U/L (ref 39–117)
BUN: 12 mg/dL (ref 6–23)
CO2: 27 mEq/L (ref 19–32)
Calcium: 9.3 mg/dL (ref 8.4–10.5)
Chloride: 102 mEq/L (ref 96–112)
Creatinine, Ser: 1.1 mg/dL (ref 0.50–1.35)
Glucose, Bld: 95 mg/dL (ref 70–99)
Potassium: 4 mEq/L (ref 3.5–5.3)
Sodium: 138 mEq/L (ref 135–145)
Total Bilirubin: 0.6 mg/dL (ref 0.3–1.2)
Total Protein: 6.5 g/dL (ref 6.0–8.3)

## 2013-01-21 NOTE — Progress Notes (Signed)
This office note has been dictated.

## 2013-01-26 NOTE — Progress Notes (Signed)
CC:   [facility], Fax [phone]  DIAGNOSIS:  Chronic lymphocytic leukemia, stage C.  CURRENT THERAPY:  The patient completed 6 cycles of chemotherapy with bendamustine/Rituxan in February 2014.  INTERIM HISTORY:  [name] comes in for his followup.  He is doing quite well.  We last saw back in March.  We went ahead and did a followup bone marrow biopsy and aspirate on him. This was done on April 16th.  The pathology report ([id number]) showed a normal cellular marrow.  No monoclonal lymphocytes were noted.  There were some small lymphoid aggregates.  We did do cytogenetics.  The cytogenetics did not reveal any abnormalities.  When we initially saw him, he had a loss of chromosome 13q.  Again, this is not present now.  As such, I would say [name] is in a molecular remission.  He is feeling well.  He is working without any difficulties.  There are no fevers, sweats, or chills.  He has had no rashes.  He has had no cough or shortness of breath.  He has had no fever.  He has had no change in bowel or bladder habits.  PHYSICAL EXAMINATION:  General:  Well-developed, well-nourished white gentleman in no obvious distress.  Vital Signs:  Temperature of 98.2, pulse 80, respiratory rate 16, blood pressure is 128/81.  Weight is 252. Head and Neck:  Normocephalic, atraumatic skull.  There are no ocular or oral lesions.  There are no palpable cervical or supraclavicular lymph nodes.  Lungs:  Clear bilaterally.  Cardiac:  Regular rate and rhythm with a normal S1, S2.  There are no murmurs, rubs, or bruits.  Abdomen: Soft with good bowel sounds.  There is no palpable abdominal mass. There is no fluid wave.  No palpable hepatosplenomegaly.  Back:  No tenderness over the spine, ribs, or hips.  Extremities:  No clubbing, cyanosis, or edema.  Axillary:  No bilateral axillary adenopathy.  LABORATORY STUDIES:  White cell count is 6.8, hemoglobin 14.9, hematocrit 41.7, platelet count  is 168.  IMPRESSION:  [name] is a nice 57 year old gentleman with stage C chronic lymphocytic leukemia.  He is in remission now.  I think we can probably get him back in 3 or 4 months.  I do not see any need for blood work in between visits.  I think the fact that his cytogenetics and FISH studies are normal now is very reassuring.    ______________________________ Charles [name], M.D. PRE/MEDQ  D:  01/21/2013  T:  01/22/2013  Job:  [id number]

## 2013-02-14 ENCOUNTER — Ambulatory Visit (INDEPENDENT_AMBULATORY_CARE_PROVIDER_SITE_OTHER): Payer: 59 | Admitting: Emergency Medicine

## 2013-02-14 VITALS — BP 124/86 | HR 62 | Temp 97.6°F | Resp 18 | Ht 68.5 in | Wt 248.0 lb

## 2013-02-14 DIAGNOSIS — L723 Sebaceous cyst: Secondary | ICD-10-CM

## 2013-02-14 NOTE — Progress Notes (Signed)
[facility] [address], [place] [address] [phone]  Date:  02/14/2013   Name:  Charles [name]   DOB:  1955/12/02   MRN:  [id number]  PCP:  Herb Grays, MD    Chief Complaint: Back Pain   History of Present Illness:  Charles [name] is a 57 y.o. very pleasant male patient who presents with the following:  Has a painful mass on the upper back.  No drainage or redness.  No fever or chills.  Denies other complaint or health concern today.   Patient Active Problem List   Diagnosis Date Noted  . GERD (gastroesophageal reflux disease) 12/25/2012  . Pure hypercholesterolemia 12/25/2012  . Tobacco use disorder 12/25/2012  . CLL (chronic lymphocytic leukemia) 03/18/2012    Past Medical History  Diagnosis Date  . CLL (chronic lymphoblastic leukemia) 08/03/2011  . Shingles   . CLL (chronic lymphocytic leukemia) 03/18/2012  . Ulcer   . GERD (gastroesophageal reflux disease)     Past Surgical History  Procedure Laterality Date  . Knee surgery    . Shoulder surgery    . Hernia repair    . Wrist surgery      History  Substance Use Topics  . Smoking status: Former Games developer  . Smokeless tobacco: Not on file     Comment: quit smoking 15 years ago  . Alcohol Use: Yes    Family History  Problem Relation Age of Onset  . Cancer Brother     No Known Allergies  Medication list has been reviewed and updated.  Current Outpatient Prescriptions on File Prior to Visit  Medication Sig Dispense Refill  . aspirin 81 MG tablet Take 81 mg by mouth daily.      . cetirizine (ZYRTEC) 10 MG tablet Take 10 mg by mouth daily.      . Cholecalciferol (VITAMIN D3) 2000 UNITS TABS Take 2,000 Units by mouth.      . esomeprazole (NEXIUM) 40 MG capsule Take 1 capsule (40 mg total) by mouth daily before breakfast.  90 capsule  3  . famciclovir (FAMVIR) 500 MG tablet Take 500 mg by mouth every morning.      . rosuvastatin (CRESTOR) 10 MG tablet Take 1 tablet (10 mg  total) by mouth every morning.  90 tablet  3   No current facility-administered medications on file prior to visit.    Review of Systems:  As per HPI, otherwise negative.    Physical Examination: Filed Vitals:   02/14/13 1818  BP: 124/86  Pulse: 62  Temp: 97.6 F (36.4 C)  Resp: 18   Filed Vitals:   02/14/13 1818  Height: 5' 8.5" (1.74 m)  Weight: 248 lb (112.492 kg)   Body mass index is 37.16 kg/(m^2). Ideal Body Weight: Weight in (lb) to have BMI = 25: 166.5   GEN: WDWN, NAD, Non-toxic, Alert & Oriented x 3 HEENT: Atraumatic, Normocephalic.  Ears and Nose: No external deformity. EXTR: No clubbing/cyanosis/edema NEURO: Normal gait.  PSYCH: Normally interactive. Conversant. Not depressed or anxious appearing.  Calm demeanor.  BACK:  Sebaceous cyst 2 cm  Assessment and Plan: Sebaceous cyst Pain as reason for removal Come to 104 for excision   Signed,  Phillips Odor, MD

## 2013-02-14 NOTE — Patient Instructions (Addendum)

## 2013-03-05 ENCOUNTER — Ambulatory Visit (INDEPENDENT_AMBULATORY_CARE_PROVIDER_SITE_OTHER): Payer: 59 | Admitting: Physician Assistant

## 2013-03-05 ENCOUNTER — Encounter: Payer: Self-pay | Admitting: Physician Assistant

## 2013-03-05 VITALS — BP 134/78 | HR 67 | Temp 98.5°F | Resp 16 | Ht 69.0 in | Wt 253.8 lb

## 2013-03-05 DIAGNOSIS — L723 Sebaceous cyst: Secondary | ICD-10-CM

## 2013-03-05 NOTE — Patient Instructions (Signed)

## 2013-03-05 NOTE — Progress Notes (Signed)
  Subjective:    Patient ID: Charles [name], male    DOB: May 04, 1956, 57 y.o.   MRN: [id number]  HPI This 57 y.o. male presents for excision of a sebaceous cyst from the back.  This was evaluated several weeks ago in our walk in clinic and he was advised to schedule the excision at his convenience at the appointment center.  The area has gotten bigger and is more uncomfortable.  Patient Active Problem List   Diagnosis Date Noted  . GERD (gastroesophageal reflux disease) 12/25/2012  . Pure hypercholesterolemia 12/25/2012  . Tobacco use disorder 12/25/2012  . CLL (chronic lymphocytic leukemia) 03/18/2012   Current Outpatient Prescriptions on File Prior to Visit  Medication Sig Dispense Refill  . aspirin 81 MG tablet Take 81 mg by mouth daily.      . cetirizine (ZYRTEC) 10 MG tablet Take 10 mg by mouth daily.      . Cholecalciferol (VITAMIN D3) 2000 UNITS TABS Take 2,000 Units by mouth.      . esomeprazole (NEXIUM) 40 MG capsule Take 1 capsule (40 mg total) by mouth daily before breakfast.  90 capsule  3  . famciclovir (FAMVIR) 500 MG tablet Take 500 mg by mouth every morning.      . rosuvastatin (CRESTOR) 10 MG tablet Take 1 tablet (10 mg total) by mouth every morning.  90 tablet  3   No current facility-administered medications on file prior to visit.   No Known Allergies  Past medical history, surgical history, family history, social history and problem list reviewed.   Review of Systems As above    Objective:   Physical Exam BP 134/78  Pulse 67  Temp(Src) 98.5 F (36.9 C) (Oral)  Resp 16  Ht 5\' 9"  (1.753 m)  Wt 253 lb 12.8 oz (115.123 kg)  BMI 37.46 kg/m2  SpO2 96%  Verbal consent obtained from the patient.  Local anesthesia with 2cc Lidocaine 2% with epinephrine.  Sterile prep and drape.  Elliptical excision with 15 blade.  Cyst and sac excised en toto.  Wound closed with #3 4-0 Prolene simple interrupted sutures.  Wound cleansed and dressed. The patient tolerated the  procedure well.        Assessment & Plan:  Sebaceous cyst Local wound care.  RTC 10 days for suture removal.  Fernande Bras, PA-C Physician Assistant-Certified [facility]

## 2013-03-14 ENCOUNTER — Ambulatory Visit (INDEPENDENT_AMBULATORY_CARE_PROVIDER_SITE_OTHER): Payer: 59 | Admitting: Physician Assistant

## 2013-03-14 VITALS — BP 141/75 | HR 57 | Temp 98.2°F | Resp 18 | Ht 68.5 in | Wt 255.0 lb

## 2013-03-14 DIAGNOSIS — S21209D Unspecified open wound of unspecified back wall of thorax without penetration into thoracic cavity, subsequent encounter: Secondary | ICD-10-CM

## 2013-03-14 DIAGNOSIS — Z5189 Encounter for other specified aftercare: Secondary | ICD-10-CM

## 2013-03-14 NOTE — Progress Notes (Signed)
   [address], [place] [address]   Phone [phone]  Subjective:    Patient ID: Charles [name], male    DOB: May 29, 1956, 56 y.o.   MRN: [id number]  HPI  Pt presents to clinic for suture removal.  He has had no problems with the area other than he is itching really bad over the last couple of days.  Review of Systems     Objective:   Physical Exam  Vitals reviewed. Constitutional: He is oriented to person, place, and time. He appears well-developed and well-nourished.  HENT:  Head: Normocephalic and atraumatic.  Right Ear: External ear normal.  Left Ear: External ear normal.  Eyes: Conjunctivae are normal.  Neck: Normal range of motion.  Pulmonary/Chest: Effort normal.  Neurological: He is alert and oriented to person, place, and time.  Skin: Skin is warm and dry.  Well healed wound on his upper back.  #3 simple sutures removed without difficulty.  Psychiatric: He has a normal mood and affect. His behavior is normal. Judgment and thought content normal.          Assessment & Plan:  Wound of back, unspecified laterality, subsequent encounter  Benny Lennert PA-C 03/14/2013 11:15 AM

## 2013-04-23 ENCOUNTER — Ambulatory Visit (HOSPITAL_BASED_OUTPATIENT_CLINIC_OR_DEPARTMENT_OTHER): Payer: 59 | Admitting: Hematology & Oncology

## 2013-04-23 ENCOUNTER — Other Ambulatory Visit (HOSPITAL_BASED_OUTPATIENT_CLINIC_OR_DEPARTMENT_OTHER): Payer: 59 | Admitting: Lab

## 2013-04-23 VITALS — BP 127/72 | HR 60 | Temp 97.7°F | Resp 18 | Ht 68.0 in | Wt 225.0 lb

## 2013-04-23 DIAGNOSIS — C9110 Chronic lymphocytic leukemia of B-cell type not having achieved remission: Secondary | ICD-10-CM

## 2013-04-23 LAB — CBC WITH DIFFERENTIAL (CANCER CENTER ONLY)
BASO#: 0.1 10*3/uL (ref 0.0–0.2)
BASO%: 1.8 % (ref 0.0–2.0)
EOS%: 4.3 % (ref 0.0–7.0)
Eosinophils Absolute: 0.2 10*3/uL (ref 0.0–0.5)
HCT: 43.2 % (ref 38.7–49.9)
HGB: 15.3 g/dL (ref 13.0–17.1)
LYMPH#: 0.6 10*3/uL — ABNORMAL LOW (ref 0.9–3.3)
LYMPH%: 11.8 % — ABNORMAL LOW (ref 14.0–48.0)
MCH: 32.9 pg (ref 28.0–33.4)
MCHC: 35.4 g/dL (ref 32.0–35.9)
MCV: 93 fL (ref 82–98)
MONO#: 0.5 10*3/uL (ref 0.1–0.9)
MONO%: 10.4 % (ref 0.0–13.0)
NEUT#: 3.7 10*3/uL (ref 1.5–6.5)
NEUT%: 71.7 % (ref 40.0–80.0)
Platelets: 161 10*3/uL (ref 145–400)
RBC: 4.65 10*6/uL (ref 4.20–5.70)
RDW: 12.3 % (ref 11.1–15.7)
WBC: 5.1 10*3/uL (ref 4.0–10.0)

## 2013-04-23 NOTE — Progress Notes (Signed)
This office note has been dictated.

## 2013-04-24 NOTE — Progress Notes (Signed)
CC:   Maurice March, M.D.  DIAGNOSIS:  Chronic lymphocytic leukemia -- stage C -- clinical remission.  CURRENT THERAPY:  Observation.  INTERIM HISTORY:  Mr. Seidman comes in for his followup.  He is doing great.  He has had no problems since we last saw him.  He had a cyst removed from his back, back in August I think.  There was no problems with this.  He has had no fevers, sweats, or chills.  He has had no change in bowel or bladder habits.  He is still working over at [employer].  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  He has had no bleeding.  There has been no leg swelling.  He has had no rashes.  There has been no change in his medications.  He continues on aspirin and vitamin D.  PHYSICAL EXAMINATION:  General:  This is a well-developed, well- nourished white gentleman in no obvious distress.  Vital signs: Temperature 97.7, pulse 60, respiratory rate 18, blood pressure 127/72. Weight is 225 pounds.  Head and neck:  Normocephalic, atraumatic skull. There are no ocular or oral lesions.  There are no palpable cervical or supraclavicular lymph nodes.  Lungs:  Clear bilaterally.  Cardiac: Regular rate and rhythm with a normal S1 and S2.  There are no murmurs, rubs or bruits.  Abdomen:  Soft.  He has good bowel sounds.  There is no fluid wave.  There is no palpable hepatosplenomegaly.  Extremities:  No clubbing, cyanosis or edema.  Neurologic:  No focal neurological deficits.  LABORATORY STUDIES:  White cell count 5.1, hemoglobin 15.3, hematocrit 43.2, platelet count 161.  White cell differential shows 72 segs, 12 lymphs and 10 monos.  I looked at his blood smear.  I do not see any atypical lymphocytes.  He has no rouleaux formation.  There is no spherocytes or schistocytes. Platelets were adequate in number and size.  IMPRESSION:  Mr. Humm is a very nice 57 year old gentleman with stage C chronic lymphocytic leukemia.  We went ahead and treated  him with Rituxan/bendamustine.  He completed treatment back in February of 2014.  I do not see anything that shows a recurrence at this point time.  We will plan to get him back in 4 months.  We will get him through the holidays.    ______________________________ Josph Macho, M.D. PRE/MEDQ  D:  04/23/2013  T:  04/24/2013  Job:  [id number]

## 2013-04-27 LAB — IGG, IGA, IGM
IgA: 85 mg/dL (ref 68–379)
IgG (Immunoglobin G), Serum: 718 mg/dL (ref 650–1600)
IgM, Serum: 26 mg/dL — ABNORMAL LOW (ref 41–251)

## 2013-04-27 LAB — PROTEIN ELECTROPHORESIS, SERUM, WITH REFLEX
Albumin ELP: 60.9 % (ref 55.8–66.1)
Alpha-1-Globulin: 6.7 % — ABNORMAL HIGH (ref 2.9–4.9)
Alpha-2-Globulin: 11.5 % (ref 7.1–11.8)
Beta 2: 4.1 % (ref 3.2–6.5)
Beta Globulin: 6.6 % (ref 4.7–7.2)
Gamma Globulin: 10.2 % — ABNORMAL LOW (ref 11.1–18.8)
Total Protein, Serum Electrophoresis: 6.4 g/dL (ref 6.0–8.3)

## 2013-05-28 ENCOUNTER — Other Ambulatory Visit: Payer: Self-pay

## 2013-07-01 ENCOUNTER — Ambulatory Visit (INDEPENDENT_AMBULATORY_CARE_PROVIDER_SITE_OTHER): Payer: 59 | Admitting: Family Medicine

## 2013-07-01 VITALS — BP 152/90 | HR 64 | Temp 98.5°F | Resp 16 | Ht 69.0 in | Wt 249.0 lb

## 2013-07-01 DIAGNOSIS — H9202 Otalgia, left ear: Secondary | ICD-10-CM

## 2013-07-01 DIAGNOSIS — H9209 Otalgia, unspecified ear: Secondary | ICD-10-CM

## 2013-07-01 DIAGNOSIS — H73019 Bullous myringitis, unspecified ear: Secondary | ICD-10-CM

## 2013-07-01 DIAGNOSIS — H73012 Bullous myringitis, left ear: Secondary | ICD-10-CM

## 2013-07-01 MED ORDER — AZITHROMYCIN 250 MG PO TABS: ORAL_TABLET | ORAL | Status: DC

## 2013-07-01 MED ORDER — TRAMADOL HCL 50 MG PO TABS: 50.0000 mg | ORAL_TABLET | Freq: Four times a day (QID) | ORAL | Status: DC | PRN

## 2013-07-01 NOTE — Progress Notes (Signed)
Subjective:    Patient ID: Charles [name], male    DOB: 03/21/1956, 57 y.o.   MRN: [id number] This chart was scribed for Charles Staggers, MD by Charles [name], Medical Scribe. This patient's care was started at 8:54 AM.   HPI HPI COMMENTS:  Charles [name] is 57 y.o. male who presents to the office complaining of sore throat, ear pain onset three days ago - more ear pain since last night. Pt reports associated left ear ringing that started last night, intermittent headache, and a dry cough. Pt has taken tylenol with little relief. Does have some decreased hearing on Left. denies congestion, fever,  or rhinorrhea. Slight pnd with min cough. Pt denies sick contacts.   Patient Active Problem List   Diagnosis Date Noted  . GERD (gastroesophageal reflux disease) 12/25/2012  . Pure hypercholesterolemia 12/25/2012  . Tobacco use disorder 12/25/2012  . CLL (chronic lymphocytic leukemia) 03/18/2012   Past Medical History  Diagnosis Date  . CLL (chronic lymphoblastic leukemia) 08/03/2011  . Shingles   . CLL (chronic lymphocytic leukemia) 03/18/2012  . Ulcer   . GERD (gastroesophageal reflux disease)    Past Surgical History  Procedure Laterality Date  . Knee surgery    . Shoulder surgery    . Hernia repair    . Wrist surgery     No Known Allergies Prior to Admission medications   Medication Sig Start Date End Date Taking? Authorizing Provider  aspirin 81 MG tablet Take 81 mg by mouth daily.   Yes Historical Provider, MD  cetirizine (ZYRTEC) 10 MG tablet Take 10 mg by mouth daily.   Yes Historical Provider, MD  Cholecalciferol (VITAMIN D3) 2000 UNITS TABS Take 2,000 Units by mouth.   Yes Historical Provider, MD  esomeprazole (NEXIUM) 40 MG capsule Take 1 capsule (40 mg total) by mouth daily before breakfast. 12/25/12  Yes Charles March, MD  famciclovir (FAMVIR) 500 MG tablet Take 500 mg by mouth every morning.   Yes Historical Provider, MD  rosuvastatin (CRESTOR) 10 MG tablet  Take 1 tablet (10 mg total) by mouth every morning. 08/06/12  Yes Charles Cables, MD   History   Social History  . Marital Status: Married    Spouse Name: N/A    Number of Children: N/A  . Years of Education: N/A   Occupational History  . Not on file.   Social History Main Topics  . Smoking status: Former Games developer  . Smokeless tobacco: Not on file     Comment: quit smoking 15 years ago  . Alcohol Use: Yes  . Drug Use: No  . Sexual Activity: Yes    Partners: Female    Pharmacist, hospital Protection: None   Other Topics Concern  . Not on file   Social History Narrative   Married. Education: [employer]. Exercise: Walk and bike.   Review of Systems  Constitutional: Negative for fever.  HENT: Positive for ear pain, sore throat and tinnitus. Negative for congestion, facial swelling, hearing loss, postnasal drip and rhinorrhea.   Respiratory: Positive for cough.   Skin: Negative for rash.       Objective:   Physical Exam  Vitals reviewed. Constitutional: He is oriented to person, place, and time. He appears well-developed and well-nourished.  HENT:  Head: Normocephalic and atraumatic.  Right Ear: Tympanic membrane, external ear and ear canal normal. Tympanic membrane is not erythematous. No middle ear effusion.  Left Ear: External ear and ear canal normal. Tympanic membrane is erythematous  and retracted.  Nose: No rhinorrhea.  Mouth/Throat: Mucous membranes are normal. Posterior oropharyngeal erythema (very minimal) present. No oropharyngeal exudate.  Tonsils without hypertrophy. R TM pearly gray. L ear pinna non-tender. Canal clear. L TM is dull. Small bulla centrally and one the inferior aspect of TM. Minimal clear fluid at inferior aspect. L face no external rash on skin or external ear No dysesthesia of external ear  Eyes: Conjunctivae are normal. Pupils are equal, round, and reactive to light.  Neck: Neck supple.  Cardiovascular: Normal rate, regular rhythm, normal heart  sounds and intact distal pulses.   No murmur heard. Pulmonary/Chest: Effort normal and breath sounds normal. He has no wheezes. He has no rhonchi. He has no rales.  Abdominal: Soft. There is no tenderness.  Lymphadenopathy:    He has no cervical adenopathy.  Neurological: He is alert and oriented to person, place, and time.  Skin: Skin is warm and dry. No rash noted.  Psychiatric: He has a normal mood and affect. His behavior is normal.    Filed Vitals:   07/01/13 0810  BP: 152/90  Pulse: 64  Temp: 98.5 F (36.9 C)  TempSrc: Oral  Resp: 16  Height: 5\' 9"  (1.753 m)  Weight: 249 lb (112.946 kg)  SpO2: 98%         Assessment & Plan:   DAIMEN SHOVLIN is a 57 y.o. male Left ear pain - Plan: azithromycin (ZITHROMAX) 250 MG tablet, traMADol (ULTRAM) 50 MG tablet  Bullous myringitis of left ear - Plan: azithromycin (ZITHROMAX) 250 MG tablet  possibel underlying URI/viral illness, but with suspected bullous myringitis - start Z apk to cover mycoplasma as well.  No canal or external rash, so doubt Zoster/Ramsay Hunt, but RTC precautions discussed and recheck tomorrow with Dr. Audria Nine. Ultram if needed for pain if unrelieved with tylenol.  Meds ordered this encounter  Medications  . azithromycin (ZITHROMAX) 250 MG tablet    Sig: Take 2 pills by mouth on day 1, then 1 pill by mouth per day on days 2 through 5.    Dispense:  6 each    Refill:  0  . traMADol (ULTRAM) 50 MG tablet    Sig: Take 1 tablet (50 mg total) by mouth every 6 (six) hours as needed.    Dispense:  20 tablet    Refill:  0   Patient Instructions  Your left ear appears to have an infection called Bullous Myringitis. This can be from virus or bacteria, and sometimes form bacteria called Mycoplasma. Will start antibiotic and recheck with Dr. Audria Nine tomorrow. If any rash on outside of ear or face, this would change treatment. Return to the clinic or go to the nearest emergency room if any of your symptoms  worsen or new symptoms occur. Bullous Myringitis You have an infection of the ear drum called myringitis. Bullous means blisters have formed. These can produce clear or slightly bloody ear drainage. This infection can be caused by both viruses and germs (bacteria). Symptoms of myringitis include severe earache, slight hearing loss, and clear or bloody drainage from the ear. It is different from most ear infections in that there is no fluid trapped behind the ear drum. Treatment often includes antibiotic ear drops and pain medicine. Sometimes an oral antibiotic may be prescribed. Until the infection is resolved, you should keep water from entering your ear by plugging it with a cotton ball covered with petroleum jelly when you shower.  SEEK MEDICAL CARE IF:  You  develop a cough or other symptoms of a respiratory infection.  Your symptoms are not improving after 2 days of treatment.  You develop a severe headache or stiff neck. Document Released: 08/16/2004 Document Revised: 10/01/2011 Document Reviewed: 07/06/2008 [facility] Patient Information 2014 [place], [place].     I personally performed the services described in this documentation, which was scribed in my presence. The recorded information has been reviewed and considered, and addended by me as needed.

## 2013-07-01 NOTE — Patient Instructions (Signed)
Your left ear appears to have an infection called Bullous Myringitis. This can be from virus or bacteria, and sometimes form bacteria called Mycoplasma. Will start antibiotic and recheck with Dr. Audria Nine tomorrow. If any rash on outside of ear or face, this would change treatment. Return to the clinic or go to the nearest emergency room if any of your symptoms worsen or new symptoms occur. Bullous Myringitis You have an infection of the ear drum called myringitis. Bullous means blisters have formed. These can produce clear or slightly bloody ear drainage. This infection can be caused by both viruses and germs (bacteria). Symptoms of myringitis include severe earache, slight hearing loss, and clear or bloody drainage from the ear. It is different from most ear infections in that there is no fluid trapped behind the ear drum. Treatment often includes antibiotic ear drops and pain medicine. Sometimes an oral antibiotic may be prescribed. Until the infection is resolved, you should keep water from entering your ear by plugging it with a cotton ball covered with petroleum jelly when you shower.  SEEK MEDICAL CARE IF:  You develop a cough or other symptoms of a respiratory infection.  Your symptoms are not improving after 2 days of treatment.  You develop a severe headache or stiff neck. Document Released: 08/16/2004 Document Revised: 10/01/2011 Document Reviewed: 07/06/2008 [facility] Patient Information 2014 [place], [place].

## 2013-07-02 ENCOUNTER — Ambulatory Visit (INDEPENDENT_AMBULATORY_CARE_PROVIDER_SITE_OTHER): Payer: 59 | Admitting: Family Medicine

## 2013-07-02 ENCOUNTER — Encounter: Payer: Self-pay | Admitting: Family Medicine

## 2013-07-02 VITALS — BP 140/80 | HR 62 | Temp 97.6°F | Resp 16 | Ht 68.5 in | Wt 247.2 lb

## 2013-07-02 DIAGNOSIS — Z0000 Encounter for general adult medical examination without abnormal findings: Secondary | ICD-10-CM

## 2013-07-02 LAB — POCT SEDIMENTATION RATE: POCT SED RATE: 13 mm/hr (ref 0–22)

## 2013-07-02 LAB — COMPREHENSIVE METABOLIC PANEL
ALT: 18 U/L (ref 0–53)
AST: 21 U/L (ref 0–37)
Albumin: 4.6 g/dL (ref 3.5–5.2)
Alkaline Phosphatase: 99 U/L (ref 39–117)
BUN: 13 mg/dL (ref 6–23)
CO2: 28 mEq/L (ref 19–32)
Calcium: 9.6 mg/dL (ref 8.4–10.5)
Chloride: 101 mEq/L (ref 96–112)
Creat: 1.04 mg/dL (ref 0.50–1.35)
Glucose, Bld: 90 mg/dL (ref 70–99)
Potassium: 4.1 mEq/L (ref 3.5–5.3)
Sodium: 137 mEq/L (ref 135–145)
Total Bilirubin: 0.8 mg/dL (ref 0.3–1.2)
Total Protein: 6.9 g/dL (ref 6.0–8.3)

## 2013-07-02 LAB — POCT URINALYSIS DIPSTICK
Glucose, UA: NEGATIVE
Ketones, UA: NEGATIVE
Leukocytes, UA: NEGATIVE
Nitrite, UA: NEGATIVE
Protein, UA: NEGATIVE
Spec Grav, UA: >=1.03
Urobilinogen, UA: 0.2
pH, UA: 5

## 2013-07-02 LAB — CBC
HCT: 46.1 % (ref 39.0–52.0)
Hemoglobin: 16.3 g/dL (ref 13.0–17.0)
MCH: 32.6 pg (ref 26.0–34.0)
MCHC: 35.4 g/dL (ref 30.0–36.0)
MCV: 92.2 fL (ref 78.0–100.0)
Platelets: 200 10*3/uL (ref 150–400)
RBC: 5 MIL/uL (ref 4.22–5.81)
RDW: 13.9 % (ref 11.5–15.5)
WBC: 10.5 10*3/uL (ref 4.0–10.5)

## 2013-07-02 LAB — TSH: TSH: 2.042 u[IU]/mL (ref 0.350–4.500)

## 2013-07-02 LAB — IFOBT (OCCULT BLOOD): IFOBT: NEGATIVE

## 2013-07-02 LAB — LIPID PANEL
Cholesterol: 130 mg/dL (ref 0–200)
HDL: 31 mg/dL — ABNORMAL LOW (ref >39)
LDL Cholesterol: 75 mg/dL (ref 0–99)
Total CHOL/HDL Ratio: 4.2 Ratio
Triglycerides: 118 mg/dL (ref <150)
VLDL: 24 mg/dL (ref 0–40)

## 2013-07-02 NOTE — Progress Notes (Signed)
   Subjective:    Patient ID: Charles [name], male    DOB: Jun 10, 1956, 57 y.o.   MRN: [id number]  HPI Is a 57 year old man, married, works for maintenance at [employer]. He has 2 sons HPF I. and 8 and a grandson daughter who is 33 years old. He's had problems with CLL and has been treated with chemotherapy by Dr. ever. He's had some chronic finger swelling sounds with stiffness which she attributes to the chemotherapy.  Patient also had hernia repair in the right, has hyperlipidemia, problems with obesity and most recently your pain with a middle ear infection on the left. 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   HENT: Positive for ear pain, hearing loss and sore throat.   Eyes: Negative.   Respiratory: Negative.   Cardiovascular: Negative.   Gastrointestinal: Negative.   Endocrine: Negative.   Genitourinary: Negative.   Musculoskeletal: Negative.   Skin: Negative.   Allergic/Immunologic: Positive for environmental allergies.  Neurological: Negative.   Hematological: Negative.   Psychiatric/Behavioral: Negative.        Objective:   Physical Exam No acute distress Eyes: Normal fundi, EOM, pupils equal reactive Ears: Bulging left TM was red malleus and decreased hearing bilaterally, more profound in the left. Oropharynx: No acute lesions, no leukoplakia, posterior pharynx normal Neck: Supple no adenopathy, thyromegaly, or bruits Chest: Clear to auscultation, multiple skin tags on the scapular areas of his back Heart: Regular no murmur or gallop Abdomen: Soft nontender without HSM or masses, right hernia scar in right lower quadrant Genitalia: Normal testes, no hernia, circumcised male Rectal exam: Firm prostate which is minimally enlarged without nodularity Extremities: Moderate  swelling of all 8 fingers with incomplete flexion     Assessment & Plan:  Annual physical exam - Plan: CBC, Comprehensive metabolic panel, Lipid panel, PSA, TSH, POCT urinalysis dipstick, POCT SEDIMENTATION RATE, Ambulatory referral to Gastroenterology  Signed, Elvina Sidle, MD  Results for orders placed in visit on 07/02/13  POCT URINALYSIS DIPSTICK      Result Value Range   Color, UA yellow     Clarity, UA clear     Glucose, UA neg     Bilirubin, UA small     Ketones, UA neg     Spec Grav, UA >=1.030     Blood, UA small     pH, UA 5.0     Protein, UA neg     Urobilinogen, UA 0.2     Nitrite, UA neg     Leukocytes, UA Negative    IFOBT (OCCULT BLOOD)      Result Value Range   IFOBT Negative

## 2013-07-02 NOTE — Patient Instructions (Signed)
Health Maintenance, Males A healthy lifestyle and preventative care can promote health and wellness.  Maintain regular health, dental, and eye exams.  Eat a healthy diet. Foods like vegetables, fruits, whole grains, low-fat dairy products, and lean protein foods contain the nutrients you need without too many calories. Decrease your intake of foods high in solid fats, added sugars, and salt. Get information about a proper diet from your caregiver, if necessary.  Regular physical exercise is one of the most important things you can do for your health. Most adults should get at least 150 minutes of moderate-intensity exercise (any activity that increases your heart rate and causes you to sweat) each week. In addition, most adults need muscle-strengthening exercises on 2 or more days a week.   Maintain a healthy weight. The body mass index (BMI) is a screening tool to identify possible weight problems. It provides an estimate of body fat based on height and weight. Your caregiver can help determine your BMI, and can help you achieve or maintain a healthy weight. For adults 20 years and older:  A BMI below 18.5 is considered underweight.  A BMI of 18.5 to 24.9 is normal.  A BMI of 25 to 29.9 is considered overweight.  A BMI of 30 and above is considered obese.  Maintain normal blood lipids and cholesterol by exercising and minimizing your intake of saturated fat. Eat a balanced diet with plenty of fruits and vegetables. Blood tests for lipids and cholesterol should begin at age 20 and be repeated every 5 years. If your lipid or cholesterol levels are high, you are over 50, or you are a high risk for heart disease, you may need your cholesterol levels checked more frequently.Ongoing high lipid and cholesterol levels should be treated with medicines, if diet and exercise are not effective.  If you smoke, find out from your caregiver how to quit. If you do not use tobacco, do not start.  Lung  cancer screening is recommended for adults aged 55 80 years who are at high risk for developing lung cancer because of a history of smoking. Yearly low-dose computed tomography (CT) is recommended for people who have at least a 30-pack-year history of smoking and are a current smoker or have quit within the past 15 years. A pack year of smoking is smoking an average of 1 pack of cigarettes a day for 1 year (for example: 1 pack a day for 30 years or 2 packs a day for 15 years). Yearly screening should continue until the smoker has stopped smoking for at least 15 years. Yearly screening should also be stopped for people who develop a health problem that would prevent them from having lung cancer treatment.  If you choose to drink alcohol, do not exceed 2 drinks per day. One drink is considered to be 12 ounces (355 mL) of beer, 5 ounces (148 mL) of wine, or 1.5 ounces (44 mL) of liquor.  Avoid use of street drugs. Do not share needles with anyone. Ask for help if you need support or instructions about stopping the use of drugs.  High blood pressure causes heart disease and increases the risk of stroke. Blood pressure should be checked at least every 1 to 2 years. Ongoing high blood pressure should be treated with medicines if weight loss and exercise are not effective.  If you are 45 to 58 years old, ask your caregiver if you should take aspirin to prevent heart disease.  Diabetes screening involves taking a blood   sample to check your fasting blood sugar level. This should be done once every 3 years, after age 45, if you are within normal weight and without risk factors for diabetes. Testing should be considered at a younger age or be carried out more frequently if you are overweight and have at least 1 risk factor for diabetes.  Colorectal cancer can be detected and often prevented. Most routine colorectal cancer screening begins at the age of 50 and continues through age 75. However, your caregiver may  recommend screening at an earlier age if you have risk factors for colon cancer. On a yearly basis, your caregiver may provide home test kits to check for hidden blood in the stool. Use of a small camera at the end of a tube, to directly examine the colon (sigmoidoscopy or colonoscopy), can detect the earliest forms of colorectal cancer. Talk to your caregiver about this at age 50, when routine screening begins. Direct examination of the colon should be repeated every 5 to 10 years through age 75, unless early forms of pre-cancerous polyps or small growths are found.  Hepatitis C blood testing is recommended for all people born from 1945 through 1965 and any individual with known risks for hepatitis C.  Healthy men should no longer receive prostate-specific antigen (PSA) blood tests as part of routine cancer screening. Consult with your caregiver about prostate cancer screening.  Testicular cancer screening is not recommended for adolescents or adult males who have no symptoms. Screening includes self-exam, caregiver exam, and other screening tests. Consult with your caregiver about any symptoms you have or any concerns you have about testicular cancer.  Practice safe sex. Use condoms and avoid high-risk sexual practices to reduce the spread of sexually transmitted infections (STIs).  Use sunscreen. Apply sunscreen liberally and repeatedly throughout the day. You should seek shade when your shadow is shorter than you. Protect yourself by wearing long sleeves, pants, a wide-brimmed hat, and sunglasses year round, whenever you are outdoors.  Notify your caregiver of new moles or changes in moles, especially if there is a change in shape or color. Also notify your caregiver if a mole is larger than the size of a pencil eraser.  A one-time screening for abdominal aortic aneurysm (AAA) and surgical repair of large AAAs by sound wave imaging (ultrasonography) is recommended for ages 65 to 75 years who are  current or former smokers.  Stay current with your immunizations. Document Released: 01/05/2008 Document Revised: 11/03/2012 Document Reviewed: 12/04/2010 [employer] Patient Information 2014 [employer].  

## 2013-07-03 LAB — PSA: PSA: 0.24 ng/mL (ref <=4.00)

## 2013-07-14 ENCOUNTER — Ambulatory Visit (INDEPENDENT_AMBULATORY_CARE_PROVIDER_SITE_OTHER): Payer: 59 | Admitting: Family Medicine

## 2013-07-14 VITALS — BP 148/82 | HR 67 | Temp 98.0°F | Resp 16 | Ht 68.5 in | Wt 252.0 lb

## 2013-07-14 DIAGNOSIS — R05 Cough: Secondary | ICD-10-CM

## 2013-07-14 DIAGNOSIS — H6590 Unspecified nonsuppurative otitis media, unspecified ear: Secondary | ICD-10-CM

## 2013-07-14 DIAGNOSIS — R509 Fever, unspecified: Secondary | ICD-10-CM

## 2013-07-14 DIAGNOSIS — R059 Cough, unspecified: Secondary | ICD-10-CM

## 2013-07-14 DIAGNOSIS — C9110 Chronic lymphocytic leukemia of B-cell type not having achieved remission: Secondary | ICD-10-CM

## 2013-07-14 LAB — POCT CBC
Granulocyte percent: 80.4 %G — AB (ref 37–80)
HCT, POC: 49.8 % (ref 43.5–53.7)
Hemoglobin: 15.7 g/dL (ref 14.1–18.1)
Lymph, poc: 0.9 (ref 0.6–3.4)
MCH, POC: 32 pg — AB (ref 27–31.2)
MCHC: 31.5 g/dL — AB (ref 31.8–35.4)
MCV: 101.4 fL — AB (ref 80–97)
MID (cbc): 0.8 (ref 0–0.9)
MPV: 9.8 fL (ref 0–99.8)
POC Granulocyte: 7 — AB (ref 2–6.9)
POC LYMPH PERCENT: 10.8 %L (ref 10–50)
POC MID %: 8.8 %M (ref 0–12)
Platelet Count, POC: 178 10*3/uL (ref 142–424)
RBC: 4.91 M/uL (ref 4.69–6.13)
RDW, POC: 13.7 %
WBC: 8.7 10*3/uL (ref 4.6–10.2)

## 2013-07-14 LAB — POCT INFLUENZA A/B
Influenza A, POC: NEGATIVE
Influenza B, POC: NEGATIVE

## 2013-07-14 MED ORDER — BENZONATATE 100 MG PO CAPS: 100.0000 mg | ORAL_CAPSULE | Freq: Three times a day (TID) | ORAL | Status: DC | PRN

## 2013-07-14 MED ORDER — AMOXICILLIN 875 MG PO TABS: 875.0000 mg | ORAL_TABLET | Freq: Two times a day (BID) | ORAL | Status: DC

## 2013-07-14 MED ORDER — HYDROCODONE-HOMATROPINE 5-1.5 MG/5ML PO SYRP: 5.0000 mL | ORAL_SOLUTION | ORAL | Status: DC | PRN

## 2013-07-14 NOTE — Patient Instructions (Signed)
Drink plenty of fluids and get enough rest  Take the amoxicillin one twice daily for infection  Take the cough pills one 3 times daily as needed for coughing. They will not sedate too much  Take the cough syrup at nighttime. It is sedating.

## 2013-07-14 NOTE — Progress Notes (Signed)
Subjective: Patient was here less than 2 weeks ago with a severe infection and has persisted with having some symptoms of the last couple of weeks. He had a cough which continues to persist. Last night he had a low-grade temperature of 99.8. He has some tickling in his throat and a cough. He has not having major body aches. 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 Generally he does well except for history of chronic lymphatic leukemia which went into remission a year ago.  Objective: Does not appear terribly ill. Has a little nasal congestion and cough. His right TM is mildly red. Left TM has a bleb still present at the base of the drum. His throat is minimally erythematous. Neck supple without significant nodes. Chest is clear to auscultation. Heart regular without murmurs.  Assessment: Resolving otitis Mild flulike illness Cough History of CLL   Plan: Flu swab Probably should still be on a round of antibiotics for the sinus symptoms and persistent incomplete resolution of his ear. Results for orders placed in visit on 07/14/13  POCT CBC      Result Value Range   WBC 8.7  4.6 - 10.2 K/uL   Lymph, poc 0.9  0.6 - 3.4   POC LYMPH PERCENT 10.8  10 - 50 %L   MID (cbc) 0.8  0 - 0.9   POC MID % 8.8  0 - 12 %M   POC Granulocyte 7.0 (*) 2 - 6.9   Granulocyte percent 80.4 (*) 37 - 80 %G   RBC 4.91  4.69 - 6.13 M/uL   Hemoglobin 15.7  14.1 - 18.1 g/dL   HCT, POC 60.6  30.1 - 53.7 %   MCV 101.4 (*) 80 - 97 fL   MCH, POC 32.0 (*) 27 - 31.2 pg   MCHC 31.5 (*) 31.8 - 35.4 g/dL   RDW, POC 60.1     Platelet Count, POC 178  142 - 424 K/uL   MPV 9.8  0 - 99.8 fL  POCT INFLUENZA A/B      Result Value Range   Influenza A, POC Negative     Influenza B, POC Negative     Will treat for the cough and symptomatically. Return if worse

## 2013-07-24 ENCOUNTER — Other Ambulatory Visit: Payer: Self-pay | Admitting: Family Medicine

## 2013-07-24 ENCOUNTER — Other Ambulatory Visit: Payer: Self-pay | Admitting: Hematology & Oncology

## 2013-08-12 ENCOUNTER — Encounter: Payer: Self-pay | Admitting: Family Medicine

## 2013-08-24 ENCOUNTER — Other Ambulatory Visit (HOSPITAL_BASED_OUTPATIENT_CLINIC_OR_DEPARTMENT_OTHER): Payer: 59 | Admitting: Lab

## 2013-08-24 ENCOUNTER — Encounter: Payer: Self-pay | Admitting: Hematology & Oncology

## 2013-08-24 ENCOUNTER — Ambulatory Visit (HOSPITAL_BASED_OUTPATIENT_CLINIC_OR_DEPARTMENT_OTHER): Payer: 59 | Admitting: Hematology & Oncology

## 2013-08-24 VITALS — BP 135/78 | HR 53 | Temp 97.6°F | Resp 18 | Ht 70.0 in | Wt 252.0 lb

## 2013-08-24 DIAGNOSIS — M1990 Unspecified osteoarthritis, unspecified site: Secondary | ICD-10-CM

## 2013-08-24 DIAGNOSIS — C9111 Chronic lymphocytic leukemia of B-cell type in remission: Secondary | ICD-10-CM

## 2013-08-24 DIAGNOSIS — Q998 Other specified chromosome abnormalities: Secondary | ICD-10-CM

## 2013-08-24 DIAGNOSIS — C9110 Chronic lymphocytic leukemia of B-cell type not having achieved remission: Secondary | ICD-10-CM

## 2013-08-24 LAB — CBC WITH DIFFERENTIAL (CANCER CENTER ONLY)
BASO#: 0 10*3/uL (ref 0.0–0.2)
BASO%: 0.6 % (ref 0.0–2.0)
EOS%: 4.1 % (ref 0.0–7.0)
Eosinophils Absolute: 0.3 10*3/uL (ref 0.0–0.5)
HCT: 44.2 % (ref 38.7–49.9)
HGB: 14.9 g/dL (ref 13.0–17.1)
LYMPH#: 1.3 10*3/uL (ref 0.9–3.3)
LYMPH%: 21.7 % (ref 14.0–48.0)
MCH: 31.6 pg (ref 28.0–33.4)
MCHC: 33.7 g/dL (ref 32.0–35.9)
MCV: 94 fL (ref 82–98)
MONO#: 0.6 10*3/uL (ref 0.1–0.9)
MONO%: 9.1 % (ref 0.0–13.0)
NEUT#: 4 10*3/uL (ref 1.5–6.5)
NEUT%: 64.5 % (ref 40.0–80.0)
Platelets: 169 10*3/uL (ref 145–400)
RBC: 4.71 10*6/uL (ref 4.20–5.70)
RDW: 12.6 % (ref 11.1–15.7)
WBC: 6.2 10*3/uL (ref 4.0–10.0)

## 2013-08-24 LAB — CHCC SATELLITE - SMEAR

## 2013-08-24 MED ORDER — MELOXICAM 15 MG PO TABS: 15.0000 mg | ORAL_TABLET | Freq: Every day | ORAL | Status: DC

## 2013-08-25 NOTE — Progress Notes (Signed)
This office note has been dictated.

## 2013-08-26 NOTE — Progress Notes (Signed)
CC:   Barton Fanny, M.D.  DIAGNOSIS:  Chronic lymphocytic leukemia - stage C - remission.  CURRENT THERAPY:  Observation.  INTERIM HISTORY:  [name] comes in for followup.  He is doing well. He is working without any difficulties.  He had a bit of a tough time in December.  He has never gotten the flu like everybody else.  Again, he feels well right now.  He has had no problems with lymph nodes being swollen.  He has had no fever.  He has had no rash.  He has had no problems with respect to shingles reactivation.  There has been no change in bowel or bladder habits.  PHYSICAL EXAMINATION:  This is a well-developed, well-nourished white gentleman in no obvious distress.  Vital Signs:  Temperature of 97.6, pulse 53, respiratory rate 18, blood pressure 135/78, and weight is 252 pounds.  Head and Neck:  Normocephalic and atraumatic skull.  There are no ocular or oral lesions.  There are no palpable cervical or supraclavicular lymph nodes.  Lungs:  Clear bilaterally.  Cardiac: Regular rate and rhythm with a normal S1 and S2.  There are no murmurs, rubs, or bruits.  Abdomen:  Soft.  He has good bowel sounds.  There is no fluid wave.  There is no palpable abdominal mass.  No palpable hepatosplenomegaly.  Axillary:  No bilateral axillary adenopathy. Extremities:  No clubbing, cyanosis, or edema.  Neurological:  No focal neurological deficits.  LABORATORY STUDIES:  White cell count is 6.2, hemoglobin 14.9, hematocrit 44.2, platelet count 169.  Blood smear, he has good maturation of his white blood cells.  I do not see any atypical lymphocytes.  There is no rouleaux formation.  He may have a rare smudge cell.  Platelets are adequate in number and size.  IMPRESSION:  [name] is a 58 year old gentleman.  He has chronic lymphocytic leukemia.  We treated him with Rituxan/bendamustine.  He completed treatments a year ago in February.  I still do not see any evidence of  recurrent disease.  Of note, he had a trisomy 12 chromosomal abnormality, which portends an intermediate prognosis.  We will plan to get him back in another 4 months.  I do not see that we need to do any blood work on him.    ______________________________ Volanda Napoleon, M.D. PRE/MEDQ  D:  08/25/2013  T:  08/26/2013  Job:  [id number]

## 2013-12-18 ENCOUNTER — Other Ambulatory Visit: Payer: Self-pay | Admitting: Nurse Practitioner

## 2013-12-18 DIAGNOSIS — C9110 Chronic lymphocytic leukemia of B-cell type not having achieved remission: Secondary | ICD-10-CM

## 2013-12-21 ENCOUNTER — Ambulatory Visit (HOSPITAL_BASED_OUTPATIENT_CLINIC_OR_DEPARTMENT_OTHER): Payer: 59 | Admitting: Hematology & Oncology

## 2013-12-21 ENCOUNTER — Other Ambulatory Visit (HOSPITAL_BASED_OUTPATIENT_CLINIC_OR_DEPARTMENT_OTHER): Payer: 59 | Admitting: Lab

## 2013-12-21 ENCOUNTER — Encounter: Payer: Self-pay | Admitting: Hematology & Oncology

## 2013-12-21 VITALS — BP 141/78 | HR 57 | Temp 97.6°F | Resp 18 | Ht 69.0 in | Wt 253.0 lb

## 2013-12-21 DIAGNOSIS — C9111 Chronic lymphocytic leukemia of B-cell type in remission: Secondary | ICD-10-CM

## 2013-12-21 DIAGNOSIS — C9110 Chronic lymphocytic leukemia of B-cell type not having achieved remission: Secondary | ICD-10-CM

## 2013-12-21 LAB — COMPREHENSIVE METABOLIC PANEL
ALT: 14 U/L (ref 0–53)
AST: 14 U/L (ref 0–37)
Albumin: 4.1 g/dL (ref 3.5–5.2)
Alkaline Phosphatase: 88 U/L (ref 39–117)
BUN: 14 mg/dL (ref 6–23)
CO2: 26 mEq/L (ref 19–32)
Calcium: 9.2 mg/dL (ref 8.4–10.5)
Chloride: 102 mEq/L (ref 96–112)
Creatinine, Ser: 1.14 mg/dL (ref 0.50–1.35)
Glucose, Bld: 167 mg/dL — ABNORMAL HIGH (ref 70–99)
Potassium: 4 mEq/L (ref 3.5–5.3)
Sodium: 138 mEq/L (ref 135–145)
Total Bilirubin: 0.5 mg/dL (ref 0.2–1.2)
Total Protein: 6.5 g/dL (ref 6.0–8.3)

## 2013-12-21 LAB — CBC WITH DIFFERENTIAL (CANCER CENTER ONLY)
BASO#: 0.1 10*3/uL (ref 0.0–0.2)
BASO%: 1 % (ref 0.0–2.0)
EOS%: 4.1 % (ref 0.0–7.0)
Eosinophils Absolute: 0.2 10*3/uL (ref 0.0–0.5)
HCT: 44.1 % (ref 38.7–49.9)
HGB: 15.6 g/dL (ref 13.0–17.1)
LYMPH#: 1.6 10*3/uL (ref 0.9–3.3)
LYMPH%: 27.7 % (ref 14.0–48.0)
MCH: 33.4 pg (ref 28.0–33.4)
MCHC: 35.4 g/dL (ref 32.0–35.9)
MCV: 94 fL (ref 82–98)
MONO#: 0.4 10*3/uL (ref 0.1–0.9)
MONO%: 7 % (ref 0.0–13.0)
NEUT#: 3.5 10*3/uL (ref 1.5–6.5)
NEUT%: 60.2 % (ref 40.0–80.0)
Platelets: 154 10*3/uL (ref 145–400)
RBC: 4.67 10*6/uL (ref 4.20–5.70)
RDW: 12.3 % (ref 11.1–15.7)
WBC: 5.8 10*3/uL (ref 4.0–10.0)

## 2013-12-21 NOTE — Progress Notes (Signed)
Hematology and Oncology Follow Up Visit  Charles [name] [id number] 1956-03-07 58 y.o. 12/21/2013   Principle Diagnosis:  Chronic lymphocytic leukemia (Trisomy 12) - stage C - remission.  Current Therapy:    Observation     Interim History:  Mr.  [name] is back for followup. He's doing quite well. Last saw back in February. Since that time his foot had no problems. He is still working. [place]ano issues with fevers sweats or chills. He's had a problem with cough. His energy bowel or bladder habits. He's had no rashes. There's been no problems weight loss or weight gain.  Medications: Current outpatient prescriptions:aspirin 81 MG tablet, Take 81 mg by mouth daily., Disp: , Rfl: ;  cetirizine (ZYRTEC) 10 MG tablet, Take 10 mg by mouth daily., Disp: , Rfl: ;  Cholecalciferol (VITAMIN D3) 2000 UNITS TABS, Take 2,000 Units by mouth., Disp: , Rfl: ;  CRESTOR 10 MG tablet, TAKE 1 TABLET BY MOUTH EVERY MORNING, Disp: 90 tablet, Rfl: 1 esomeprazole (NEXIUM) 40 MG capsule, Take 1 capsule (40 mg total) by mouth daily before breakfast., Disp: 90 capsule, Rfl: 3;  famciclovir (FAMVIR) 500 MG tablet, Take 500 mg by mouth every morning., Disp: , Rfl: ;  meloxicam (MOBIC) 15 MG tablet, Take 1 tablet (15 mg total) by mouth daily., Disp: 30 tablet, Rfl: 6  Allergies: No Known Allergies  Past Medical History, Surgical history, Social history, and Family History were reviewed and updated.  Review of Systems: As above  Physical Exam:  height is 5\' 9"  (1.753 m) and weight is 253 lb (114.76 kg). His oral temperature is 97.6 F (36.4 C). His blood pressure is 141/78 and his pulse is 57. His respiration is 18.   We will and well-nourished white 11. Head and neck exam shows no ocular or oral lesions. There is no adenopathy in the neck. Lungs are clear. Cardiac exam regular in rhythm with no murmurs rubs or bruits. Abdomen is soft. Has good bowel sounds. There is no fluid wave. There is a palpable liver edge. There  is a palpable splenomegaly. Back exam no tenderness over the spine ribs or hips. Extremities shows no clubbing cyanosis or edema. Neurological exam shows no focal neurological deficits. Skin exam no rashes ecchymoses or petechia. Axillary exam shows no bilateral axillary adenopathy.  Lab Results  Component Value Date   WBC 5.8 12/21/2013   HGB 15.6 12/21/2013   HCT 44.1 12/21/2013   MCV 94 12/21/2013   PLT 154 12/21/2013     Chemistry      Component Value Date/Time   NA 137 07/02/2013 1029   NA 137 04/10/2012 0815   K 4.1 07/02/2013 1029   K 4.1 04/10/2012 0815   CL 101 07/02/2013 1029   CL 102 04/10/2012 0815   CO2 28 07/02/2013 1029   CO2 27 04/10/2012 0815   BUN 13 07/02/2013 1029   BUN 16 04/10/2012 0815   CREATININE 1.04 07/02/2013 1029   CREATININE 1.10 01/21/2013 1422      Component Value Date/Time   CALCIUM 9.6 07/02/2013 1029   CALCIUM 9.3 04/10/2012 0815   ALKPHOS 99 07/02/2013 1029   ALKPHOS 97* 04/10/2012 0815   AST 21 07/02/2013 1029   AST 22 04/10/2012 0815   ALT 18 07/02/2013 1029   ALT 14 04/10/2012 0815   BILITOT 0.8 07/02/2013 1029   BILITOT 0.90 04/10/2012 0815         Impression and Plan: Charles [name] is 58 year old woman with CLL. He is  in remission. He had chemotherapy with 6 cycles of Rituxan/bendamustine. He tolerated this very well. The patient was back in February of 2014.  We'll go ahead and set him back in 6 months now. Pathology is blood smear. I do not see feel like it is show any evidence of recurrence of his CLL.   Volanda Napoleon, MD 6/1/201511:17 AM

## 2013-12-24 ENCOUNTER — Other Ambulatory Visit: Payer: Self-pay | Admitting: Family Medicine

## 2014-01-04 ENCOUNTER — Telehealth: Payer: Self-pay | Admitting: *Deleted

## 2014-01-04 ENCOUNTER — Ambulatory Visit (INDEPENDENT_AMBULATORY_CARE_PROVIDER_SITE_OTHER): Payer: 59 | Admitting: Family Medicine

## 2014-01-04 ENCOUNTER — Encounter: Payer: Self-pay | Admitting: Family Medicine

## 2014-01-04 VITALS — BP 122/72 | HR 54 | Temp 97.9°F | Resp 16 | Ht 68.5 in | Wt 250.2 lb

## 2014-01-04 DIAGNOSIS — M722 Plantar fascial fibromatosis: Secondary | ICD-10-CM

## 2014-01-04 MED ORDER — DICLOFENAC SODIUM 75 MG PO TBEC: 75.0000 mg | DELAYED_RELEASE_TABLET | Freq: Two times a day (BID) | ORAL | Status: DC

## 2014-01-04 MED ORDER — ESOMEPRAZOLE MAGNESIUM 40 MG PO CPDR: 40.0000 mg | DELAYED_RELEASE_CAPSULE | Freq: Every day | ORAL | Status: DC

## 2014-01-04 NOTE — Progress Notes (Signed)
° °  Subjective:    Patient ID: Charles [name], male    DOB: [birth year], 58 y.o.   MRN: [id number]  This chart was scribed for [name], MD by Maree Erie, ED Scribe.   Chief Complaint  Patient presents with   Medication Refill    PCP: Ellsworth Lennox, MD   HPI  Charles [name] is a 58 y.o. male who presents to office for a medication refill and left heel pain.  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 He denies any pain or issues with his heels until now. He is now having issues with severe pain to his left heel for about a month. The pain is intermittent and seems to come after he is sitting for prolonged periods and then stands up. He has been taking Tylenol for the pain with significant relief. He denies pain in his knees or hips when the heel pain comes on. He has been using a heel pad while he is walking at work. He works at [employer] second shift.   For his CLL, he states his numbers are back down to normal ranges. His WBC count is still slightly elevated but he is doing well. He was just seen by Oncology last week.   His son injured his ankle doing a jump for the army. He was discharged on 50% disability but may need to get an ankle replacement. He is looking for jobs now to find one that he doesn't have to be on his feet. He is looking into a job at [employer].   Review of Systems Review of Systems: Consitutional: No fever, chills, fatigue, night sweats, lymphadenopathy, or weight changes. Eyes: No visual changes, eye redness, or discharge. ENT/Mouth: Ears: No otalgia, tinnitus, hearing loss, discharge. Nose: No congestion, rhinorrhea, sinus pain, or epistaxis. Throat: No sore throat, post nasal drip, or teeth pain. Cardiovascular: No CP, palpitations, diaphoresis, DOE, edema, orthopnea, PND. Respiratory: No cough, hemoptysis, SOB, or wheezing. Gastrointestinal: No anorexia,  dysphagia, reflux, pain, nausea, vomiting, hematemesis, diarrhea, constipation, BRBPR, or melena. Genitourinary: No dysuria, frequency, urgency, hematuria, incontinence, nocturia, decreased urinary stream, discharge, impotence, or testicular pain/masses. Musculoskeletal: No decreased ROM, joint swelling, or weakness. Positive for heel pain, left. Skin: No rash, erythema, lesion changes, pain, warmth, jaundice, or pruritis. Neurological: No headache, dizziness, syncope, seizures, tremors, memory loss, coordination problems, or paresthesias. Psychological: No anxiety, depression, hallucinations, SI/HI. Endocrine: No fatigue, polydipsia, polyphagia or polyuria. All other systems were reviewed and are otherwise negative.       Objective:   Physical Exam General: Well-developed, well-nourished male in no acute distress; appearance consistent with age of record HENT: normocephalic; atraumatic Eyes: pupils equal, round and reactive to light; extraocular muscles intact Neck: supple Heart: regular rate and rhythm; no murmurs, rubs or gallops Lungs: clear to auscultation bilaterally Extremities: No deformity; full range of motion; pulses normal Neurologic: Awake, alert and oriented; motor function intact in all extremities and symmetric; no facial droop Skin: Warm and dry Psychiatric: Normal mood and affect      Assessment & Plan:    I personally performed the services described in this documentation, which was scribed in my presence. The recorded information has been reviewed and is accurate. GERD Plantar fasciitis of left foot - Plan: esomeprazole (NEXIUM) 40 MG capsule, diclofenac (VOLTAREN) 75 MG EC tablet  Signed, [name], MD    Signed, [name], MD

## 2014-01-04 NOTE — Telephone Encounter (Signed)
Called refills for NEXIUM 40 mg and VOLTAREN 75 mg with 3 RFs (three), per Dr Joseph Art. Order was given to [place].

## 2014-01-18 ENCOUNTER — Other Ambulatory Visit: Payer: Self-pay | Admitting: Family Medicine

## 2014-06-21 ENCOUNTER — Other Ambulatory Visit (HOSPITAL_BASED_OUTPATIENT_CLINIC_OR_DEPARTMENT_OTHER): Payer: 59 | Admitting: Lab

## 2014-06-21 ENCOUNTER — Encounter: Payer: Self-pay | Admitting: Hematology & Oncology

## 2014-06-21 ENCOUNTER — Ambulatory Visit (HOSPITAL_BASED_OUTPATIENT_CLINIC_OR_DEPARTMENT_OTHER): Payer: 59 | Admitting: Hematology & Oncology

## 2014-06-21 DIAGNOSIS — C9111 Chronic lymphocytic leukemia of B-cell type in remission: Secondary | ICD-10-CM

## 2014-06-21 DIAGNOSIS — C9110 Chronic lymphocytic leukemia of B-cell type not having achieved remission: Secondary | ICD-10-CM

## 2014-06-21 LAB — CBC WITH DIFFERENTIAL (CANCER CENTER ONLY)
BASO#: 0.1 10*3/uL (ref 0.0–0.2)
BASO%: 0.7 % (ref 0.0–2.0)
EOS%: 3.1 % (ref 0.0–7.0)
Eosinophils Absolute: 0.2 10*3/uL (ref 0.0–0.5)
HCT: 46.1 % (ref 38.7–49.9)
HGB: 15.8 g/dL (ref 13.0–17.1)
LYMPH#: 1.9 10*3/uL (ref 0.9–3.3)
LYMPH%: 26.5 % (ref 14.0–48.0)
MCH: 32.5 pg (ref 28.0–33.4)
MCHC: 34.3 g/dL (ref 32.0–35.9)
MCV: 95 fL (ref 82–98)
MONO#: 0.7 10*3/uL (ref 0.1–0.9)
MONO%: 9.4 % (ref 0.0–13.0)
NEUT#: 4.4 10*3/uL (ref 1.5–6.5)
NEUT%: 60.3 % (ref 40.0–80.0)
Platelets: 177 10*3/uL (ref 145–400)
RBC: 4.86 10*6/uL (ref 4.20–5.70)
RDW: 12.2 % (ref 11.1–15.7)
WBC: 7.2 10*3/uL (ref 4.0–10.0)

## 2014-06-21 LAB — CHCC SATELLITE - SMEAR

## 2014-06-21 NOTE — Progress Notes (Signed)
[facility]  Telephone:([phone] [phone]  ID: Charles [name] OB: 1956-06-22 MR#: [id number] FXT#:[id number] Patient Care Team: Barton Fanny, MD as PCP - General (Family Medicine)  DIAGNOSIS: Chronic lymphocytic leukemia (Trisomy 12) - stage C - remission  INTERVAL HISTORY: Mr.Charles [name] is here today for a follow-up. He's doing quite well and has had no issues since we saw him last. He is still working.  He denies fever, chills, n/v, cough, rash, headache, dizziness, SOB, chest pain, palpitations, abdominal pain, constipation, diarrhea, blood in urine or stool.  No swelling, tenderness, numbness or tingling in his extremities. No lymphadenopathy.  His appetite is good and he is drinking plenty of fluids. His weight is stable.   CURRENT TREATMENT: Observation  REVIEW OF SYSTEMS: All other 10 point review of systems is negative.   PAST MEDICAL HISTORY: Past Medical History  Diagnosis Date  . CLL (chronic lymphoblastic leukemia) 08/03/2011  . Shingles   . CLL (chronic lymphocytic leukemia) 03/18/2012  . Ulcer   . GERD (gastroesophageal reflux disease)     PAST SURGICAL HISTORY: Past Surgical History  Procedure Laterality Date  . Knee surgery    . Shoulder surgery    . Hernia repair    . Wrist surgery      FAMILY HISTORY Family History  Problem Relation Age of Onset  . Cancer Brother   . Hyperlipidemia Father   . Cancer Brother     GYNECOLOGIC HISTORY:  No LMP for male patient.   SOCIAL HISTORY: History   Social History  . Marital Status: Married    Spouse Name: N/A    Number of Children: N/A  . Years of Education: N/A   Occupational History  . Not on file.   Social History Main Topics  . Smoking status: Former Smoker -- 2.00 packs/day for 20 years    Types: Cigarettes    Start date: 08/24/1978    Quit date: 08/24/1998  . Smokeless tobacco: Never Used     Comment: quit smoking 15 years ago  . Alcohol Use: Yes      Comment: 1 a month  . Drug Use: No  . Sexual Activity:    Partners: Female    Museum/gallery curator: None   Other Topics Concern  . Not on file   Social History Narrative   Married. Education: [employer]. Exercise: Walk and bike.    ADVANCED DIRECTIVES:  <no information>  HEALTH MAINTENANCE: History  Substance Use Topics  . Smoking status: Former Smoker -- 2.00 packs/day for 20 years    Types: Cigarettes    Start date: 08/24/1978    Quit date: 08/24/1998  . Smokeless tobacco: Never Used     Comment: quit smoking 15 years ago  . Alcohol Use: Yes     Comment: 1 a month   Colonoscopy: PAP: Bone density: Lipid panel:  No Known Allergies  Current Outpatient Prescriptions  Medication Sig Dispense Refill  . aspirin 81 MG tablet Take 81 mg by mouth daily.    . cetirizine (ZYRTEC) 10 MG tablet Take 10 mg by mouth daily.    . Cholecalciferol (VITAMIN D3) 2000 UNITS TABS Take 2,000 Units by mouth.    . CRESTOR 10 MG tablet TAKE 1 TABLET BY MOUTH EVERY MORNING 90 tablet 2  . diclofenac (VOLTAREN) 75 MG EC tablet Take 1 tablet (75 mg total) by mouth 2 (two) times daily. 30 tablet 0  . esomeprazole (NEXIUM) 40 MG capsule Take 1 capsule (40 mg total)  by mouth daily. 90 capsule 3  . famciclovir (FAMVIR) 500 MG tablet Take 500 mg by mouth every morning.     No current facility-administered medications for this visit.    OBJECTIVE: Filed Vitals:   06/21/14 1005  BP: 144/79  Pulse: 54  Temp: 98.1 F (36.7 C)  Resp: 18    Filed Weights   06/21/14 1005  Weight: 258 lb (117.028 kg)   ECOG FS:0 - Asymptomatic Ocular: Sclerae unicteric, pupils equal, round and reactive to light Ear-nose-throat: Oropharynx clear, dentition fair Lymphatic: No cervical or supraclavicular adenopathy Lungs no rales or rhonchi, good excursion bilaterally Heart regular rate and rhythm, no murmur appreciated Abd soft, nontender, positive bowel sounds MSK no focal spinal tenderness, no joint  edema Neuro: non-focal, well-oriented, appropriate affect  LAB RESULTS: CMP     Component Value Date/Time   NA 138 12/21/2013 0900   NA 137 04/10/2012 0815   K 4.0 12/21/2013 0900   K 4.1 04/10/2012 0815   CL 102 12/21/2013 0900   CL 102 04/10/2012 0815   CO2 26 12/21/2013 0900   CO2 27 04/10/2012 0815   GLUCOSE 167* 12/21/2013 0900   GLUCOSE 112 04/10/2012 0815   BUN 14 12/21/2013 0900   BUN 16 04/10/2012 0815   CREATININE 1.14 12/21/2013 0900   CREATININE 1.04 07/02/2013 1029   CALCIUM 9.2 12/21/2013 0900   CALCIUM 9.3 04/10/2012 0815   PROT 6.5 12/21/2013 0900   PROT 6.7 04/10/2012 0815   ALBUMIN 4.1 12/21/2013 0900   AST 14 12/21/2013 0900   AST 22 04/10/2012 0815   ALT 14 12/21/2013 0900   ALT 14 04/10/2012 0815   ALKPHOS 88 12/21/2013 0900   ALKPHOS 97* 04/10/2012 0815   BILITOT 0.5 12/21/2013 0900   BILITOT 0.90 04/10/2012 0815   INo results found for: SPEP, UPEP Lab Results  Component Value Date   WBC 7.2 06/21/2014   NEUTROABS 4.4 06/21/2014   HGB 15.8 06/21/2014   HCT 46.1 06/21/2014   MCV 95 06/21/2014   PLT 177 06/21/2014   No results found for: LABCA2 No components found for: [id number] No results for input(s): INR in the last 168 hours.  STUDIES: No results found.  ASSESSMENT/PLAN: [name] is 58 year old woman with CLL. He is in remission. He had chemotherapy with 6 cycles of Rituxan/bendamustine. He tolerated this very well. He is feeling good and has had no issues since his last visit in June. He has had no s/s of recurrence.  His CBC today was normal.  We will see him back in 6 months for labs and follow-up.  He knows to call here with any questions or concerns and to go to the ED in the event of an emergency. We can certainly see him sooner if need be.   Eliezer Bottom, NP 06/21/2014 10:40 AM

## 2014-06-22 NOTE — Addendum Note (Signed)
Addended by: Volanda Napoleon on: 06/22/2014 07:33 AM   Modules accepted: Orders

## 2014-07-08 ENCOUNTER — Encounter: Payer: Self-pay | Admitting: Family Medicine

## 2014-07-08 ENCOUNTER — Ambulatory Visit (INDEPENDENT_AMBULATORY_CARE_PROVIDER_SITE_OTHER): Payer: 59 | Admitting: Family Medicine

## 2014-07-08 VITALS — BP 147/80 | HR 77 | Temp 97.9°F | Resp 16 | Ht 69.0 in | Wt 256.8 lb

## 2014-07-08 DIAGNOSIS — M722 Plantar fascial fibromatosis: Secondary | ICD-10-CM

## 2014-07-08 DIAGNOSIS — Z0000 Encounter for general adult medical examination without abnormal findings: Secondary | ICD-10-CM

## 2014-07-08 DIAGNOSIS — Z13220 Encounter for screening for lipoid disorders: Secondary | ICD-10-CM

## 2014-07-08 DIAGNOSIS — Z125 Encounter for screening for malignant neoplasm of prostate: Secondary | ICD-10-CM

## 2014-07-08 LAB — CBC WITH DIFFERENTIAL/PLATELET
Basophils Absolute: 0.1 10*3/uL (ref 0.0–0.1)
Basophils Relative: 1 % (ref 0–1)
Eosinophils Absolute: 0.3 10*3/uL (ref 0.0–0.7)
Eosinophils Relative: 4 % (ref 0–5)
HCT: 45.1 % (ref 39.0–52.0)
Hemoglobin: 15.4 g/dL (ref 13.0–17.0)
Lymphocytes Relative: 21 % (ref 12–46)
Lymphs Abs: 1.4 10*3/uL (ref 0.7–4.0)
MCH: 32.2 pg (ref 26.0–34.0)
MCHC: 34.1 g/dL (ref 30.0–36.0)
MCV: 94.4 fL (ref 78.0–100.0)
MPV: 11.6 fL (ref 9.4–12.4)
Monocytes Absolute: 0.5 10*3/uL (ref 0.1–1.0)
Monocytes Relative: 8 % (ref 3–12)
Neutro Abs: 4.4 10*3/uL (ref 1.7–7.7)
Neutrophils Relative %: 66 % (ref 43–77)
Platelets: 180 10*3/uL (ref 150–400)
RBC: 4.78 MIL/uL (ref 4.22–5.81)
RDW: 13.6 % (ref 11.5–15.5)
WBC: 6.7 10*3/uL (ref 4.0–10.5)

## 2014-07-08 LAB — PLATELET COUNT: Platelets: 180 10*3/uL (ref 150–400)

## 2014-07-08 LAB — IFOBT (OCCULT BLOOD): IFOBT: NEGATIVE

## 2014-07-08 MED ORDER — FAMCICLOVIR 500 MG PO TABS: 500.0000 mg | ORAL_TABLET | Freq: Every morning | ORAL | Status: DC

## 2014-07-08 MED ORDER — CRESTOR 10 MG PO TABS: 10.0000 mg | ORAL_TABLET | Freq: Every morning | ORAL | Status: DC

## 2014-07-08 MED ORDER — ESOMEPRAZOLE MAGNESIUM 40 MG PO CPDR: 40.0000 mg | DELAYED_RELEASE_CAPSULE | Freq: Every day | ORAL | Status: DC

## 2014-07-08 NOTE — Progress Notes (Signed)
   Subjective:    Patient ID: Charles [name], male    DOB: 08-Jun-1956, 58 y.o.   MRN: [id number]  HPI    Review of Systems  Constitutional: Negative.   HENT: Positive for hearing loss.   Eyes: Negative.   Respiratory: Negative.   Cardiovascular: Negative.   Gastrointestinal: Negative.   Endocrine: Negative.   Genitourinary: Negative.   Musculoskeletal: Positive for joint swelling.  Skin: Negative.   Allergic/Immunologic: Positive for environmental allergies.  Neurological: Negative.   Hematological: Negative.   Psychiatric/Behavioral: Negative.        Objective:   Physical Exam        Assessment & Plan:

## 2014-07-08 NOTE — Progress Notes (Signed)
   Subjective:    Patient ID: Charles [name], male    DOB: 12-14-1955, 58 y.o.   MRN: [id number] This chart was scribed for [name], MD by Marti Sleigh, Medical Scribe. This patient was seen in Room 27 and the patient's care was started a 2:35 PM.  Chief Complaint  Patient presents with  . Annual Exam    HPI HPI Comments: Charles [name] is a 58 y.o. male with a hx of leukemia and GERD, as well as a family hx of prostate cancer (two brothers) and breast cancer (sister) who presents to [facility] reporting for an annual exam. Pt states he is well. Pt states he has chronicly dry eyes. Pt states he is not exercising regularly. Pt has not had a coloscopy, and states he will schedule an appointment as soon as he can.   Pt has a past surgical hx of arthroscopic surgery (right knee), as well as a hernia repair.    Review of Systems  Constitutional: Negative for fever and chills.  Eyes:       Dry eyes  Respiratory: Negative for shortness of breath.   Cardiovascular: Negative for chest pain.  Gastrointestinal: Negative for abdominal pain.       Objective:   Physical Exam  Constitutional: He is oriented to person, place, and time. He appears well-developed and well-nourished.  HENT:  Head: Normocephalic and atraumatic.  Receeding gums.  Eyes: Pupils are equal, round, and reactive to light.  Neck: Neck supple.  Cardiovascular: Normal rate and regular rhythm.   Pulmonary/Chest: Effort normal and breath sounds normal. No respiratory distress.  Abdominal: Hernia confirmed negative in the right inguinal area and confirmed negative in the left inguinal area.  Genitourinary: Rectum normal and prostate normal.  Neurological: He is alert and oriented to person, place, and time.  Skin: Skin is warm and dry.  Psychiatric: He has a normal mood and affect. His behavior is normal.  Nursing note and vitals reviewed. rectal normal with normal prostate BP 160/09, measured by Dr.  Joseph Art. EKG:  NSR    Assessment & Plan:   This chart was scribed in my presence and reviewed by me personally.    ICD-9-CM ICD-10-CM   1. Annual physical exam V70.0 Z00.00 EKG 12-Lead     CBC with Differential     COMPLETE METABOLIC PANEL WITH GFR     Platelet count     IFOBT POC (occult bld, rslt in office)     Ambulatory referral to Gastroenterology  2. Screening for prostate cancer V76.44 Z12.5 PSA  3. Screening for hyperlipidemia V77.91 Z13.220 Lipid panel  4. Plantar fasciitis of left foot 728.71 M72.2 esomeprazole (NEXIUM) 40 MG capsule     Signed, [name], MD

## 2014-07-08 NOTE — Patient Instructions (Signed)

## 2014-07-09 LAB — COMPLETE METABOLIC PANEL WITH GFR
ALT: 24 U/L (ref 0–53)
AST: 23 U/L (ref 0–37)
Albumin: 4.1 g/dL (ref 3.5–5.2)
Alkaline Phosphatase: 79 U/L (ref 39–117)
BUN: 17 mg/dL (ref 6–23)
CO2: 24 mEq/L (ref 19–32)
Calcium: 9.5 mg/dL (ref 8.4–10.5)
Chloride: 105 mEq/L (ref 96–112)
Creat: 1.04 mg/dL (ref 0.50–1.35)
GFR, Est African American: >89 mL/min
GFR, Est Non African American: 78 mL/min
Glucose, Bld: 93 mg/dL (ref 70–99)
Potassium: 4.3 mEq/L (ref 3.5–5.3)
Sodium: 140 mEq/L (ref 135–145)
Total Bilirubin: 0.5 mg/dL (ref 0.2–1.2)
Total Protein: 6.7 g/dL (ref 6.0–8.3)

## 2014-07-09 LAB — LIPID PANEL
Cholesterol: 150 mg/dL (ref 0–200)
HDL: 29 mg/dL — ABNORMAL LOW (ref >39)
LDL Cholesterol: 86 mg/dL (ref 0–99)
Total CHOL/HDL Ratio: 5.2 Ratio
Triglycerides: 177 mg/dL — ABNORMAL HIGH (ref <150)
VLDL: 35 mg/dL (ref 0–40)

## 2014-07-09 LAB — PSA: PSA: 0.34 ng/mL (ref <=4.00)

## 2014-08-05 ENCOUNTER — Other Ambulatory Visit: Payer: Self-pay | Admitting: Family Medicine

## 2014-08-05 NOTE — Telephone Encounter (Signed)
Dr L, you just saw pt for CPE and discussed plantar fasc, but I don't see this med on drug list. Did you want to give RF?

## 2014-08-09 ENCOUNTER — Encounter: Payer: Self-pay | Admitting: Internal Medicine

## 2014-08-25 ENCOUNTER — Other Ambulatory Visit: Payer: Self-pay | Admitting: Family

## 2014-09-20 ENCOUNTER — Ambulatory Visit: Payer: 59 | Admitting: Hematology & Oncology

## 2014-09-20 ENCOUNTER — Other Ambulatory Visit: Payer: 59 | Admitting: Lab

## 2014-09-30 ENCOUNTER — Ambulatory Visit (INDEPENDENT_AMBULATORY_CARE_PROVIDER_SITE_OTHER): Payer: 59

## 2014-09-30 ENCOUNTER — Ambulatory Visit (INDEPENDENT_AMBULATORY_CARE_PROVIDER_SITE_OTHER): Payer: 59 | Admitting: Podiatry

## 2014-09-30 ENCOUNTER — Encounter: Payer: Self-pay | Admitting: Podiatry

## 2014-09-30 VITALS — BP 175/97 | HR 68 | Resp 16

## 2014-09-30 DIAGNOSIS — M722 Plantar fascial fibromatosis: Secondary | ICD-10-CM | POA: Diagnosis not present

## 2014-09-30 MED ORDER — METHYLPREDNISOLONE (PAK) 4 MG PO TABS: ORAL_TABLET | ORAL | Status: DC

## 2014-09-30 MED ORDER — MELOXICAM 15 MG PO TABS: 15.0000 mg | ORAL_TABLET | Freq: Every day | ORAL | Status: DC

## 2014-09-30 NOTE — Progress Notes (Signed)
   Subjective:    Patient ID: Charles [name], male    DOB: 01-08-1956, 59 y.o.   MRN: [id number]  HPI Comments: "I have heel pain"  Patient c/o aching plantar heel left for about 3-4 months. He has AM pain. Tried advil and tylenol which makes tolerable.  Foot Pain      Review of Systems  All other systems reviewed and are negative.      Objective:   Physical Exam: I have reviewed his past medical history medications allergy surgery social history and review of systems. Pulses are strongly palpable bilateral. Neurologic sensorium is intact percent was C monofilament. Deep tendon reflexes are intact bilateral muscle strength +5 over 5 dorsiflexors and plantar flexors and everters and inverters. Orthopedic evaluation was resolved with symptoms of ankle range of motion without crepitus sharp pain on palpation medial tubercle of the left heel. No pain on medial and lateral compression of the calcaneus. Radiographic evaluation demonstrates a soft tissue increase in density at the plantar fascial calcaneal insertion site indicative of plantar fasciitis.        Assessment & Plan:  Assessment: Plantar fasciitis left foot.  Plan: Discussed etiology pathology conservative versus surgical therapies. We discussed appropriate shoe gear stretching exercises ice therapy and shoe modifications. We injected the left heel today with Kenalog and local anesthetic. Started a Medrol Dosepak to be followed by meloxicam. Also placed him in a plantar fascial strapping and a night splint. I will follow up with him in 1 month.

## 2014-09-30 NOTE — Patient Instructions (Signed)

## 2014-10-28 ENCOUNTER — Encounter: Payer: Self-pay | Admitting: Podiatry

## 2014-10-28 ENCOUNTER — Ambulatory Visit (INDEPENDENT_AMBULATORY_CARE_PROVIDER_SITE_OTHER): Payer: 59 | Admitting: Podiatry

## 2014-10-28 VITALS — BP 160/91 | HR 53 | Resp 15

## 2014-10-28 DIAGNOSIS — M722 Plantar fascial fibromatosis: Secondary | ICD-10-CM

## 2014-10-28 NOTE — Progress Notes (Signed)
He presents today for follow-up of plantar fasciitis to his left foot. He states that he is doing quite well at approximately 75% improvement.  Objective: Vital signs are stable he is alert and oriented 3. Minimal pain on palpation of the left heel.  Assessment: Well-healing plantar fasciitis 75% resolved.  Plan: Reinjected his left heel today continue all conservative therapies follow up with him in 1 month. We did discuss the need for orthotics.

## 2014-11-25 ENCOUNTER — Encounter: Payer: Self-pay | Admitting: Podiatry

## 2014-11-25 ENCOUNTER — Ambulatory Visit (INDEPENDENT_AMBULATORY_CARE_PROVIDER_SITE_OTHER): Payer: 59 | Admitting: Podiatry

## 2014-11-25 VITALS — BP 178/86 | HR 59 | Resp 16

## 2014-11-25 DIAGNOSIS — M722 Plantar fascial fibromatosis: Secondary | ICD-10-CM | POA: Diagnosis not present

## 2014-11-25 NOTE — Progress Notes (Signed)
He presents today for follow-up of plantar fasciitis to his left foot. He states his a whole lot better he states that it only bothers him after he is been on his feet for a period of time.  Objective: Vital signs are stable he is alert and oriented 3 minimal pain on palpation medial calcaneal tubercle of the left heel.  Assessment: Well-healed plantar fasciitis.  Plan: I encouraged him to the scan for several orthotics to help control the biomechanical abnormalities of this foot resulted in plantar fasciitis of the first place. I will follow up with him once has come in. He will continue all conservative therapies.

## 2014-12-16 ENCOUNTER — Ambulatory Visit (INDEPENDENT_AMBULATORY_CARE_PROVIDER_SITE_OTHER): Payer: 59 | Admitting: Podiatry

## 2014-12-16 ENCOUNTER — Encounter: Payer: Self-pay | Admitting: Podiatry

## 2014-12-16 VITALS — BP 145/81 | HR 54 | Resp 16

## 2014-12-16 DIAGNOSIS — M722 Plantar fascial fibromatosis: Secondary | ICD-10-CM

## 2014-12-16 NOTE — Progress Notes (Signed)
He presents today follow-up plantar fasciitis left foot. He states that he is nearly 100% better he states that he has very little pain on a regular basis. It also like to pick up his orthotics today.  Objective: Vital signs are stable he is alert and oriented 3. Pulses are strongly palpable bilateral. He has no pain on palpation medial calcaneal tubercle of the left heel.  Assessment: Plantar fasciitis left foot resolving.  Plan: He was dispensed orthotics today was given both oral and written home-going instructions regarding the care and use of the orthotics. I will follow-up with him in 1 month if necessary. He will continue all other conservative therapies or plantar fasciitis.

## 2014-12-16 NOTE — Patient Instructions (Signed)

## 2014-12-22 ENCOUNTER — Other Ambulatory Visit (HOSPITAL_BASED_OUTPATIENT_CLINIC_OR_DEPARTMENT_OTHER): Payer: 59

## 2014-12-22 ENCOUNTER — Ambulatory Visit (HOSPITAL_BASED_OUTPATIENT_CLINIC_OR_DEPARTMENT_OTHER): Payer: 59 | Admitting: Hematology & Oncology

## 2014-12-22 ENCOUNTER — Encounter: Payer: Self-pay | Admitting: Hematology & Oncology

## 2014-12-22 VITALS — BP 149/83 | HR 53 | Temp 98.0°F | Resp 18 | Ht 69.0 in | Wt 260.0 lb

## 2014-12-22 DIAGNOSIS — C9110 Chronic lymphocytic leukemia of B-cell type not having achieved remission: Secondary | ICD-10-CM

## 2014-12-22 DIAGNOSIS — C9111 Chronic lymphocytic leukemia of B-cell type in remission: Secondary | ICD-10-CM

## 2014-12-22 LAB — CBC WITH DIFFERENTIAL (CANCER CENTER ONLY)
BASO#: 0.1 10*3/uL (ref 0.0–0.2)
BASO%: 0.8 % (ref 0.0–2.0)
EOS%: 2.7 % (ref 0.0–7.0)
Eosinophils Absolute: 0.2 10*3/uL (ref 0.0–0.5)
HCT: 42.6 % (ref 38.7–49.9)
HGB: 15.1 g/dL (ref 13.0–17.1)
LYMPH#: 2.3 10*3/uL (ref 0.9–3.3)
LYMPH%: 31.7 % (ref 14.0–48.0)
MCH: 33.6 pg — ABNORMAL HIGH (ref 28.0–33.4)
MCHC: 35.4 g/dL (ref 32.0–35.9)
MCV: 95 fL (ref 82–98)
MONO#: 0.7 10*3/uL (ref 0.1–0.9)
MONO%: 9.1 % (ref 0.0–13.0)
NEUT#: 4.1 10*3/uL (ref 1.5–6.5)
NEUT%: 55.7 % (ref 40.0–80.0)
Platelets: 156 10*3/uL (ref 145–400)
RBC: 4.49 10*6/uL (ref 4.20–5.70)
RDW: 12.1 % (ref 11.1–15.7)
WBC: 7.3 10*3/uL (ref 4.0–10.0)

## 2014-12-22 LAB — COMPREHENSIVE METABOLIC PANEL
ALT: 16 U/L (ref 0–53)
AST: 19 U/L (ref 0–37)
Albumin: 4 g/dL (ref 3.5–5.2)
Alkaline Phosphatase: 77 U/L (ref 39–117)
BUN: 14 mg/dL (ref 6–23)
CO2: 23 mEq/L (ref 19–32)
Calcium: 9.1 mg/dL (ref 8.4–10.5)
Chloride: 102 mEq/L (ref 96–112)
Creatinine, Ser: 1.02 mg/dL (ref 0.50–1.35)
Glucose, Bld: 115 mg/dL — ABNORMAL HIGH (ref 70–99)
Potassium: 4.3 mEq/L (ref 3.5–5.3)
Sodium: 139 mEq/L (ref 135–145)
Total Bilirubin: 0.6 mg/dL (ref 0.2–1.2)
Total Protein: 6.4 g/dL (ref 6.0–8.3)

## 2014-12-22 LAB — LACTATE DEHYDROGENASE: LDH: 157 U/L (ref 94–250)

## 2014-12-22 LAB — CHCC SATELLITE - SMEAR

## 2014-12-22 NOTE — Progress Notes (Signed)
Hematology and Oncology Follow Up Visit  Charles [name] [id number] [birth year] 59 y.o. 12/22/2014   Principle Diagnosis:  Chronic lymphocytic leukemia (Trisomy 12) - stage C - remission.  Current Therapy:    Observation     Interim History:  Charles [name] is back for followup. He's doing quite well. I run into him at the hospital every now and then. 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  He has been doing okay. His wife has not been doing too well. She has had pneumonia.  He has had no problems with cough. He's had no sweats. He's had no rashes. He's had no change in bowel or bladder habits. He has had no leg swelling.  There's been no cough or shortness of breath. He's had no palpitations. He's had no bony issues.  Overall, his performance status is ECOG 0.  Medications:  Current outpatient prescriptions:  .  aspirin 81 MG tablet, Take 81 mg by mouth daily., Disp: , Rfl:  .  cetirizine (ZYRTEC) 10 MG tablet, Take 10 mg by mouth daily., Disp: , Rfl:  .  Cholecalciferol (VITAMIN D3) 2000 UNITS TABS, Take 2,000 Units by mouth., Disp: , Rfl:  .  CRESTOR 10 MG tablet, Take 1 tablet (10 mg total) by mouth every morning., Disp: 90 tablet, Rfl: 3 .  esomeprazole (NEXIUM) 40 MG capsule, Take 1 capsule (40 mg total) by mouth daily., Disp: 90 capsule, Rfl: 3 .  famciclovir (FAMVIR) 500 MG tablet, Take 1 tablet (500 mg total) by mouth every morning., Disp: 90 tablet, Rfl: 3 .  meloxicam (MOBIC) 15 MG tablet, Take 1 tablet (15 mg total) by mouth daily., Disp: 30 tablet, Rfl: 3  Allergies: No Known Allergies  Past Medical History, Surgical history, Social history, and Family History were reviewed and updated.  Review of Systems: As above  Physical Exam:  height is 5\' 9"  (1.753 m) and weight is 260 lb (117.935 kg). His oral temperature is 98 F (36.7 C). His blood pressure is 149/83 and his pulse is 53. His respiration is 18.   We will and well-nourished  white 11. Head and neck exam shows no ocular or oral lesions. There is no adenopathy in the neck. Lungs are clear. Cardiac exam regular rate and rhythm with no murmurs, rubs or bruits. Abdomen is soft. He has good bowel sounds. There is no fluid wave. There is a palpable liver edge. There is a palpable splenomegaly. Back exam no tenderness over the spine ribs or hips. Extremities shows no clubbing cyanosis or edema. Neurological exam shows no focal neurological deficits. Skin exam no rashes ecchymoses or petechia. Axillary exam shows no bilateral axillary adenopathy.  Lab Results  Component Value Date   WBC 7.3 12/22/2014   HGB 15.1 12/22/2014   HCT 42.6 12/22/2014   MCV 95 12/22/2014   PLT 156 12/22/2014     Chemistry      Component Value Date/Time   NA 140 07/08/2014 0830   NA 137 04/10/2012 0815   K 4.3 07/08/2014 0830   K 4.1 04/10/2012 0815   CL 105 07/08/2014 0830   CL 102 04/10/2012 0815   CO2 24 07/08/2014 0830   CO2 27 04/10/2012 0815   BUN 17 07/08/2014 0830   BUN 16 04/10/2012 0815   CREATININE 1.04 07/08/2014 0830   CREATININE 1.14 12/21/2013 0900      Component Value Date/Time   CALCIUM 9.5 07/08/2014 0830   CALCIUM 9.3  04/10/2012 0815   ALKPHOS 79 07/08/2014 0830   ALKPHOS 97* 04/10/2012 0815   AST 23 07/08/2014 0830   AST 22 04/10/2012 0815   ALT 24 07/08/2014 0830   ALT 14 04/10/2012 0815   BILITOT 0.5 07/08/2014 0830   BILITOT 0.90 04/10/2012 0815         Impression and Plan: Charles [name] is 59 year old woman with CLL. He is in remission. He had chemotherapy with 6 cycles of Rituxan/bendamustine. He tolerated this very well. The patient was back in February of 2014.  We'll go ahead and set him back in 6 months now  . I did look at his blood smear. Everything looked normal. The lymphocytes had normal morphology. There were no schistocytes. Platelets appeared well granulated.   Charles Napoleon, MD 6/1/201610:20 AM

## 2015-01-13 ENCOUNTER — Ambulatory Visit (INDEPENDENT_AMBULATORY_CARE_PROVIDER_SITE_OTHER): Payer: 59 | Admitting: Podiatry

## 2015-01-13 ENCOUNTER — Encounter: Payer: Self-pay | Admitting: Podiatry

## 2015-01-13 VITALS — BP 150/77 | HR 57 | Resp 16

## 2015-01-13 DIAGNOSIS — M722 Plantar fascial fibromatosis: Secondary | ICD-10-CM

## 2015-01-13 NOTE — Progress Notes (Signed)
He presents today for follow-up of his plantar fasciitis left foot. States he has been wearing his orthotics regularly and he is approximately 90% improved.  Objective: Vital signs are stable he is alert and oriented 3 pulses are strongly palpable bilateral. Neurologic sensorium is intact percent C monofilament. No pain on palpation medial calcaneal tubercle of the left heel.  Assessment: Pain and limb secondary to plantar fasciitis resolving.  Plan: Follow up with me as needed.

## 2015-03-23 ENCOUNTER — Other Ambulatory Visit: Payer: Self-pay | Admitting: Podiatry

## 2015-03-23 NOTE — Telephone Encounter (Signed)
Pt needs an appt prior to future refills. 

## 2015-04-21 ENCOUNTER — Telehealth: Payer: Self-pay

## 2015-04-21 NOTE — Telephone Encounter (Signed)
Pharm faxed req to change pt's Crestor to generic. I didn 't see anything in chart about whether pt has hx of trying the generic. Called pt and he stated that he has never tried the generic and is willing to. Notified pharm OK for generic.

## 2015-05-20 ENCOUNTER — Other Ambulatory Visit: Payer: Self-pay | Admitting: Family Medicine

## 2015-06-24 ENCOUNTER — Other Ambulatory Visit (HOSPITAL_BASED_OUTPATIENT_CLINIC_OR_DEPARTMENT_OTHER): Payer: 59

## 2015-06-24 ENCOUNTER — Ambulatory Visit (HOSPITAL_BASED_OUTPATIENT_CLINIC_OR_DEPARTMENT_OTHER): Payer: 59 | Admitting: Hematology & Oncology

## 2015-06-24 VITALS — BP 163/84 | HR 53 | Temp 97.9°F | Resp 18 | Ht 69.0 in | Wt 259.4 lb

## 2015-06-24 DIAGNOSIS — C9110 Chronic lymphocytic leukemia of B-cell type not having achieved remission: Secondary | ICD-10-CM

## 2015-06-24 DIAGNOSIS — C9111 Chronic lymphocytic leukemia of B-cell type in remission: Secondary | ICD-10-CM

## 2015-06-24 DIAGNOSIS — K219 Gastro-esophageal reflux disease without esophagitis: Secondary | ICD-10-CM

## 2015-06-24 LAB — COMPREHENSIVE METABOLIC PANEL
ALT: 16 U/L (ref 0–55)
AST: 20 U/L (ref 5–34)
Albumin: 4 g/dL (ref 3.5–5.0)
Alkaline Phosphatase: 81 U/L (ref 40–150)
Anion Gap: 11 mEq/L (ref 3–11)
BUN: 9.8 mg/dL (ref 7.0–26.0)
CO2: 22 mEq/L (ref 22–29)
Calcium: 9.5 mg/dL (ref 8.4–10.4)
Chloride: 106 mEq/L (ref 98–109)
Creatinine: 1 mg/dL (ref 0.7–1.3)
EGFR: 78 mL/min/{1.73_m2} — ABNORMAL LOW (ref >90)
Glucose: 141 mg/dl — ABNORMAL HIGH (ref 70–140)
Potassium: 4 mEq/L (ref 3.5–5.1)
Sodium: 139 mEq/L (ref 136–145)
Total Bilirubin: 0.76 mg/dL (ref 0.20–1.20)
Total Protein: 6.9 g/dL (ref 6.4–8.3)

## 2015-06-24 LAB — CBC WITH DIFFERENTIAL (CANCER CENTER ONLY)
BASO#: 0.1 10*3/uL (ref 0.0–0.2)
BASO%: 0.9 % (ref 0.0–2.0)
EOS%: 3.3 % (ref 0.0–7.0)
Eosinophils Absolute: 0.2 10*3/uL (ref 0.0–0.5)
HCT: 44.9 % (ref 38.7–49.9)
HGB: 15.7 g/dL (ref 13.0–17.1)
LYMPH#: 1.9 10*3/uL (ref 0.9–3.3)
LYMPH%: 27.6 % (ref 14.0–48.0)
MCH: 32.7 pg (ref 28.0–33.4)
MCHC: 35 g/dL (ref 32.0–35.9)
MCV: 94 fL (ref 82–98)
MONO#: 0.5 10*3/uL (ref 0.1–0.9)
MONO%: 7.3 % (ref 0.0–13.0)
NEUT#: 4.2 10*3/uL (ref 1.5–6.5)
NEUT%: 60.9 % (ref 40.0–80.0)
Platelets: 170 10*3/uL (ref 145–400)
RBC: 4.8 10*6/uL (ref 4.20–5.70)
RDW: 12.1 % (ref 11.1–15.7)
WBC: 6.9 10*3/uL (ref 4.0–10.0)

## 2015-06-24 LAB — LACTATE DEHYDROGENASE: LDH: 175 U/L (ref 125–245)

## 2015-06-24 LAB — CHCC SATELLITE - SMEAR

## 2015-06-24 NOTE — Progress Notes (Signed)
Hematology and Oncology Follow Up Visit  HERBER RICARDEZ [id number] 1955-12-02 59 y.o. 06/24/2015   Principle Diagnosis:  Chronic lymphocytic leukemia (Trisomy 12) - stage C - remission.  Current Therapy:    Observation     Interim History:  Mr.  Mcquarrie is back for followup. He's doing quite well. I run into him at the hospital every now and then. 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  His wife is out of time since I last saw him. She had she had a mini stroke. She ultimately needed to have a new heart valve placed. She is on Coumadin. She had open heart surgery. She is recovering from this.  He has been feeling quite well. He isn't working all the time. He's had no problems with fever. He's had no infections. He's had no lymph nodes. He's had no change in bowel or bladder habits. He's had no cough.  Overall, his performance status is ECOG 0.  Medications:  Current outpatient prescriptions:  .  aspirin 81 MG tablet, Take 81 mg by mouth daily., Disp: , Rfl:  .  cetirizine (ZYRTEC) 10 MG tablet, Take 10 mg by mouth daily., Disp: , Rfl:  .  Cholecalciferol (VITAMIN D3) 2000 UNITS TABS, Take 2,000 Units by mouth., Disp: , Rfl:  .  CRESTOR 10 MG tablet, TAKE 1 TABLET BY MOUTH EVERY MORNING., Disp: 90 tablet, Rfl: 0 .  esomeprazole (NEXIUM) 40 MG capsule, Take 1 capsule (40 mg total) by mouth daily., Disp: 90 capsule, Rfl: 3 .  famciclovir (FAMVIR) 500 MG tablet, Take 1 tablet (500 mg total) by mouth every morning., Disp: 90 tablet, Rfl: 3 .  meloxicam (MOBIC) 15 MG tablet, TAKE 1 TABLET BY MOUTH ONCE DAILY, Disp: 30 tablet, Rfl: 7  Allergies: No Known Allergies  Past Medical History, Surgical history, Social history, and Family History were reviewed and updated.  Review of Systems: As above  Physical Exam:  height is 5\' 9"  (1.753 m) and weight is 259 lb 6.4 oz (117.663 kg). His oral temperature is 97.9 F (36.6 C). His blood pressure is 163/84 and  his pulse is 53. His respiration is 18.   Well-developed and well-nourished white male. Head and neck exam shows no ocular or oral lesions. There is no adenopathy in the neck. Lungs are clear. Cardiac exam regular rate and rhythm with no murmurs, rubs or bruits. Abdomen is soft. He has good bowel sounds. There is no fluid wave. There is a palpable liver edge. There is a palpable splenomegaly. Back exam no tenderness over the spine ribs or hips. Extremities shows no clubbing cyanosis or edema. Neurological exam shows no focal neurological deficits. Skin exam no rashes ecchymoses or petechia. Axillary exam shows no bilateral axillary adenopathy.  Lab Results  Component Value Date   WBC 6.9 06/24/2015   HGB 15.7 06/24/2015   HCT 44.9 06/24/2015   MCV 94 06/24/2015   PLT 170 06/24/2015     Chemistry      Component Value Date/Time   NA 139 12/22/2014 0925   NA 137 04/10/2012 0815   K 4.3 12/22/2014 0925   K 4.1 04/10/2012 0815   CL 102 12/22/2014 0925   CL 102 04/10/2012 0815   CO2 23 12/22/2014 0925   CO2 27 04/10/2012 0815   BUN 14 12/22/2014 0925   BUN 16 04/10/2012 0815   CREATININE 1.02 12/22/2014 0925   CREATININE 1.04 07/08/2014 0830  Component Value Date/Time   CALCIUM 9.1 12/22/2014 0925   CALCIUM 9.3 04/10/2012 0815   ALKPHOS 77 12/22/2014 0925   ALKPHOS 97* 04/10/2012 0815   AST 19 12/22/2014 0925   AST 22 04/10/2012 0815   ALT 16 12/22/2014 0925   ALT 14 04/10/2012 0815   BILITOT 0.6 12/22/2014 0925   BILITOT 0.90 04/10/2012 0815         Impression and Plan: Mr. Veasley is 59 year old male with CLL. He had good chromosomes. He had a trisomy 12 chromosome abnormality.  He finishes chemotherapy with Rituxan/Treanda back in February 2014. He had 6 cycles.  I do not see any evidence of recurrent disease.  We will go ahead and plan for another six-month follow-up. I think all is good we see him back in 6 months, and we'll probably move his appointments to  every 8 months.    Volanda Napoleon, MD 12/2/201611:08 AM

## 2015-06-28 LAB — IGG, IGA, IGM
IgA: 131 mg/dL (ref 68–379)
IgG (Immunoglobin G), Serum: 745 mg/dL (ref 650–1600)
IgM, Serum: 57 mg/dL (ref 41–251)

## 2015-07-11 ENCOUNTER — Encounter: Payer: 59 | Admitting: Family Medicine

## 2015-07-13 ENCOUNTER — Encounter: Payer: Self-pay | Admitting: Family Medicine

## 2015-07-13 ENCOUNTER — Ambulatory Visit (INDEPENDENT_AMBULATORY_CARE_PROVIDER_SITE_OTHER): Payer: 59 | Admitting: Family Medicine

## 2015-07-13 VITALS — BP 140/92 | HR 54 | Temp 97.7°F | Resp 16 | Ht 69.0 in | Wt 256.2 lb

## 2015-07-13 DIAGNOSIS — Z720 Tobacco use: Secondary | ICD-10-CM | POA: Diagnosis not present

## 2015-07-13 DIAGNOSIS — Z125 Encounter for screening for malignant neoplasm of prostate: Secondary | ICD-10-CM | POA: Diagnosis not present

## 2015-07-13 DIAGNOSIS — Z0000 Encounter for general adult medical examination without abnormal findings: Secondary | ICD-10-CM

## 2015-07-13 DIAGNOSIS — E785 Hyperlipidemia, unspecified: Secondary | ICD-10-CM

## 2015-07-13 DIAGNOSIS — Z87891 Personal history of nicotine dependence: Secondary | ICD-10-CM

## 2015-07-13 DIAGNOSIS — Z119 Encounter for screening for infectious and parasitic diseases, unspecified: Secondary | ICD-10-CM

## 2015-07-13 DIAGNOSIS — R001 Bradycardia, unspecified: Secondary | ICD-10-CM | POA: Diagnosis not present

## 2015-07-13 DIAGNOSIS — Z131 Encounter for screening for diabetes mellitus: Secondary | ICD-10-CM | POA: Diagnosis not present

## 2015-07-13 LAB — LIPID PANEL
Cholesterol: 125 mg/dL (ref 125–200)
HDL: 26 mg/dL — ABNORMAL LOW (ref >=40)
LDL Cholesterol: 61 mg/dL (ref <130)
Total CHOL/HDL Ratio: 4.8 Ratio (ref <=5.0)
Triglycerides: 188 mg/dL — ABNORMAL HIGH (ref <150)
VLDL: 38 mg/dL — ABNORMAL HIGH (ref <30)

## 2015-07-13 LAB — HIV ANTIBODY (ROUTINE TESTING W REFLEX): HIV 1&2 Ab, 4th Generation: NONREACTIVE

## 2015-07-13 LAB — HEMOGLOBIN A1C
Hgb A1c MFr Bld: 5.8 % — ABNORMAL HIGH (ref <5.7)
Mean Plasma Glucose: 120 mg/dL — ABNORMAL HIGH (ref <117)

## 2015-07-13 NOTE — Patient Instructions (Signed)
It was great to see you today!  I will be in touch with your labs and we will get your cologuard testing for colon cancer screening   Please do schedule to see Dr. Derrel Nip soon to look at the spot on your nose soon [facility] [place] [phone]  Your heart rate is a bit slow but this is normal for you. However, if you do start to have any symptoms (weakness, dizziness, less energy, fainting) please seek care  Recently, a new recommendation has been made regarding screening for lung cancer using annual "low dose" CT scanning.  This service is recommended for people who are 68- 50 years old, who currently smoke or quit in the last 15 years, and who smoked at least a pack per day for 30 years or more.   Some patients who are at least 60 years old and who smoked a pack per day for 20 years, or were exposed to second hand smoke may also qualify for screening.   In [place] this service is available at [facility], [phone]. The exam costs about $300 but may be covered by insurance.    When you turn 65 you will want to consider a one time abdominal ultrasound to screen for an abdominal aortic aneurysm.

## 2015-07-13 NOTE — Progress Notes (Signed)
[facility] [address] [place] [address] [phone]  Date:  07/13/2015   Name:  Charles [name]   DOB:  08-09-1955   MRN:  [id number]  PCP:  Pcp Not In System    Chief Complaint: Annual Exam   History of Present Illness:  Charles [name] is a 59 y.o. very pleasant male patient who presents with the following:  Here today for a CPE- history of GERD, high cholesterol, prior history of smoking now quit, CLL.  Oncology: Lattie Haw. He is in remission, follow-up for observation only. He did chemo back in 2014.    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    Colonoscopy: he has never had one.  He has intended to but every time he tries something happens to delay it.  He would be interested in cologuard Tetanus is UTD Flu shot: UTD CMP, CBC earlier this month  He is fasting today.   He is married- his wife has had a lot of illness recently.  She had a stroke and open heart surgery in the last couple of years.    Overall he is feeling well. He does notice a skin lesion on the right side of his nose.  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  Would like to follow-up with her He also notes a positional numbness in the lateral fingers of his left hand over the last year or so. If he changes position this will go away.  He has not had any headaches or other neurological sx.    He does have bradycardia- he states this is baseline for him, he never has any dizziness or syncope.    Pulse Readings from Last 3 Encounters:  07/13/15 54  06/24/15 53  01/13/15 57     Patient Active Problem List   Diagnosis Date Noted  . GERD (gastroesophageal reflux disease) 12/25/2012  . Pure hypercholesterolemia 12/25/2012  . Tobacco use disorder 12/25/2012  . CLL (chronic lymphocytic leukemia) ([place]) 03/18/2012    Past Medical History  Diagnosis Date  . CLL (chronic lymphoblastic leukemia) 08/03/2011  . Shingles   .  CLL (chronic lymphocytic leukemia) ([place]) 03/18/2012  . Ulcer   . GERD (gastroesophageal reflux disease)   . Arthritis     Past Surgical History  Procedure Laterality Date  . Knee surgery    . Shoulder surgery    . Hernia repair    . Wrist surgery      Social History  Substance Use Topics  . Smoking status: Former Smoker -- 2.00 packs/day for 20 years    Types: Cigarettes    Start date: 08/24/1978    Quit date: 08/24/1998  . Smokeless tobacco: Never Used     Comment: quit smoking 15 years ago  . Alcohol Use: Yes     Comment: 1 a week    Family History  Problem Relation Age of Onset  . Cancer Brother   . Hyperlipidemia Father   . Cancer Brother   . Cancer Sister   . Diabetes Mother     No Known Allergies  Medication list has been reviewed and updated.  Current Outpatient Prescriptions on File Prior to Visit  Medication Sig Dispense Refill  . aspirin 81 MG tablet Take 81 mg by mouth daily.    . cetirizine (ZYRTEC) 10 MG tablet Take 10 mg by mouth daily.    . Cholecalciferol (VITAMIN D3) 2000 UNITS  TABS Take 2,000 Units by mouth.    . CRESTOR 10 MG tablet TAKE 1 TABLET BY MOUTH EVERY MORNING. 90 tablet 0  . esomeprazole (NEXIUM) 40 MG capsule Take 1 capsule (40 mg total) by mouth daily. 90 capsule 3  . famciclovir (FAMVIR) 500 MG tablet Take 1 tablet (500 mg total) by mouth every morning. 90 tablet 3  . meloxicam (MOBIC) 15 MG tablet TAKE 1 TABLET BY MOUTH ONCE DAILY 30 tablet 7   No current facility-administered medications on file prior to visit.    Review of Systems:  As per HPI- otherwise negative.   Physical Examination: Filed Vitals:   07/13/15 0835  BP: 144/92  Pulse: 54  Temp: 97.7 F (36.5 C)  Resp: 16   Filed Vitals:   07/13/15 0835  Height: 5\' 9"  (1.753 m)  Weight: 256 lb 3.2 oz (116.212 kg)   Body mass index is 37.82 kg/(m^2). Ideal Body Weight: Weight in (lb) to have BMI = 25: 168.9  GEN: WDWN, NAD, Non-toxic, A & O x 3, overweight,  looks well HEENT: Atraumatic, Normocephalic. Neck supple. No masses, No LAD. Bilateral TM wnl, oropharynx normal.  PEERL,EOMI.  Suspicious appearing small skin lesion (under 47mm) with a rolled edge on the right side of his nose- ?basal cell  Ears and Nose: No external deformity. CV: RRR, No M/G/R. No JVD. No thrill. No extra heart sounds. PULM: CTA B, no wheezes, crackles, rhonchi. No retractions. No resp. distress. No accessory muscle use. ABD: S, NT, ND. No rebound. No HSM. EXTR: No c/c/e NEURO Normal gait.  Normal strength of bilateral hands and arms  PSYCH: Normally interactive. Conversant. Not depressed or anxious appearing.  Calm demeanor.  GU: normal penis, testes and DRE/ prostate   Assessment and Plan: Physical exam  Screening for diabetes mellitus - Plan: Hemoglobin A1c  Screening examination for infectious disease - Plan: HIV antibody, Hepatitis C antibody  Hyperlipidemia - Plan: Lipid panel  Screening for prostate cancer - Plan: PSA  History of smoking  Bradycardia   Here today for a CPE.  Await labs as above, check on his DM Discussed screening CT for lung cancer Discussed possible skin cancer- he will see his derm asap Offered to refer to neurology for possible ulnar nerve impingement issue.  He declines for now but will keep me posted  Discussed bradycardia and sx that should trigger further evaluation   Signed Lamar Blinks, MD

## 2015-07-14 ENCOUNTER — Encounter: Payer: Self-pay | Admitting: Family Medicine

## 2015-07-14 DIAGNOSIS — R7303 Prediabetes: Secondary | ICD-10-CM | POA: Insufficient documentation

## 2015-07-14 DIAGNOSIS — E669 Obesity, unspecified: Secondary | ICD-10-CM | POA: Insufficient documentation

## 2015-07-14 LAB — PSA: PSA: 0.02 ng/mL (ref <=4.00)

## 2015-07-14 LAB — HEPATITIS C ANTIBODY: HCV Ab: NEGATIVE

## 2015-07-25 ENCOUNTER — Other Ambulatory Visit: Payer: Self-pay | Admitting: Family Medicine

## 2015-07-25 MED FILL — ROSUVASTATIN CALCIUM 10 MG: 10 | 90 days supply | Qty: 90 | Fill #0

## 2015-07-25 MED FILL — MELOXICAM 15 MG TABLET: 15 | 30 days supply | Qty: 30 | Fill #4

## 2015-07-27 ENCOUNTER — Other Ambulatory Visit: Payer: Self-pay

## 2015-07-27 DIAGNOSIS — M722 Plantar fascial fibromatosis: Secondary | ICD-10-CM

## 2015-07-27 MED ORDER — ESOMEPRAZOLE MAGNESIUM 40 MG PO CPDR: 40.0000 mg | DELAYED_RELEASE_CAPSULE | Freq: Every day | ORAL | Status: DC

## 2015-07-27 MED ORDER — FAMCICLOVIR 500 MG PO TABS: 500.0000 mg | ORAL_TABLET | Freq: Every morning | ORAL | Status: DC

## 2015-07-27 MED FILL — FAMCICLOVIR 500 MG TABLET: 500 | 90 days supply | Qty: 90 | Fill #0

## 2015-07-27 MED FILL — ESOMEPRAZOLE MAG DR 40 MG C: 40 | 90 days supply | Qty: 90 | Fill #0

## 2015-07-28 DIAGNOSIS — Z1212 Encounter for screening for malignant neoplasm of rectum: Secondary | ICD-10-CM | POA: Diagnosis not present

## 2015-07-28 DIAGNOSIS — Z1211 Encounter for screening for malignant neoplasm of colon: Secondary | ICD-10-CM | POA: Diagnosis not present

## 2015-07-28 LAB — COLOGUARD: Cologuard: NEGATIVE

## 2015-08-08 ENCOUNTER — Encounter: Payer: Self-pay | Admitting: Family Medicine

## 2015-08-22 MED FILL — MELOXICAM 15 MG TABLET: 15 | 30 days supply | Qty: 30 | Fill #5

## 2015-09-19 MED FILL — MELOXICAM 15 MG TABLET: 15 | 30 days supply | Qty: 30 | Fill #6

## 2015-09-21 DIAGNOSIS — H17822 Peripheral opacity of cornea, left eye: Secondary | ICD-10-CM | POA: Diagnosis not present

## 2015-09-21 DIAGNOSIS — D3131 Benign neoplasm of right choroid: Secondary | ICD-10-CM | POA: Diagnosis not present

## 2015-09-21 DIAGNOSIS — H16223 Keratoconjunctivitis sicca, not specified as Sjogren's, bilateral: Secondary | ICD-10-CM | POA: Diagnosis not present

## 2015-10-21 ENCOUNTER — Other Ambulatory Visit: Payer: Self-pay | Admitting: Family Medicine

## 2015-10-21 MED FILL — ROSUVASTATIN CALCIUM 10 MG: 10 | 90 days supply | Qty: 90 | Fill #0

## 2015-10-21 MED FILL — FAMCICLOVIR 500 MG TABLET: 500 | 90 days supply | Qty: 90 | Fill #1

## 2015-10-21 MED FILL — ESOMEPRAZOLE MAG DR 40 MG C: 40 | 90 days supply | Qty: 90 | Fill #1

## 2015-10-21 MED FILL — MELOXICAM 15 MG TABLET: 15 | 30 days supply | Qty: 30 | Fill #7

## 2015-11-22 ENCOUNTER — Other Ambulatory Visit: Payer: Self-pay | Admitting: Podiatry

## 2015-11-23 MED FILL — MELOXICAM 15 MG TABLET: 15 | 30 days supply | Qty: 30 | Fill #0

## 2015-11-23 NOTE — Telephone Encounter (Signed)
Pt needs an appt prior to future refills. 

## 2015-12-23 ENCOUNTER — Other Ambulatory Visit: Payer: Self-pay | Admitting: Podiatry

## 2015-12-23 MED FILL — MELOXICAM 15 MG TABLET: 15 | 90 days supply | Qty: 90 | Fill #0

## 2015-12-23 NOTE — Telephone Encounter (Signed)
Pt should contact our office for an appt if problem continues.

## 2015-12-27 ENCOUNTER — Ambulatory Visit (HOSPITAL_BASED_OUTPATIENT_CLINIC_OR_DEPARTMENT_OTHER): Payer: 59 | Admitting: Hematology & Oncology

## 2015-12-27 ENCOUNTER — Other Ambulatory Visit (HOSPITAL_BASED_OUTPATIENT_CLINIC_OR_DEPARTMENT_OTHER): Payer: 59

## 2015-12-27 ENCOUNTER — Encounter: Payer: Self-pay | Admitting: Hematology & Oncology

## 2015-12-27 VITALS — BP 168/83 | HR 50 | Temp 97.4°F | Resp 16 | Ht 69.0 in | Wt 266.0 lb

## 2015-12-27 DIAGNOSIS — K219 Gastro-esophageal reflux disease without esophagitis: Secondary | ICD-10-CM | POA: Diagnosis not present

## 2015-12-27 DIAGNOSIS — C9111 Chronic lymphocytic leukemia of B-cell type in remission: Secondary | ICD-10-CM

## 2015-12-27 DIAGNOSIS — C9110 Chronic lymphocytic leukemia of B-cell type not having achieved remission: Secondary | ICD-10-CM

## 2015-12-27 LAB — CBC WITH DIFFERENTIAL (CANCER CENTER ONLY)
BASO#: 0.1 10*3/uL (ref 0.0–0.2)
BASO%: 0.9 % (ref 0.0–2.0)
EOS%: 3 % (ref 0.0–7.0)
Eosinophils Absolute: 0.2 10*3/uL (ref 0.0–0.5)
HCT: 43.6 % (ref 38.7–49.9)
HGB: 15.2 g/dL (ref 13.0–17.1)
LYMPH#: 2.2 10*3/uL (ref 0.9–3.3)
LYMPH%: 31.3 % (ref 14.0–48.0)
MCH: 33.3 pg (ref 28.0–33.4)
MCHC: 34.9 g/dL (ref 32.0–35.9)
MCV: 95 fL (ref 82–98)
MONO#: 0.6 10*3/uL (ref 0.1–0.9)
MONO%: 7.9 % (ref 0.0–13.0)
NEUT#: 4 10*3/uL (ref 1.5–6.5)
NEUT%: 56.9 % (ref 40.0–80.0)
Platelets: 174 10*3/uL (ref 145–400)
RBC: 4.57 10*6/uL (ref 4.20–5.70)
RDW: 12.4 % (ref 11.1–15.7)
WBC: 7 10*3/uL (ref 4.0–10.0)

## 2015-12-27 LAB — COMPREHENSIVE METABOLIC PANEL
ALT: 20 U/L (ref 0–55)
AST: 19 U/L (ref 5–34)
Albumin: 3.9 g/dL (ref 3.5–5.0)
Alkaline Phosphatase: 64 U/L (ref 40–150)
Anion Gap: 9 mEq/L (ref 3–11)
BUN: 14 mg/dL (ref 7.0–26.0)
CO2: 28 mEq/L (ref 22–29)
Calcium: 9.3 mg/dL (ref 8.4–10.4)
Chloride: 105 mEq/L (ref 98–109)
Creatinine: 1.1 mg/dL (ref 0.7–1.3)
EGFR: 74 mL/min/{1.73_m2} — ABNORMAL LOW (ref >90)
Glucose: 124 mg/dl (ref 70–140)
Potassium: 4.1 mEq/L (ref 3.5–5.1)
Sodium: 142 mEq/L (ref 136–145)
Total Bilirubin: 0.56 mg/dL (ref 0.20–1.20)
Total Protein: 6.6 g/dL (ref 6.4–8.3)

## 2015-12-27 LAB — CHCC SATELLITE - SMEAR

## 2015-12-27 NOTE — Progress Notes (Signed)
Hematology and Oncology Follow Up Visit  [name] [id number] 1956/06/18 60 y.o. 12/27/2015   Principle Diagnosis:  Chronic lymphocytic leukemia (Trisomy 12) - stage C - remission.  Current Therapy:    Observation     Interim History:  Mr.  Charles [name] is back for followup. He's doing quite well. I run into him at the hospital every now and then. 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  His wife is slowly getting better. She has a lot of neurological issues. She had meningitis. Ablation and had a heart attack. She required bowel surgery. Thankfully, she is improving.  He has been feeling quite well. He is working all the time. He's had no problems with fever. He's had no infections. He's had no lymph nodes. He's had no change in bowel or bladder habits. He's had no cough.  Overall, his performance status is ECOG 0.  Medications:  Current outpatient prescriptions:  .  aspirin 81 MG tablet, Take 81 mg by mouth daily., Disp: , Rfl:  .  cetirizine (ZYRTEC) 10 MG tablet, Take 10 mg by mouth daily., Disp: , Rfl:  .  Cholecalciferol (VITAMIN D3) 2000 UNITS TABS, Take 2,000 Units by mouth., Disp: , Rfl:  .  esomeprazole (NEXIUM) 40 MG capsule, Take 1 capsule (40 mg total) by mouth daily., Disp: 90 capsule, Rfl: 1 .  famciclovir (FAMVIR) 500 MG tablet, Take 1 tablet (500 mg total) by mouth every morning., Disp: 90 tablet, Rfl: 3 .  meloxicam (MOBIC) 15 MG tablet, TAKE 1 TABLET BY MOUTH ONCE DAILY, Disp: 30 tablet, Rfl: 10 .  rosuvastatin (CRESTOR) 10 MG tablet, TAKE 1 TABLET BY MOUTH EVERY MORNING., Disp: 90 tablet, Rfl: 0  Allergies: No Known Allergies  Past Medical History, Surgical history, Social history, and Family History were reviewed and updated.  Review of Systems: As above  Physical Exam:  height is 5\' 9"  (1.753 m) and weight is 266 lb (120.657 kg). His oral temperature is 97.4 F (36.3 C). His blood pressure is 168/83 and his pulse is 50. His  respiration is 16.   Well-developed and well-nourished white male. Head and neck exam shows no ocular or oral lesions. There is no adenopathy in the neck. Lungs are clear. Cardiac exam regular rate and rhythm with no murmurs, rubs or bruits. Abdomen is soft. He has good bowel sounds. There is no fluid wave. There is a palpable liver edge. There is a palpable splenomegaly. Back exam no tenderness over the spine ribs or hips. Extremities shows no clubbing cyanosis or edema. Neurological exam shows no focal neurological deficits. Skin exam no rashes ecchymoses or petechia. Axillary exam shows no bilateral axillary adenopathy.  Lab Results  Component Value Date   WBC 7.0 12/27/2015   HGB 15.2 12/27/2015   HCT 43.6 12/27/2015   MCV 95 12/27/2015   PLT 174 12/27/2015     Chemistry      Component Value Date/Time   NA 139 06/24/2015 0958   NA 139 12/22/2014 0925   NA 137 04/10/2012 0815   K 4.0 06/24/2015 0958   K 4.3 12/22/2014 0925   K 4.1 04/10/2012 0815   CL 102 12/22/2014 0925   CL 102 04/10/2012 0815   CO2 22 06/24/2015 0958   CO2 23 12/22/2014 0925   CO2 27 04/10/2012 0815   BUN 9.8 06/24/2015 0958   BUN 14 12/22/2014 0925   BUN 16 04/10/2012 0815   CREATININE 1.0 06/24/2015 [id number]  CREATININE 1.02 12/22/2014 0925   CREATININE 1.04 07/08/2014 0830      Component Value Date/Time   CALCIUM 9.5 06/24/2015 0958   CALCIUM 9.1 12/22/2014 0925   CALCIUM 9.3 04/10/2012 0815   ALKPHOS 81 06/24/2015 0958   ALKPHOS 77 12/22/2014 0925   ALKPHOS 97* 04/10/2012 0815   AST 20 06/24/2015 0958   AST 19 12/22/2014 0925   AST 22 04/10/2012 0815   ALT 16 06/24/2015 0958   ALT 16 12/22/2014 0925   ALT 14 04/10/2012 0815   BILITOT 0.76 06/24/2015 0958   BILITOT 0.6 12/22/2014 0925   BILITOT 0.90 04/10/2012 0815         Impression and Plan: Charles [name] is 60 year old male with CLL. He had good chromosomes. He had a trisomy 12 chromosome abnormality.  He finished chemotherapy with  Rituxan/Treanda back in February 2014. He had 6 cycles.  I do not see any evidence of recurrent disease.  Of note, I did pull a tick out of his back. I told him if he starts having any kind of rash, temperature, chills, to let us know and we can get him on antibiotics.  We will go ahead and plan for another six-month follow-up. I think all is good we see him back in 6 months   Volanda Napoleon, MD 6/6/201711:42 AM

## 2015-12-28 LAB — IGG, IGA, IGM
IgA, Qn, Serum: 122 mg/dL (ref 61–437)
IgG, Qn, Serum: 708 mg/dL (ref 700–1600)
IgM, Qn, Serum: 53 mg/dL (ref 20–172)

## 2015-12-29 LAB — PROTEIN ELECTROPHORESIS, SERUM, WITH REFLEX
A/G Ratio: 1.4 (ref 0.7–1.7)
Albumin: 3.7 g/dL (ref 2.9–4.4)
Alpha 1: 0.2 g/dL (ref 0.0–0.4)
Alpha 2: 0.7 g/dL (ref 0.4–1.0)
Beta: 0.9 g/dL (ref 0.7–1.3)
Gamma Globulin: 0.8 g/dL (ref 0.4–1.8)
Globulin, Total: 2.6 g/dL (ref 2.2–3.9)
Total Protein: 6.3 g/dL (ref 6.0–8.5)

## 2015-12-31 ENCOUNTER — Ambulatory Visit (INDEPENDENT_AMBULATORY_CARE_PROVIDER_SITE_OTHER): Payer: 59 | Admitting: Physician Assistant

## 2015-12-31 VITALS — BP 140/88 | HR 60 | Temp 98.9°F | Resp 18 | Ht 69.0 in | Wt 262.0 lb

## 2015-12-31 DIAGNOSIS — H1013 Acute atopic conjunctivitis, bilateral: Secondary | ICD-10-CM | POA: Diagnosis not present

## 2015-12-31 MED ORDER — OLOPATADINE HCL 0.1 % OP SOLN: 1.0000 [drp] | Freq: Two times a day (BID) | OPHTHALMIC | Status: DC

## 2015-12-31 NOTE — Progress Notes (Signed)
   Charles [name]  MRN: [id number] DOB: [birth year]  Subjective:  Pt presents to clinic with right eye redness and increased tearing and am matting over the last 2-3 days.  He has no sensation that something is in his eye and he has had no trauma to his eye.  He suffers from dry eye but it never tears this bad and he has h/o pink eye.  He does not use anything for his dry eye because he has a really hard time with eye drops.  Wears glasses.  Patient Active Problem List   Diagnosis Date Noted  . Pre-diabetes 07/14/2015  . Obesity 07/14/2015  . GERD (gastroesophageal reflux disease) 12/25/2012  . Pure hypercholesterolemia 12/25/2012  . Tobacco use disorder 12/25/2012  . CLL (chronic lymphocytic leukemia) ([place]) 03/18/2012    Current Outpatient Prescriptions on File Prior to Visit  Medication Sig Dispense Refill  . aspirin 81 MG tablet Take 81 mg by mouth daily.    . cetirizine (ZYRTEC) 10 MG tablet Take 10 mg by mouth daily.    . Cholecalciferol (VITAMIN D3) 2000 UNITS TABS Take 2,000 Units by mouth.    . esomeprazole (NEXIUM) 40 MG capsule Take 1 capsule (40 mg total) by mouth daily. 90 capsule 1  . famciclovir (FAMVIR) 500 MG tablet Take 1 tablet (500 mg total) by mouth every morning. 90 tablet 3  . meloxicam (MOBIC) 15 MG tablet TAKE 1 TABLET BY MOUTH ONCE DAILY 30 tablet 10  . rosuvastatin (CRESTOR) 10 MG tablet TAKE 1 TABLET BY MOUTH EVERY MORNING. 90 tablet 0   No current facility-administered medications on file prior to visit.    No Known Allergies  Review of Systems  Eyes: Positive for discharge (tearing) and redness. Negative for photophobia, pain, itching and visual disturbance.   Objective:  BP 140/88 mmHg  Pulse 60  Temp(Src) 98.9 F (37.2 C) (Oral)  Resp 18  Ht 5\' 9"  (1.753 m)  Wt 262 lb (118.842 kg)  BMI 38.67 kg/m2  SpO2 96%  Physical Exam  Constitutional: He is oriented to person, place, and time and well-developed, well-nourished, and in no distress.    HENT:  Head: Normocephalic and atraumatic.  Right Ear: External ear normal.  Left Ear: External ear normal.  Eyes: EOM are normal. Pupils are equal, round, and reactive to light. Right eye exhibits discharge (tearing). Left eye exhibits no discharge. Right conjunctiva is injected (lower aspect). Right conjunctiva has no hemorrhage. Left conjunctiva is not injected. Left conjunctiva has no hemorrhage.  Cobblestoning on lower lids bilaterally - worse on the right Mild edema of bilateral upper lids without erythema  Neck: Normal range of motion.  Pulmonary/Chest: Effort normal.  Neurological: He is alert and oriented to person, place, and time. Gait normal.  Skin: Skin is warm and dry.  Psychiatric: Mood, memory, affect and judgment normal.     Assessment and Plan :  Allergic conjunctivitis, bilateral - Plan: olopatadine (PATANOL) 0.1 % ophthalmic solution   Cool compresses for swelling - this seems allergic and we will treat as such - he will let me know if he starts having purulent drainage from his eye.  We discussed trying to use a gel for his dry eye because that might be easier for him vs the drops.  He states his wife will help with the drops this weekend while he is off.  Windell Hummingbird PA-C  [facility] 12/31/2015 11:47 AM

## 2015-12-31 NOTE — Patient Instructions (Addendum)
  zaditor - over the counter eye drop - if the Rx that I sent to the pharmacy is to expensive    IF you received an x-ray today, you will receive an invoice from [facility]. Please contact [facility] at [phone] with questions or concerns regarding your invoice.   IF you received labwork today, you will receive an invoice from [employer]. Please contact [facility] at [phone] with questions or concerns regarding your invoice.   Our billing staff will not be able to assist you with questions regarding bills from these companies.  You will be contacted with the lab results as soon as they are available. The fastest way to get your results is to activate your My Chart account. Instructions are located on the last page of this paperwork. If you have not heard from [place] regarding the results in 2 weeks, please contact this office.

## 2016-02-02 ENCOUNTER — Other Ambulatory Visit: Payer: Self-pay | Admitting: Family Medicine

## 2016-02-02 MED FILL — ROSUVASTATIN CALCIUM 10 MG: 10 | 90 days supply | Qty: 90 | Fill #0

## 2016-02-02 MED FILL — FAMCICLOVIR 500 MG TABLET: 500 | 90 days supply | Qty: 90 | Fill #2

## 2016-02-02 MED FILL — ESOMEPRAZOLE MAG DR 40 MG C: 40 | 90 days supply | Qty: 90 | Fill #0

## 2016-03-21 MED FILL — MELOXICAM 15 MG TABLET: 15 | 90 days supply | Qty: 90 | Fill #1

## 2016-04-26 MED FILL — FAMCICLOVIR 500 MG TABLET: 500 | 90 days supply | Qty: 90 | Fill #3

## 2016-04-26 MED FILL — ROSUVASTATIN CALCIUM 10 MG: 10 | 90 days supply | Qty: 90 | Fill #1

## 2016-04-26 MED FILL — ESOMEPRAZOLE MAG DR 40 MG C: 40 | 90 days supply | Qty: 90 | Fill #1

## 2016-05-11 ENCOUNTER — Telehealth: Payer: Self-pay | Admitting: Family Medicine

## 2016-05-11 NOTE — Telephone Encounter (Signed)
lmom to call and reschedule his appointment that he had with Brigitte Pulse on [phone] she will not be in the office that day I also lm with his wife to give [place] a call back to reschedule

## 2016-06-25 MED FILL — MELOXICAM 15 MG TABLET: 15 | 90 days supply | Qty: 90 | Fill #2

## 2016-06-26 ENCOUNTER — Ambulatory Visit (HOSPITAL_BASED_OUTPATIENT_CLINIC_OR_DEPARTMENT_OTHER): Payer: 59 | Admitting: Hematology & Oncology

## 2016-06-26 ENCOUNTER — Other Ambulatory Visit (HOSPITAL_BASED_OUTPATIENT_CLINIC_OR_DEPARTMENT_OTHER): Payer: 59

## 2016-06-26 VITALS — BP 175/95 | HR 56 | Temp 98.0°F | Wt 257.4 lb

## 2016-06-26 DIAGNOSIS — C9111 Chronic lymphocytic leukemia of B-cell type in remission: Secondary | ICD-10-CM | POA: Diagnosis not present

## 2016-06-26 DIAGNOSIS — C9110 Chronic lymphocytic leukemia of B-cell type not having achieved remission: Secondary | ICD-10-CM

## 2016-06-26 LAB — CBC WITH DIFFERENTIAL (CANCER CENTER ONLY)
BASO#: 0.1 10*3/uL (ref 0.0–0.2)
BASO%: 0.9 % (ref 0.0–2.0)
EOS%: 2.8 % (ref 0.0–7.0)
Eosinophils Absolute: 0.2 10*3/uL (ref 0.0–0.5)
HCT: 45 % (ref 38.7–49.9)
HGB: 15.9 g/dL (ref 13.0–17.1)
LYMPH#: 2.2 10*3/uL (ref 0.9–3.3)
LYMPH%: 32.3 % (ref 14.0–48.0)
MCH: 32.8 pg (ref 28.0–33.4)
MCHC: 35.3 g/dL (ref 32.0–35.9)
MCV: 93 fL (ref 82–98)
MONO#: 0.5 10*3/uL (ref 0.1–0.9)
MONO%: 7.7 % (ref 0.0–13.0)
NEUT#: 3.9 10*3/uL (ref 1.5–6.5)
NEUT%: 56.3 % (ref 40.0–80.0)
Platelets: 187 10*3/uL (ref 145–400)
RBC: 4.85 10*6/uL (ref 4.20–5.70)
RDW: 12.4 % (ref 11.1–15.7)
WBC: 6.9 10*3/uL (ref 4.0–10.0)

## 2016-06-26 LAB — COMPREHENSIVE METABOLIC PANEL
ALT: 19 U/L (ref 0–55)
AST: 20 U/L (ref 5–34)
Albumin: 3.9 g/dL (ref 3.5–5.0)
Alkaline Phosphatase: 83 U/L (ref 40–150)
Anion Gap: 9 mEq/L (ref 3–11)
BUN: 13.5 mg/dL (ref 7.0–26.0)
CO2: 27 mEq/L (ref 22–29)
Calcium: 9.5 mg/dL (ref 8.4–10.4)
Chloride: 103 mEq/L (ref 98–109)
Creatinine: 1.1 mg/dL (ref 0.7–1.3)
EGFR: 75 mL/min/{1.73_m2} — ABNORMAL LOW (ref >90)
Glucose: 93 mg/dl (ref 70–140)
Potassium: 4.4 mEq/L (ref 3.5–5.1)
Sodium: 139 mEq/L (ref 136–145)
Total Bilirubin: 0.69 mg/dL (ref 0.20–1.20)
Total Protein: 7.1 g/dL (ref 6.4–8.3)

## 2016-06-26 LAB — CHCC SATELLITE - SMEAR

## 2016-06-26 NOTE — Progress Notes (Signed)
Hematology and Oncology Follow Up Visit  Charles MCILROY [id number] 04-Jun-1956 60 y.o. 06/26/2016   Principle Diagnosis:  Chronic lymphocytic leukemia (Trisomy 12) - stage C - remission.  Current Therapy:    Observation     Interim History:  Mr.  [name] is back for followup. He's doing quite well. I run into him at the hospital every now and then. 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  His wife is slowly getting better. She has a lot of neurological issues. She had meningitis and had a heart attack. She required bowel surgery. Thankfully, she is improving. She is still getting some rehabilitation.  He has been feeling quite well. He is working all the time. He's had no problems with fever. He's had no infections. He's had no lymph nodes. He's had no change in bowel or bladder habits. He's had no cough.  Overall, his performance status is ECOG 0.  Medications:  Current Outpatient Prescriptions:  .  aspirin 81 MG tablet, Take 81 mg by mouth daily., Disp: , Rfl:  .  cetirizine (ZYRTEC) 10 MG tablet, Take 10 mg by mouth daily., Disp: , Rfl:  .  Cholecalciferol (VITAMIN D3) 2000 UNITS TABS, Take 2,000 Units by mouth., Disp: , Rfl:  .  esomeprazole (NEXIUM) 40 MG capsule, TAKE 1 CAPSULE BY MOUTH DAILY., Disp: 90 capsule, Rfl: 1 .  famciclovir (FAMVIR) 500 MG tablet, Take 1 tablet (500 mg total) by mouth every morning., Disp: 90 tablet, Rfl: 3 .  meloxicam (MOBIC) 15 MG tablet, TAKE 1 TABLET BY MOUTH ONCE DAILY, Disp: 30 tablet, Rfl: 10 .  olopatadine (PATANOL) 0.1 % ophthalmic solution, Place 1 drop into both eyes 2 (two) times daily., Disp: 5 mL, Rfl: 0 .  rosuvastatin (CRESTOR) 10 MG tablet, TAKE 1 TABLET BY MOUTH EVERY MORNING., Disp: 90 tablet, Rfl: 3  Allergies: No Known Allergies  Past Medical History, Surgical history, Social history, and Family History were reviewed and updated.  Review of Systems: As above  Physical Exam:  weight is 257 lb  6.4 oz (116.8 kg). His oral temperature is 98 F (36.7 C). His blood pressure is 175/95 (abnormal) and his pulse is 56 (abnormal).   Well-developed and well-nourished white male. Head and neck exam shows no ocular or oral lesions. There is no adenopathy in the neck. Lungs are clear. Cardiac exam regular rate and rhythm with no murmurs, rubs or bruits. Abdomen is soft. He has good bowel sounds. There is no fluid wave. There is a palpable liver edge. There is a palpable splenomegaly. Back exam no tenderness over the spine ribs or hips. Extremities shows no clubbing cyanosis or edema. Neurological exam shows no focal neurological deficits. Skin exam no rashes ecchymoses or petechia. Axillary exam shows no bilateral axillary adenopathy.  Lab Results  Component Value Date   WBC 6.9 06/26/2016   HGB 15.9 06/26/2016   HCT 45.0 06/26/2016   MCV 93 06/26/2016   PLT 187 06/26/2016     Chemistry      Component Value Date/Time   NA 142 12/27/2015 1046   K 4.1 12/27/2015 1046   CL 102 12/22/2014 0925   CL 102 04/10/2012 0815   CO2 28 12/27/2015 1046   BUN 14.0 12/27/2015 1046   CREATININE 1.1 12/27/2015 1046      Component Value Date/Time   CALCIUM 9.3 12/27/2015 1046   ALKPHOS 64 12/27/2015 1046   AST 19 12/27/2015 1046   ALT 20  12/27/2015 1046   BILITOT 0.56 12/27/2015 1046         Impression and Plan: [name] is 60 year old male with CLL. He had good chromosomes. He had a trisomy 12 chromosome abnormality.  He finished chemotherapy with Rituxan/Treanda back in February 2014. He had 6 cycles.  I do not see any evidence of recurrent disease.  I will plan to see him back in 6 months.  12/5/201711:39 AM

## 2016-07-12 ENCOUNTER — Encounter: Payer: 59 | Admitting: Family Medicine

## 2016-07-30 ENCOUNTER — Other Ambulatory Visit: Payer: Self-pay | Admitting: Family Medicine

## 2016-07-30 ENCOUNTER — Other Ambulatory Visit: Payer: Self-pay | Admitting: Emergency Medicine

## 2016-07-30 MED FILL — ROSUVASTATIN CALCIUM 10 MG: 10 | 90 days supply | Qty: 90 | Fill #2

## 2016-08-01 ENCOUNTER — Other Ambulatory Visit: Payer: Self-pay | Admitting: Family Medicine

## 2016-08-01 NOTE — Progress Notes (Signed)
Subjective:    Patient ID: Charles [name], male    DOB: 1956-03-21, 61 y.o.   MRN: [id number] Chief Complaint  Patient presents with  . Annual Exam   HPI  Charles [name] is a 61 yo male who is here today for his complete physical and wellness exam.  He was a patient of a former partner and this is my first time meeting Charles [name].  His last CPE was 1 yr prior.  Primary Preventative Screenings: Prostate Cancer: annual psa and dre nml STI screening: neg HIV and Hep C screen 1 yr prior Colorectal Cancer: cologuard 07/2015 negative H/o tobacco use AAA/Lung: H/o tobacco use 2 ppd x 20 yrs but quit in 2000. Will qualify for AAA screen but not lung CT screening. Cardiac: EKG 12/15 on chart, has not seen cardiology prior. Weight/blood sugar: initial a1c last year 5.8, + obesity OTC/vit/supp/herbal: Diet/Exercise: None Dentist/Optho: Immunizations: tdap 2013 and flu UTD annually mandatory with work; has had the shingles before but does not have Zostavax. Does not think he has had pneumovax. Brother deceased at 61 yo with stage 4 prostate cancer and other brother w/prostate cancer in his earlier 34s. Providers: Onc: Dr. Marin Olp Derm: Dr. Sydnee Levans at [place]: Dr. Milinda Pointer  Chronic Medical Conditions: HPL: Crestor 10 qd HTN: white coat?  He has a home cuff that he checks periodically and it varies but is above goal often Pre-DM; hgba1c 1 yr prior was 5.8 CLL:  Chemo in 2014. Follows regularly with Dr. Burney Gauze every 6 mos for observation as in remission. Had nml cmp and cbc 1 mo prior. GERD: nexium 40 qd. occ flairs when laying flat.   Bradycardic at baseline with heart rate in the 50s. No history of problems or symptoms from this Arthritis: prn meloxicam 15 mg. He actually stays on this from Dr. Milinda Pointer for plantar fasccitis.  On daily famciclover from shingles. Allergic rhinitis: recurrent, uses zyrtec. Notices decreased hearing. From working in loud  environments all his life.   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 He is married and his wife has had medical problems such as CVA and has undergone open heart surgery.  Past Medical History:  Diagnosis Date  . Allergy   . Arthritis   . CLL (chronic lymphoblastic leukemia) 08/03/2011  . CLL (chronic lymphocytic leukemia) ([place]) 03/18/2012  . GERD (gastroesophageal reflux disease)   . Shingles   . Ulcer [facility])    Past Surgical History:  Procedure Laterality Date  . HERNIA REPAIR    . KNEE SURGERY    . SHOULDER SURGERY    . WRIST SURGERY     Current Outpatient Prescriptions on File Prior to Visit  Medication Sig Dispense Refill  . aspirin 81 MG tablet Take 81 mg by mouth daily.    . cetirizine (ZYRTEC) 10 MG tablet Take 10 mg by mouth daily.    . Cholecalciferol (VITAMIN D3) 2000 UNITS TABS Take 2,000 Units by mouth.    . esomeprazole (NEXIUM) 40 MG capsule TAKE 1 CAPSULE BY MOUTH DAILY. 90 capsule 1  . famciclovir (FAMVIR) 500 MG tablet Take 1 tablet (500 mg total) by mouth every morning. 90 tablet 3  . meloxicam (MOBIC) 15 MG tablet TAKE 1 TABLET BY MOUTH ONCE DAILY 30 tablet 10  . olopatadine (PATANOL) 0.1 % ophthalmic solution Place 1 drop into both eyes 2 (two) times daily. 5 mL 0  . rosuvastatin (CRESTOR) 10  MG tablet TAKE 1 TABLET BY MOUTH EVERY MORNING. 90 tablet 3   No current facility-administered medications on file prior to visit.    No Known Allergies Family History  Problem Relation Age of Onset  . Cancer Brother   . Hyperlipidemia Father   . Cancer Brother   . Cancer Sister   . Diabetes Mother    Social History   Social History  . Marital status: Married    Spouse name: N/A  . Number of children: N/A  . Years of education: N/A   Occupational History  . [employer]    Social History Main Topics  . Smoking status: Former Smoker    Packs/day: 2.00    Years: 20.00    Types: Cigarettes    Start date:  08/24/1978    Quit date: 08/24/1998  . Smokeless tobacco: Never Used     Comment: quit smoking 15 years ago  . Alcohol use 0.0 oz/week     Comment: 1 a week  . Drug use: No  . Sexual activity: Yes    Partners: Female    Birth control/ protection: None   Other Topics Concern  . None   Social History Narrative   Married. Education: [employer]. Exercise: Walk    Education: [employer]   Works for [employer] in [place]  Administered Date(s) Administered  . Hepatitis B 07/23/1984  . Influenza Split 04/10/2012, 08/01/2013, 04/27/2015  . Tdap 04/22/2012   Depression screen [facility] 2/9 08/02/2016 12/31/2015 07/13/2015 07/08/2014  Decreased Interest 0 0 0 0  Down, Depressed, Hopeless 0 0 0 0  PHQ - 2 Score 0 0 0 0      Review of Systems  HENT: Positive for hearing loss.   All other systems reviewed and are negative.      Objective:   Physical Exam  Constitutional: He is oriented to person, place, and time. He appears well-developed and well-nourished. No distress.  HENT:  Head: Normocephalic and atraumatic.  Right Ear: Tympanic membrane, external ear and ear canal normal.  Left Ear: Tympanic membrane, external ear and ear canal normal.  Nose: Nose normal.  Mouth/Throat: Uvula is midline, oropharynx is clear and moist and mucous membranes are normal. No oropharyngeal exudate.  Eyes: Conjunctivae are normal. Right eye exhibits no discharge. Left eye exhibits no discharge. No scleral icterus.  Neck: Normal range of motion. Neck supple. No thyromegaly present.  Cardiovascular: Normal rate, regular rhythm, normal heart sounds and intact distal pulses.   Pulmonary/Chest: Effort normal and breath sounds normal. No respiratory distress.  Abdominal: Soft. Bowel sounds are normal. He exhibits no distension and no mass. There is no tenderness. There is no rebound and no guarding.  Genitourinary: Rectal exam shows external hemorrhoid. Rectal exam shows no mass, no tenderness  and anal tone normal. Prostate is not enlarged.  Musculoskeletal: He exhibits no edema.  Lymphadenopathy:    He has no cervical adenopathy.  Neurological: He is alert and oriented to person, place, and time. He has normal reflexes. No cranial nerve deficit. He exhibits normal muscle tone.  Skin: Skin is warm and dry. No rash noted. He is not diaphoretic. No erythema.  Psychiatric: He has a normal mood and affect. His behavior is normal.      BP (!) 152/86 (BP Location: Right Arm, Patient Position: Sitting, Cuff Size: Large)   Pulse 60   Temp 97.9 F (36.6 C) (Oral)   Resp 18   Ht 5\' 9"  (  1.753 m)   Wt 254 lb (115.2 kg)   SpO2 95%   BMI 37.51 kg/m      Assessment & Plan:   1. Annual physical exam - he does need prevnar and then pneumovax at least 2 mos later due to CLL- will ask pt to RTC for nurse only visit for this at his convenience or f/u OV in sev mos to recheck BP. No live vaccines so no zostavax.  2. Screening for cardiovascular, respiratory, and genitourinary diseases   3. Screening for thyroid disorder   4. Screening for prostate cancer   5. Pure hypercholesterolemia - cont crestor 10, lipids at goal with LDL 82, non-hdl 109  6. Pre-diabetes   7. Medication monitoring encounter   8. Bilateral hearing loss, unspecified hearing loss type - Refer to audiology  9. Essential hypertension, benign - Need to start blood pressure medication - start lisinopril-hctz 10-12.5. Recheck in 1-2 mos with bmp.  10. Gastroesophageal reflux disease without esophagitis - Consider h. Pylori testing but he doesn't think he can come off of the nexium for long enough to be able to do a breath test.   11. CLL (chronic lymphocytic leukemia) (H[facility]) - follows q6 mos with Dr. Marin Olp, in remission. No live vaccines. On famciclovir daily to prevent shingles recurrence?     Orders Placed This Encounter  Procedures  . TSH  . PSA  . Lipid panel    Order Specific Question:   Has the patient fasted?     Answer:   Yes  . Hemoglobin A1c  . Ambulatory referral to Audiology    Referral Priority:   Routine    Referral Type:   Audiology Exam    Referral Reason:   Specialty Services Required    Number of Visits Requested:   1  . POCT urinalysis dipstick    Meds ordered this encounter  Medications  . famciclovir (FAMVIR) 500 MG tablet    Sig: Take 1 tablet (500 mg total) by mouth every morning.    Dispense:  90 tablet    Refill:  3  . esomeprazole (NEXIUM) 40 MG capsule    Sig: Take 1 capsule (40 mg total) by mouth daily.    Dispense:  90 capsule    Refill:  3  . lisinopril-hydrochlorothiazide (PRINZIDE,ZESTORETIC) 10-12.5 MG tablet    Sig: Take 1 tablet by mouth daily.    Dispense:  90 tablet    Refill:  0     Delman Cheadle, M.D.  [facility] [place] [address] [place], [place] [address] [phone] phone [phone] fax  08/28/16 9:56 PM

## 2016-08-02 ENCOUNTER — Encounter: Payer: Self-pay | Admitting: Family Medicine

## 2016-08-02 ENCOUNTER — Ambulatory Visit (INDEPENDENT_AMBULATORY_CARE_PROVIDER_SITE_OTHER): Payer: 59 | Admitting: Family Medicine

## 2016-08-02 VITALS — BP 152/86 | HR 60 | Temp 97.9°F | Resp 18 | Ht 69.0 in | Wt 254.0 lb

## 2016-08-02 DIAGNOSIS — Z125 Encounter for screening for malignant neoplasm of prostate: Secondary | ICD-10-CM

## 2016-08-02 DIAGNOSIS — Z1389 Encounter for screening for other disorder: Secondary | ICD-10-CM

## 2016-08-02 DIAGNOSIS — Z5181 Encounter for therapeutic drug level monitoring: Secondary | ICD-10-CM | POA: Diagnosis not present

## 2016-08-02 DIAGNOSIS — E7800 Pure hypercholesterolemia, unspecified: Secondary | ICD-10-CM

## 2016-08-02 DIAGNOSIS — Z0000 Encounter for general adult medical examination without abnormal findings: Secondary | ICD-10-CM

## 2016-08-02 DIAGNOSIS — Z1383 Encounter for screening for respiratory disorder NEC: Secondary | ICD-10-CM

## 2016-08-02 DIAGNOSIS — K219 Gastro-esophageal reflux disease without esophagitis: Secondary | ICD-10-CM | POA: Diagnosis not present

## 2016-08-02 DIAGNOSIS — Z136 Encounter for screening for cardiovascular disorders: Secondary | ICD-10-CM

## 2016-08-02 DIAGNOSIS — C9110 Chronic lymphocytic leukemia of B-cell type not having achieved remission: Secondary | ICD-10-CM | POA: Diagnosis not present

## 2016-08-02 DIAGNOSIS — H9193 Unspecified hearing loss, bilateral: Secondary | ICD-10-CM

## 2016-08-02 DIAGNOSIS — Z1329 Encounter for screening for other suspected endocrine disorder: Secondary | ICD-10-CM | POA: Diagnosis not present

## 2016-08-02 DIAGNOSIS — I10 Essential (primary) hypertension: Secondary | ICD-10-CM | POA: Insufficient documentation

## 2016-08-02 DIAGNOSIS — R7303 Prediabetes: Secondary | ICD-10-CM

## 2016-08-02 MED ORDER — FAMCICLOVIR 500 MG PO TABS: 500.0000 mg | ORAL_TABLET | Freq: Every morning | ORAL | 3 refills | Status: DC

## 2016-08-02 MED ORDER — ESOMEPRAZOLE MAGNESIUM 40 MG PO CPDR: 40.0000 mg | DELAYED_RELEASE_CAPSULE | Freq: Every day | ORAL | 3 refills | Status: DC

## 2016-08-02 MED ORDER — LISINOPRIL-HYDROCHLOROTHIAZIDE 10-12.5 MG PO TABS: 1.0000 | ORAL_TABLET | Freq: Every day | ORAL | 0 refills | Status: DC

## 2016-08-02 MED FILL — ESOMEPRAZOLE MAG DR 40 MG C: 40 | 90 days supply | Qty: 90 | Fill #0

## 2016-08-02 MED FILL — FAMCICLOVIR 500 MG TABLET: 500 | 90 days supply | Qty: 90 | Fill #0

## 2016-08-02 MED FILL — LISINOPRIL-HCTZ 10-12.5 MG: 10-12.5 | 90 days supply | Qty: 90 | Fill #0

## 2016-08-02 NOTE — Patient Instructions (Addendum)
IF you received an x-ray today, you will receive an invoice from [facility]. Please contact [facility] at [phone] with questions or concerns regarding your invoice.   IF you received labwork today, you will receive an invoice from [facility]. Please contact [facility] at [phone] with questions or concerns regarding your invoice.   Our billing staff will not be able to assist you with questions regarding bills from these companies.  You will be contacted with the lab results as soon as they are available. The fastest way to get your results is to activate your My Chart account. Instructions are located on the last page of this paperwork. If you have not heard from [place] regarding the results in 2 weeks, please contact this office.    Health Maintenance, Male A healthy lifestyle and preventative care can promote health and wellness.  Maintain regular health, dental, and eye exams.  Eat a healthy diet. Foods like vegetables, fruits, whole grains, low-fat dairy products, and lean protein foods contain the nutrients you need and are low in calories. Decrease your intake of foods high in solid fats, added sugars, and salt. Get information about a proper diet from your health care provider, if necessary.  Regular physical exercise is one of the most important things you can do for your health. Most adults should get at least 150 minutes of moderate-intensity exercise (any activity that increases your heart rate and causes you to sweat) each week. In addition, most adults need muscle-strengthening exercises on 2 or more days a week.   Maintain a healthy weight. The body mass index (BMI) is a screening tool to identify possible weight problems. It provides an estimate of body fat based on height and weight. Your health care provider can find your BMI and can help you achieve or maintain a healthy weight. For males 20 years and older:  A BMI below 18.5 is considered  underweight.  A BMI of 18.5 to 24.9 is normal.  A BMI of 25 to 29.9 is considered overweight.  A BMI of 30 and above is considered obese.  Maintain normal blood lipids and cholesterol by exercising and minimizing your intake of saturated fat. Eat a balanced diet with plenty of fruits and vegetables. Blood tests for lipids and cholesterol should begin at age 72 and be repeated every 5 years. If your lipid or cholesterol levels are high, you are over age 81, or you are at high risk for heart disease, you may need your cholesterol levels checked more frequently.Ongoing high lipid and cholesterol levels should be treated with medicines if diet and exercise are not working.  If you smoke, find out from your health care provider how to quit. If you do not use tobacco, do not start.  Lung cancer screening is recommended for adults aged 67-80 years who are at high risk for developing lung cancer because of a history of smoking. A yearly low-dose CT scan of the lungs is recommended for people who have at least a 30-pack-year history of smoking and are current smokers or have quit within the past 15 years. A pack year of smoking is smoking an average of 1 pack of cigarettes a day for 1 year (for example, a 30-pack-year history of smoking could mean smoking 1 pack a day for 30 years or 2 packs a day for 15 years). Yearly screening should continue until the smoker has stopped smoking for at least 15 years. Yearly screening should be stopped for people who  develop a health problem that would prevent them from having lung cancer treatment.  If you choose to drink alcohol, do not have more than 2 drinks per day. One drink is considered to be 12 oz (360 mL) of beer, 5 oz (150 mL) of wine, or 1.5 oz (45 mL) of liquor.  Avoid the use of street drugs. Do not share needles with anyone. Ask for help if you need support or instructions about stopping the use of drugs.  High blood pressure causes heart disease and  increases the risk of stroke. High blood pressure is more likely to develop in:  People who have blood pressure in the end of the normal range (100-139/85-89 mm Hg).  People who are overweight or obese.  People who are African American.  If you are 65-60 years of age, have your blood pressure checked every 3-5 years. If you are 14 years of age or older, have your blood pressure checked every year. You should have your blood pressure measured twice-once when you are at a hospital or clinic, and once when you are not at a hospital or clinic. Record the average of the two measurements. To check your blood pressure when you are not at a hospital or clinic, you can use:  An automated blood pressure machine at a pharmacy.  A home blood pressure monitor.  If you are 69-19 years old, ask your health care provider if you should take aspirin to prevent heart disease.  Diabetes screening involves taking a blood sample to check your fasting blood sugar level. This should be done once every 3 years after age 35 if you are at a normal weight and without risk factors for diabetes. Testing should be considered at a younger age or be carried out more frequently if you are overweight and have at least 1 risk factor for diabetes.  Colorectal cancer can be detected and often prevented. Most routine colorectal cancer screening begins at the age of 49 and continues through age 21. However, your health care provider may recommend screening at an earlier age if you have risk factors for colon cancer. On a yearly basis, your health care provider may provide home test kits to check for hidden blood in the stool. A small camera at the end of a tube may be used to directly examine the colon (sigmoidoscopy or colonoscopy) to detect the earliest forms of colorectal cancer. Talk to your health care provider about this at age 31 when routine screening begins. A direct exam of the colon should be repeated every 5-10 years through  age 95, unless early forms of precancerous polyps or small growths are found.  People who are at an increased risk for hepatitis B should be screened for this virus. You are considered at high risk for hepatitis B if:  You were born in a country where hepatitis B occurs often. Talk with your health care provider about which countries are considered high risk.  Your parents were born in a high-risk country and you have not received a shot to protect against hepatitis B (hepatitis B vaccine).  You have HIV or AIDS.  You use needles to inject street drugs.  You live with, or have sex with, someone who has hepatitis B.  You are a man who has sex with other men (MSM).  You get hemodialysis treatment.  You take certain medicines for conditions like cancer, organ transplantation, and autoimmune conditions.  Hepatitis C blood testing is recommended for all people born  from [place] through 1965 and any individual with known risk factors for hepatitis C.  Healthy men should no longer receive prostate-specific antigen (PSA) blood tests as part of routine cancer screening. Talk to your health care provider about prostate cancer screening.  Testicular cancer screening is not recommended for adolescents or adult males who have no symptoms. Screening includes self-exam, a health care provider exam, and other screening tests. Consult with your health care provider about any symptoms you have or any concerns you have about testicular cancer.  Practice safe sex. Use condoms and avoid high-risk sexual practices to reduce the spread of sexually transmitted infections (STIs).  You should be screened for STIs, including gonorrhea and chlamydia if:  You are sexually active and are younger than 24 years.  You are older than 24 years, and your health care provider tells you that you are at risk for this type of infection.  Your sexual activity has changed since you were last screened, and you are at an  increased risk for chlamydia or gonorrhea. Ask your health care provider if you are at risk.  If you are at risk of being infected with HIV, it is recommended that you take a prescription medicine daily to prevent HIV infection. This is called pre-exposure prophylaxis (PrEP). You are considered at risk if:  You are a man who has sex with other men (MSM).  You are a heterosexual man who is sexually active with multiple partners.  You take drugs by injection.  You are sexually active with a partner who has HIV.  Talk with your health care provider about whether you are at high risk of being infected with HIV. If you choose to begin PrEP, you should first be tested for HIV. You should then be tested every 3 months for as long as you are taking PrEP.  Use sunscreen. Apply sunscreen liberally and repeatedly throughout the day. You should seek shade when your shadow is shorter than you. Protect yourself by wearing long sleeves, pants, a wide-brimmed hat, and sunglasses year round whenever you are outdoors.  Tell your health care provider of new moles or changes in moles, especially if there is a change in shape or color. Also, tell your health care provider if a mole is larger than the size of a pencil eraser.  A one-time screening for abdominal aortic aneurysm (AAA) and surgical repair of large AAAs by ultrasound is recommended for men aged 57-75 years who are current or former smokers.  Stay current with your vaccines (immunizations). This information is not intended to replace advice given to you by your health care provider. Make sure you discuss any questions you have with your health care provider. Document Released: 01/05/2008 Document Revised: 07/30/2014 Document Reviewed: 04/12/2015 Elsevier Interactive Patient Education  2017 [employer].

## 2016-08-03 LAB — HEMOGLOBIN A1C
Est. average glucose Bld gHb Est-mCnc: 126 mg/dL
Hgb A1c MFr Bld: 6 % — ABNORMAL HIGH (ref 4.8–5.6)

## 2016-08-03 LAB — LIPID PANEL
Chol/HDL Ratio: 4.5 ratio units (ref 0.0–5.0)
Cholesterol, Total: 140 mg/dL (ref 100–199)
HDL: 31 mg/dL — ABNORMAL LOW (ref >39)
LDL Calculated: 82 mg/dL (ref 0–99)
Triglycerides: 137 mg/dL (ref 0–149)
VLDL Cholesterol Cal: 27 mg/dL (ref 5–40)

## 2016-08-03 LAB — POCT URINALYSIS DIP (MANUAL ENTRY)
Bilirubin, UA: NEGATIVE
Blood, UA: NEGATIVE
Glucose, UA: NEGATIVE
Ketones, POC UA: NEGATIVE
Leukocytes, UA: NEGATIVE
Nitrite, UA: NEGATIVE
Protein Ur, POC: NEGATIVE
Spec Grav, UA: 1.02
Urobilinogen, UA: 0.2
pH, UA: 6.5

## 2016-08-03 LAB — PSA: Prostate Specific Ag, Serum: 0.4 ng/mL (ref 0.0–4.0)

## 2016-08-03 LAB — TSH: TSH: 3.14 u[IU]/mL (ref 0.450–4.500)

## 2016-08-28 MED ORDER — ROSUVASTATIN CALCIUM 10 MG PO TABS: 10.0000 mg | ORAL_TABLET | Freq: Every morning | ORAL | 3 refills | Status: DC

## 2016-09-11 ENCOUNTER — Ambulatory Visit: Payer: 59 | Attending: Family Medicine | Admitting: Audiology

## 2016-09-11 DIAGNOSIS — H903 Sensorineural hearing loss, bilateral: Secondary | ICD-10-CM | POA: Insufficient documentation

## 2016-09-11 DIAGNOSIS — H9319 Tinnitus, unspecified ear: Secondary | ICD-10-CM | POA: Insufficient documentation

## 2016-09-11 DIAGNOSIS — Z01118 Encounter for examination of ears and hearing with other abnormal findings: Secondary | ICD-10-CM | POA: Insufficient documentation

## 2016-09-11 DIAGNOSIS — R94128 Abnormal results of other function studies of ear and other special senses: Secondary | ICD-10-CM | POA: Diagnosis not present

## 2016-09-11 DIAGNOSIS — H93299 Other abnormal auditory perceptions, unspecified ear: Secondary | ICD-10-CM | POA: Insufficient documentation

## 2016-09-11 NOTE — Procedures (Signed)
[facility] [place]  [place], [place] [address]  [phone]   Audiological Evaluation  Patient Name: Charles [name]   Status: Outpatient   DOB: [birth year]    Diagnosis: Hearing Loss           MRN: [id number] Date:  09/11/2016     Referent: Delman Cheadle, MD  History: Charles [name] was seen for an audiological evaluation. Primary Concern: Hearing loss - concerned that can't hear some of the alarms needed for work.  History of hearing problems: Y - gradual for years. History of ear infections:  Y - last one more than 20 years ago. History of dizziness/vertigo:  N History of balance issues:   N Tinnitus: Y - constant humming, ringing. Have had for years. 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 History of hypertension: Borderline - just started medication. History of diabetes:  N Family history of hearing loss: Father had hearing aids. Other concerns:  Had 6 months of chemotherapy for "leukemia".  Finished 4 years ago. Has to turn the TV up loud.  Medications: Cresto, blood pressure medication, medication for plantar fascitis.    Evaluation: Conventional pure tone audiometry from 250Hz  - 8000Hz  with using insert earphones.  Hearing Thresholds are sensorineural bilaterally: Right ear: 15-20 dBHL from 250Hz  - 1000Hz ; 65 dBHL at 2000Hz ; 90dBHL at 4000Hz  and 75 dBHL at 8000Hz .  Left ear:  25-30 dBHL from 250Hz  - 500Hz ; 55 dbHL at 1000Hz ; 80-90 dbL from 2000Hz  - 4000Hz  and 75 dBHL at 8000Hz .  Reliability is good Speech reception levels (repeating words near threshold) using recorded spondee word lists:  Right ear:  Thresholds of 25 dBHL  Left ear:    Thresholds of 40 dBHL Word recognition (at comfortably loud volumes) using recorded NU-6 word lists, in quiet.  Right ear: 90% at 65 dBHL.  Left ear:   25% at 80dbHL with 75 dBHL contralateral masking. Word recognition in minimal  background noise:  +5 dBHL  Right ear: 0%                              Left ear:  DNT  Tympanometry (middle ear function) with elevated to no response ipsilateral acoustic reflexes from 500Hz  - 4000Hz  bilaterally..  Right ear: Normal volume with shallow tympanic membrane compliance (Type As).  Left ear: Larger volume than on the right side with unusual configuration  (Type A??)    CONCLUSION:      Charles [name] has a significant hearing loss bilaterally that will adversely affect speech communication at normal conversational speech levels.  Referral to an ENT and a hearing aid evaluation is recommended.  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  In addition, he may qualify for a hearing aid through a  funded program - [facility] [phone]) would be the contact for that. A third option to help pay for hearing aids may be [employer] [phone]).  Normal has normal to near normal low frequency hearing is each ear that sharply slopes to a borderline severe to profound high frequency sensorineural hearing loss bilaterally - worse on the left side.  Word recognition is excellent  in the right ear at loud conversational speech levels. However in the left ear, even at the most comfortable listening level, word recognition is  very poor.  Testing in minimal background noise was only completed on the right side where it drops from excellent in quiet to no recognized words in minimal background noise.  Great difficulty hearing in minimal background noise is expected.     The test results were discussed and Charles [name] counseled.   RECOMMENDATIONS: 1.  Refer to ENT because of the a) poor word recognition in background noise on the right side b) large middle ear volume on the left side with unusual tympanogram configuration and c) reports of  tinnitus. 2.  A hearing aid evaluation. Note: Charles [name] needs to be able to hear alarms for his work that are currently not audible.   3.  Monitor hearing closely with a repeat audiological evaluation in 3-6 months (earlier if there is any change in hearing or ear pressure).  This evaluation may be completed at the ENT or hearing aid provider office.  4.   Strategies that help improve hearing include: A) Face the speaker directly. Optimal is having the speakers face well - lit.  Unless amplified, being within 3-6 feet of the speaker will enhance word recognition. B) Avoid having the speaker back-lit as this will minimize the ability to use cues from lip-reading, facial expression and gestures. C)  Word recognition is poorer in background noise. For optimal word recognition, turn off the TV, radio or noisy fan when engaging in conversation. In a restaurant, try to sit away from noise sources and close to the primary speaker.  D)  Ask for topic clarification from time to time in order to remain in the conversation.  Most people don't mind repeating or clarifying a point when asked.  If needed, explain the difficulty hearing in background noise or hearing loss. 5.   Use hearing protection during noisy activities such as using a weed eater, moving the lawn, shooting, etc.      Charles [name], Au.D., CCC-A Doctor of Audiology 09/11/2016   cc: Delman Cheadle, MD

## 2016-09-20 DIAGNOSIS — H903 Sensorineural hearing loss, bilateral: Secondary | ICD-10-CM | POA: Diagnosis not present

## 2016-09-20 DIAGNOSIS — H9313 Tinnitus, bilateral: Secondary | ICD-10-CM | POA: Diagnosis not present

## 2016-09-21 ENCOUNTER — Other Ambulatory Visit (HOSPITAL_COMMUNITY): Payer: Self-pay | Admitting: Otolaryngology

## 2016-09-21 DIAGNOSIS — H903 Sensorineural hearing loss, bilateral: Secondary | ICD-10-CM

## 2016-09-24 MED FILL — MELOXICAM 15 MG TABLET: 15 | 60 days supply | Qty: 60 | Fill #3

## 2016-09-25 ENCOUNTER — Ambulatory Visit (HOSPITAL_COMMUNITY)
Admission: RE | Admit: 2016-09-25 | Discharge: 2016-09-25 | Disposition: A | Discharge status: Home / Self Care | Payer: 59 | Source: Ambulatory Visit | Attending: Otolaryngology | Admitting: Otolaryngology

## 2016-09-25 DIAGNOSIS — H9011 Conductive hearing loss, unilateral, right ear, with unrestricted hearing on the contralateral side: Secondary | ICD-10-CM | POA: Diagnosis not present

## 2016-09-25 DIAGNOSIS — H903 Sensorineural hearing loss, bilateral: Secondary | ICD-10-CM | POA: Insufficient documentation

## 2016-09-25 LAB — CREATININE, SERUM
Creatinine, Ser: 1.1 mg/dL (ref 0.61–1.24)
GFR calc Af Amer: >60 mL/min (ref >60)
GFR calc non Af Amer: >60 mL/min (ref >60)

## 2016-09-25 MED ORDER — GADOBENATE DIMEGLUMINE 529 MG/ML IV SOLN
20.0000 mL | Freq: Once | INTRAVENOUS | Status: AC | PRN
  Administered 2016-09-25: 20 mL via INTRAVENOUS

## 2016-10-04 DIAGNOSIS — H903 Sensorineural hearing loss, bilateral: Secondary | ICD-10-CM | POA: Diagnosis not present

## 2016-10-22 ENCOUNTER — Other Ambulatory Visit: Payer: Self-pay | Admitting: Family Medicine

## 2016-10-22 MED FILL — FAMCICLOVIR 500 MG TABLET: 500 | 90 days supply | Qty: 90 | Fill #1

## 2016-10-22 MED FILL — ROSUVASTATIN CALCIUM 10 MG: 10 | 90 days supply | Qty: 90 | Fill #3

## 2016-10-22 MED FILL — ESOMEPRAZOLE MAG DR 40 MG C: 40 | 90 days supply | Qty: 90 | Fill #1

## 2016-10-23 MED FILL — LISINOPRIL-HCTZ 10-12.5 MG: 10-12.5 | 90 days supply | Qty: 90 | Fill #0

## 2016-11-04 NOTE — Progress Notes (Signed)
Subjective:    Patient ID: Charles [name], male    DOB: 1956-04-26, 61 y.o.   MRN: [id number] Chief Complaint  Patient presents with  . Follow-up    blood pressure    HPI  Charles [name] is a delightful 61 yo male here today to follow up on his elevated blood pressure from his CPE 3 mos prior.  Below is also a review of his chronic medical conditions.  HTN: Last visit 3 mos piror we started pt on lisinopril-hctz 10-12.5 Charles [name] has a home cuff that Charles [name] checks periodically and has been about the same as it has here 409 systolic.   HPL: Crestor 10 qd. lipids at goal Jan 2018 with LDL 82, non-hdl 109  Pre-DM: hgba1c 5.8 -> 6.0 07/2016  Obesity: BMP 37.9->39.4->38.8->37.6 ->37.21  CLL:  Chemo in 2014. Follows q49mos with Dr. Burney Gauze for observation as in remission.No live vaccines. On famciclovir daily for prevention of recurrent shingles.  GERD: nexium 40 qd. occ flairs when laying flat.  I had recommended H. Pylori testing to him due to the persistence and severity of sxs but Charles [name] doesn't think Charles [name] can come off of the nexium for long enough to be able to do a breath test. Uses cox-2 rather than nsaids  Bradycardia: sinus at baseline with heart rate in the 50s. No history of problems or symptoms from this  Arthritis: prn meloxicam 15 mg. Charles [name] actually stays on this from Dr. Milinda Pointer for plantar fasccitis.   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 Charles [name] is married and his wife Ginger has had medical problems such as CVA and has undergone open heart surgery.  Past Medical History:  Diagnosis Date  . Allergy   . Arthritis   . CLL (chronic lymphoblastic leukemia) 08/03/2011  . CLL (chronic lymphocytic leukemia) ([place]) 03/18/2012  . GERD (gastroesophageal reflux disease)   . Shingles   . Ulcer     Past Surgical History:  Procedure Laterality Date  . HERNIA REPAIR    . KNEE SURGERY    . SHOULDER SURGERY    . WRIST SURGERY     Current Outpatient Prescriptions on File Prior to Visit  Medication Sig Dispense Refill  . aspirin 81 MG tablet Take 81 mg by mouth daily.    . cetirizine (ZYRTEC) 10 MG tablet Take 10 mg by mouth daily.    . Cholecalciferol (VITAMIN D3) 2000 UNITS TABS Take 2,000 Units by mouth.    . esomeprazole (NEXIUM) 40 MG capsule Take 1 capsule (40 mg total) by mouth daily. 90 capsule 3  . famciclovir (FAMVIR) 500 MG tablet Take 1 tablet (500 mg total) by mouth every morning. 90 tablet 3  . lisinopril-hydrochlorothiazide (PRINZIDE,ZESTORETIC) 10-12.5 MG tablet TAKE 1 TABLET BY MOUTH DAILY. 90 tablet 0  . meloxicam (MOBIC) 15 MG tablet TAKE 1 TABLET BY MOUTH ONCE DAILY 30 tablet 10  . rosuvastatin (CRESTOR) 10 MG tablet Take 1 tablet (10 mg total) by mouth every morning. 90 tablet 3   No current facility-administered medications on file prior to visit.    No Known Allergies Family History  Problem Relation Age of Onset  . Cancer Brother   . Hyperlipidemia Father   . Cancer Brother   . Cancer Sister   . Diabetes Mother  Social History   Social History  . Marital status: Married    Spouse name: N/A  . Number of children: N/A  . Years of education: N/A   Occupational History  . [employer]    Social History Main Topics  . Smoking status: Former Smoker    Packs/day: 2.00    Years: 20.00    Types: Cigarettes    Start date: 08/24/1978    Quit date: 08/24/1998  . Smokeless tobacco: Never Used     Comment: quit smoking 15 years ago  . Alcohol use 0.0 oz/week     Comment: 1 a week  . Drug use: No  . Sexual activity: Yes    Partners: Female    Birth control/ protection: None   Other Topics Concern  . None   Social History Narrative   Married. Education: [employer]. Exercise: Walk    Education: [employer]   Works for [employer] in [place]       Depression screen [facility] 2/9 11/05/2016 08/02/2016 12/31/2015 07/13/2015 07/08/2014  Decreased Interest 0 0 0 0 0  Down, Depressed, Hopeless 0 0 0 0 0  PHQ - 2 Score 0 0 0 0 0    Review of Systems See hpi    Objective:   Physical Exam  Constitutional: Charles [name] is oriented to person, place, and time. Charles [name] appears well-developed and well-nourished. No distress.  HENT:  Head: Normocephalic and atraumatic.  Eyes: Conjunctivae are normal. Pupils are equal, round, and reactive to light. No scleral icterus.  Neck: Normal range of motion. Neck supple. No thyromegaly present.  Cardiovascular: Regular rhythm, normal heart sounds and intact distal pulses.  Bradycardia present.   Pulmonary/Chest: Effort normal and breath sounds normal. No respiratory distress.  Musculoskeletal: Charles [name] exhibits no edema.  Lymphadenopathy:    Charles [name] has no cervical adenopathy.  Neurological: Charles [name] is alert and oriented to person, place, and time.  Skin: Skin is warm and dry. Charles [name] is not diaphoretic.  Psychiatric: Charles [name] has a normal mood and affect. His behavior is normal.      BP 137/79   Pulse (!) 50   Temp 97.8 F (36.6 C) (Oral)   Resp 18   Ht 5\' 9"  (1.753 m)   Wt 252 lb (114.3 kg)   SpO2 96%   BMI 37.21 kg/m     EKG: Sinus bradycardia Assessment & Plan:  Charles [name] does need prevnar-13 and then pneumovax at least 2 mos later due to CLL- will ask pt to RTC for nurse only visit for this at his convenience or f/u OV in sev mos to recheck BP. No live vaccines so no zostavax. Bmp Cons GI referral for persistent GERD Call in 30d to let us know how blood pressure are so we can decide to leave him on 20-25 or decrease to 20-12.5.  1. Essential hypertension, benign   2. Medication monitoring encounter   3. Need for prophylactic vaccination against Streptococcus pneumoniae (pneumococcus)   4. Immunocompromised ([place])   5. CLL (chronic lymphocytic leukemia) ([place])   6. Drug-induced erectile dysfunction   7. Sinus bradycardia      Orders Placed This Encounter  Procedures  . Pneumococcal conjugate vaccine 13-valent IM  . Basic metabolic panel    Order Specific Question:   Has the patient fasted?    Answer:   No  . Care order/instruction:    Scheduling Instructions:     Complete orders, AVS and go.  . EKG 12-Lead    Meds ordered this encounter  Medications  . sildenafil (VIAGRA) 100 MG tablet    Sig: Take 0.5 tablets (50 mg total) by mouth daily as needed for erectile dysfunction.    Dispense:  10 tablet    Refill:  11    Delman Cheadle, M.D.  Primary Care at [facility] [address] [place], [place] [address] [phone] phone [phone] fax  11/21/16 11:01 PM

## 2016-11-05 ENCOUNTER — Encounter: Payer: Self-pay | Admitting: Family Medicine

## 2016-11-05 ENCOUNTER — Ambulatory Visit (INDEPENDENT_AMBULATORY_CARE_PROVIDER_SITE_OTHER): Payer: 59 | Admitting: Family Medicine

## 2016-11-05 VITALS — BP 137/79 | HR 50 | Temp 97.8°F | Resp 18 | Ht 69.0 in | Wt 252.0 lb

## 2016-11-05 DIAGNOSIS — Z5181 Encounter for therapeutic drug level monitoring: Secondary | ICD-10-CM

## 2016-11-05 DIAGNOSIS — Z23 Encounter for immunization: Secondary | ICD-10-CM | POA: Diagnosis not present

## 2016-11-05 DIAGNOSIS — R001 Bradycardia, unspecified: Secondary | ICD-10-CM

## 2016-11-05 DIAGNOSIS — D849 Immunodeficiency, unspecified: Secondary | ICD-10-CM | POA: Diagnosis not present

## 2016-11-05 DIAGNOSIS — I10 Essential (primary) hypertension: Secondary | ICD-10-CM

## 2016-11-05 DIAGNOSIS — C9190 Lymphoid leukemia, unspecified not having achieved remission: Secondary | ICD-10-CM | POA: Diagnosis not present

## 2016-11-05 DIAGNOSIS — N522 Drug-induced erectile dysfunction: Secondary | ICD-10-CM | POA: Diagnosis not present

## 2016-11-05 DIAGNOSIS — C9110 Chronic lymphocytic leukemia of B-cell type not having achieved remission: Secondary | ICD-10-CM

## 2016-11-05 DIAGNOSIS — D899 Disorder involving the immune mechanism, unspecified: Secondary | ICD-10-CM

## 2016-11-05 MED ORDER — SILDENAFIL CITRATE 100 MG PO TABS: 50.0000 mg | ORAL_TABLET | Freq: Every day | ORAL | 11 refills | Status: DC | PRN

## 2016-11-05 MED FILL — SILDENAFIL 100 MG TABLET: 100 | 30 days supply | Qty: 6 | Fill #0

## 2016-11-05 NOTE — Patient Instructions (Addendum)
Please remember to send me a my chart email in about one month to let me know what your blood pressures have been running now that you are taking 2 tabs a day of your blood pressure medication. Let me know if you have any side effects. If you do not hear back from me within 7-10 days through the my chart please call.    IF you received an x-ray today, you will receive an invoice from [facility]. Please contact [facility] at [phone] with questions or concerns regarding your invoice.   IF you received labwork today, you will receive an invoice from [facility]. Please contact [facility] at [phone] with questions or concerns regarding your invoice.   Our billing staff will not be able to assist you with questions regarding bills from these companies.  You will be contacted with the lab results as soon as they are available. The fastest way to get your results is to activate your My Chart account. Instructions are located on the last page of this paperwork. If you have not heard from [place] regarding the results in 2 weeks, please contact this office.

## 2016-11-06 LAB — BASIC METABOLIC PANEL
BUN/Creatinine Ratio: 14 (ref 10–24)
BUN: 15 mg/dL (ref 8–27)
CO2: 25 mmol/L (ref 18–29)
Calcium: 9.1 mg/dL (ref 8.6–10.2)
Chloride: 100 mmol/L (ref 96–106)
Creatinine, Ser: 1.06 mg/dL (ref 0.76–1.27)
GFR calc Af Amer: 87 mL/min/{1.73_m2} (ref >59)
GFR calc non Af Amer: 75 mL/min/{1.73_m2} (ref >59)
Glucose: 108 mg/dL — ABNORMAL HIGH (ref 65–99)
Potassium: 4.2 mmol/L (ref 3.5–5.2)
Sodium: 142 mmol/L (ref 134–144)

## 2016-11-19 ENCOUNTER — Telehealth: Payer: Self-pay | Admitting: *Deleted

## 2016-11-19 NOTE — Telephone Encounter (Signed)
Fax request for Meloxicam, denied pt not seen since 12/2014. Return faxed.

## 2016-11-22 ENCOUNTER — Other Ambulatory Visit: Payer: Self-pay | Admitting: Podiatry

## 2016-12-18 ENCOUNTER — Encounter: Payer: Self-pay | Admitting: Family Medicine

## 2016-12-18 MED ORDER — LISINOPRIL-HYDROCHLOROTHIAZIDE 20-25 MG PO TABS: 1.0000 | ORAL_TABLET | Freq: Every day | ORAL | 1 refills | Status: DC

## 2016-12-18 MED FILL — LISINOPRIL-HCTZ 20-25 MG TA: 20-25 | 90 days supply | Qty: 90 | Fill #0

## 2016-12-27 ENCOUNTER — Ambulatory Visit: Payer: 59 | Admitting: Hematology & Oncology

## 2016-12-27 ENCOUNTER — Other Ambulatory Visit: Payer: 59

## 2017-01-03 ENCOUNTER — Other Ambulatory Visit: Payer: 59

## 2017-01-03 ENCOUNTER — Ambulatory Visit: Payer: 59 | Admitting: Hematology & Oncology

## 2017-01-07 ENCOUNTER — Ambulatory Visit: Payer: 59 | Admitting: Hematology & Oncology

## 2017-01-07 ENCOUNTER — Other Ambulatory Visit: Payer: 59

## 2017-01-22 MED FILL — FAMCICLOVIR 500 MG TABLET: 500 | 90 days supply | Qty: 90 | Fill #2

## 2017-01-22 MED FILL — ROSUVASTATIN CALCIUM 10 MG: 10 | 90 days supply | Qty: 90 | Fill #0

## 2017-01-22 MED FILL — ESOMEPRAZOLE MAG DR 40 MG C: 40 | 90 days supply | Qty: 90 | Fill #2

## 2017-02-05 ENCOUNTER — Ambulatory Visit (HOSPITAL_BASED_OUTPATIENT_CLINIC_OR_DEPARTMENT_OTHER): Payer: 59 | Admitting: Hematology & Oncology

## 2017-02-05 ENCOUNTER — Other Ambulatory Visit (HOSPITAL_BASED_OUTPATIENT_CLINIC_OR_DEPARTMENT_OTHER): Payer: 59

## 2017-02-05 VITALS — BP 125/69 | HR 56 | Temp 98.2°F | Resp 16 | Wt 247.0 lb

## 2017-02-05 DIAGNOSIS — C9110 Chronic lymphocytic leukemia of B-cell type not having achieved remission: Secondary | ICD-10-CM

## 2017-02-05 DIAGNOSIS — C9111 Chronic lymphocytic leukemia of B-cell type in remission: Secondary | ICD-10-CM

## 2017-02-05 LAB — CBC WITH DIFFERENTIAL (CANCER CENTER ONLY)
BASO#: 0.1 10*3/uL (ref 0.0–0.2)
BASO%: 0.7 % (ref 0.0–2.0)
EOS%: 2 % (ref 0.0–7.0)
Eosinophils Absolute: 0.2 10*3/uL (ref 0.0–0.5)
HCT: 42.8 % (ref 38.7–49.9)
HGB: 15 g/dL (ref 13.0–17.1)
LYMPH#: 2.6 10*3/uL (ref 0.9–3.3)
LYMPH%: 29.5 % (ref 14.0–48.0)
MCH: 33.8 pg — ABNORMAL HIGH (ref 28.0–33.4)
MCHC: 35 g/dL (ref 32.0–35.9)
MCV: 96 fL (ref 82–98)
MONO#: 0.8 10*3/uL (ref 0.1–0.9)
MONO%: 9 % (ref 0.0–13.0)
NEUT#: 5.2 10*3/uL (ref 1.5–6.5)
NEUT%: 58.8 % (ref 40.0–80.0)
Platelets: 185 10*3/uL (ref 145–400)
RBC: 4.44 10*6/uL (ref 4.20–5.70)
RDW: 12.4 % (ref 11.1–15.7)
WBC: 8.8 10*3/uL (ref 4.0–10.0)

## 2017-02-05 LAB — CHCC SATELLITE - SMEAR

## 2017-02-05 NOTE — Progress Notes (Signed)
Hematology and Oncology Follow Up Visit  DEROLD DORSCH [id number] 06-24-1956 61 y.o. 06/26/2016   Principle Diagnosis:  Chronic lymphocytic leukemia (Trisomy 12) - stage C - remission.  Current Therapy:    Observation     Interim History:  Mr.  Patti is back for followup. He's doing quite well. I run into him at the hospital every now and then. 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  His wife is slowly getting better. She still has a few neurological issues. She had meningitis, CVA and had a heart attack. All this was within 2 or 3 months back in 2016.  Thankfully, she is improving. She is still getting some rehabilitation.  He has been feeling quite well. He is working all the time. He's had no problems with fever. He's had no infections. He's had no lymph nodes. He's had no change in bowel or bladder habits. He's had no cough.  Overall, his performance status is ECOG 0.   Medications:  Current Outpatient Prescriptions:  .  aspirin 81 MG tablet, Take 81 mg by mouth daily., Disp: , Rfl:  .  cetirizine (ZYRTEC) 10 MG tablet, Take 10 mg by mouth daily., Disp: , Rfl:  .  Cholecalciferol (VITAMIN D3) 2000 UNITS TABS, Take 2,000 Units by mouth., Disp: , Rfl:  .  esomeprazole (NEXIUM) 40 MG capsule, TAKE 1 CAPSULE BY MOUTH DAILY., Disp: 90 capsule, Rfl: 1 .  famciclovir (FAMVIR) 500 MG tablet, Take 1 tablet (500 mg total) by mouth every morning., Disp: 90 tablet, Rfl: 3 .  meloxicam (MOBIC) 15 MG tablet, TAKE 1 TABLET BY MOUTH ONCE DAILY, Disp: 30 tablet, Rfl: 10 .  olopatadine (PATANOL) 0.1 % ophthalmic solution, Place 1 drop into both eyes 2 (two) times daily., Disp: 5 mL, Rfl: 0 .  rosuvastatin (CRESTOR) 10 MG tablet, TAKE 1 TABLET BY MOUTH EVERY MORNING., Disp: 90 tablet, Rfl: 3  Allergies: No Known Allergies  Past Medical History, Surgical history, Social history, and Family History were reviewed and updated.  Review of Systems: As  above  Physical Exam:  weight is 257 lb 6.4 oz (116.8 kg). His oral temperature is 98 F (36.7 C). His blood pressure is 175/95 (abnormal) and his pulse is 56 (abnormal).   Well-developed and well-nourished white male. Head and neck exam shows no ocular or oral lesions. There is no adenopathy in the neck. Lungs are clear. Cardiac exam regular rate and rhythm with no murmurs, rubs or bruits. Abdomen is soft. He has good bowel sounds. There is no fluid wave. There is a palpable liver edge. There is a palpable splenomegaly. Back exam no tenderness over the spine ribs or hips. Extremities shows no clubbing cyanosis or edema. Neurological exam shows no focal neurological deficits. Skin exam no rashes ecchymoses or petechia. Axillary exam shows no bilateral axillary adenopathy.  Lab Results  Component Value Date   WBC 6.9 06/26/2016   HGB 15.9 06/26/2016   HCT 45.0 06/26/2016   MCV 93 06/26/2016   PLT 187 06/26/2016     Chemistry      Component Value Date/Time   NA 142 12/27/2015 1046   K 4.1 12/27/2015 1046   CL 102 12/22/2014 0925   CL 102 04/10/2012 0815   CO2 28 12/27/2015 1046   BUN 14.0 12/27/2015 1046   CREATININE 1.1 12/27/2015 1046      Component Value Date/Time   CALCIUM 9.3 12/27/2015 1046   ALKPHOS 64 12/27/2015 1046  AST 19 12/27/2015 1046   ALT 20 12/27/2015 1046   BILITOT 0.56 12/27/2015 1046         Impression and Plan: Mr. Liford is 61 year old male with CLL. He had good chromosomes. He had a trisomy 12 chromosome abnormality.  He finished chemotherapy with Rituxan/Treanda back in February 2014. He had 6 cycles.  I do not see any evidence of recurrent disease.  I will plan to see him back in 1 year.Given everything looks so stable, I think we can get him back in one year. I would has blood under the microscope. I did not see anything that looked suspicious.  He knows that he can was give [place] a call back if there are any problems before we see him in one  year.  12/5/201711:39 AM

## 2017-03-21 ENCOUNTER — Ambulatory Visit (INDEPENDENT_AMBULATORY_CARE_PROVIDER_SITE_OTHER): Payer: 59

## 2017-03-21 ENCOUNTER — Ambulatory Visit (INDEPENDENT_AMBULATORY_CARE_PROVIDER_SITE_OTHER): Payer: 59 | Admitting: Podiatry

## 2017-03-21 DIAGNOSIS — M722 Plantar fascial fibromatosis: Secondary | ICD-10-CM

## 2017-03-21 MED ORDER — MELOXICAM 15 MG PO TABS: 15.0000 mg | ORAL_TABLET | Freq: Every day | ORAL | 10 refills | Status: DC

## 2017-03-21 MED FILL — MELOXICAM 15 MG TABLET: 15 | 30 days supply | Qty: 30 | Fill #0

## 2017-03-21 NOTE — Progress Notes (Signed)
He presents today with a recurrence of fasciitis to his right foot. He states on a filament orthotics are starting to wear out and I have run out of meloxicam so this point I need both.  Objective: Vital signs are stable he is alert and oriented 3 ulcers are palpable. He has pain on palpation medial calcaneal tubercle of his right heel.  Assessment: Recurrent plantar fasciitis right foot.  Plan: We reinjected his right heel today with Kenalog and local anesthetic started him back on his meloxicam. He was then scanned and/or casted for orthotics.

## 2017-03-26 MED FILL — LISINOPRIL-HCTZ 20-25 MG TA: 20-25 | 90 days supply | Qty: 90 | Fill #1

## 2017-04-22 MED FILL — MELOXICAM 15 MG TABLET: 15 | 90 days supply | Qty: 90 | Fill #1

## 2017-05-01 MED FILL — ESOMEPRAZOLE MAG DR 40 MG C: 40 | 90 days supply | Qty: 90 | Fill #3

## 2017-05-01 MED FILL — ROSUVASTATIN CALCIUM 10 MG: 10 | 90 days supply | Qty: 90 | Fill #1

## 2017-05-01 MED FILL — FAMCICLOVIR 500 MG TABLET: 500 | 90 days supply | Qty: 90 | Fill #3

## 2017-06-24 ENCOUNTER — Other Ambulatory Visit: Payer: Self-pay | Admitting: Family Medicine

## 2017-06-24 MED FILL — LISINOPRIL-HCTZ 20-25 MG TA: 20-25 | 30 days supply | Qty: 30 | Fill #0

## 2017-07-22 ENCOUNTER — Other Ambulatory Visit: Payer: Self-pay | Admitting: Family Medicine

## 2017-07-22 MED FILL — FAMCICLOVIR 500 MG TABLET: 500 | 90 days supply | Qty: 90 | Fill #0

## 2017-07-22 MED FILL — LISINOPRIL-HCTZ 20-25 MG TA: 20-25 | 30 days supply | Qty: 30 | Fill #0

## 2017-07-22 MED FILL — ROSUVASTATIN CALCIUM 10 MG: 10 | 90 days supply | Qty: 90 | Fill #2

## 2017-07-22 MED FILL — MELOXICAM 15 MG TABLET: 15 | 90 days supply | Qty: 90 | Fill #2

## 2017-07-29 ENCOUNTER — Other Ambulatory Visit: Payer: Self-pay | Admitting: Family Medicine

## 2017-07-30 MED FILL — ESOMEPRAZOLE MAG DR 40 MG C: 40 | 90 days supply | Qty: 90 | Fill #0

## 2017-08-05 ENCOUNTER — Ambulatory Visit (INDEPENDENT_AMBULATORY_CARE_PROVIDER_SITE_OTHER): Payer: 59 | Admitting: Family Medicine

## 2017-08-05 ENCOUNTER — Other Ambulatory Visit: Payer: Self-pay

## 2017-08-05 ENCOUNTER — Ambulatory Visit (INDEPENDENT_AMBULATORY_CARE_PROVIDER_SITE_OTHER): Payer: 59

## 2017-08-05 ENCOUNTER — Encounter: Payer: Self-pay | Admitting: Family Medicine

## 2017-08-05 VITALS — BP 110/72 | HR 58 | Temp 97.5°F | Resp 16 | Ht 69.0 in | Wt 250.0 lb

## 2017-08-05 DIAGNOSIS — Z136 Encounter for screening for cardiovascular disorders: Secondary | ICD-10-CM

## 2017-08-05 DIAGNOSIS — Z125 Encounter for screening for malignant neoplasm of prostate: Secondary | ICD-10-CM

## 2017-08-05 DIAGNOSIS — E7800 Pure hypercholesterolemia, unspecified: Secondary | ICD-10-CM

## 2017-08-05 DIAGNOSIS — G5793 Unspecified mononeuropathy of bilateral lower limbs: Secondary | ICD-10-CM

## 2017-08-05 DIAGNOSIS — M5442 Lumbago with sciatica, left side: Secondary | ICD-10-CM | POA: Diagnosis not present

## 2017-08-05 DIAGNOSIS — Z0000 Encounter for general adult medical examination without abnormal findings: Secondary | ICD-10-CM

## 2017-08-05 DIAGNOSIS — K219 Gastro-esophageal reflux disease without esophagitis: Secondary | ICD-10-CM

## 2017-08-05 DIAGNOSIS — C9110 Chronic lymphocytic leukemia of B-cell type not having achieved remission: Secondary | ICD-10-CM

## 2017-08-05 DIAGNOSIS — G8929 Other chronic pain: Secondary | ICD-10-CM

## 2017-08-05 DIAGNOSIS — R7303 Prediabetes: Secondary | ICD-10-CM

## 2017-08-05 DIAGNOSIS — K439 Ventral hernia without obstruction or gangrene: Secondary | ICD-10-CM | POA: Diagnosis not present

## 2017-08-05 DIAGNOSIS — I10 Essential (primary) hypertension: Secondary | ICD-10-CM | POA: Diagnosis not present

## 2017-08-05 DIAGNOSIS — M545 Low back pain: Secondary | ICD-10-CM | POA: Diagnosis not present

## 2017-08-05 DIAGNOSIS — Z1383 Encounter for screening for respiratory disorder NEC: Secondary | ICD-10-CM

## 2017-08-05 DIAGNOSIS — Z5181 Encounter for therapeutic drug level monitoring: Secondary | ICD-10-CM | POA: Diagnosis not present

## 2017-08-05 DIAGNOSIS — R1901 Right upper quadrant abdominal swelling, mass and lump: Secondary | ICD-10-CM

## 2017-08-05 DIAGNOSIS — C9190 Lymphoid leukemia, unspecified not having achieved remission: Secondary | ICD-10-CM

## 2017-08-05 DIAGNOSIS — Z1389 Encounter for screening for other disorder: Secondary | ICD-10-CM

## 2017-08-05 DIAGNOSIS — Z6837 Body mass index (BMI) 37.0-37.9, adult: Secondary | ICD-10-CM

## 2017-08-05 LAB — POCT URINALYSIS DIP (MANUAL ENTRY)
Bilirubin, UA: NEGATIVE
Blood, UA: NEGATIVE
Glucose, UA: NEGATIVE mg/dL
Ketones, POC UA: NEGATIVE mg/dL
Leukocytes, UA: NEGATIVE
Nitrite, UA: NEGATIVE
Protein Ur, POC: NEGATIVE mg/dL
Spec Grav, UA: 1.015 (ref 1.010–1.025)
Urobilinogen, UA: 0.2 E.U./dL
pH, UA: 6 (ref 5.0–8.0)

## 2017-08-05 MED ORDER — ROSUVASTATIN CALCIUM 10 MG PO TABS: 10.0000 mg | ORAL_TABLET | Freq: Every morning | ORAL | 3 refills | Status: DC

## 2017-08-05 MED ORDER — LISINOPRIL-HYDROCHLOROTHIAZIDE 20-25 MG PO TABS: 1.0000 | ORAL_TABLET | Freq: Every day | ORAL | 3 refills | Status: DC

## 2017-08-05 MED ORDER — ESOMEPRAZOLE MAGNESIUM 40 MG PO CPDR: 40.0000 mg | DELAYED_RELEASE_CAPSULE | Freq: Every day | ORAL | 3 refills | Status: DC

## 2017-08-05 MED ORDER — FAMCICLOVIR 500 MG PO TABS: 500.0000 mg | ORAL_TABLET | Freq: Every morning | ORAL | 3 refills | Status: DC

## 2017-08-05 MED ORDER — ZOSTER VAC RECOMB ADJUVANTED 50 MCG/0.5ML IM SUSR: 0.5000 mL | Freq: Once | INTRAMUSCULAR | 1 refills | Status: AC

## 2017-08-05 NOTE — Progress Notes (Signed)
Subjective:  By signing my name below, I, Charles [name], attest that this documentation has been prepared under the direction and in the presence of Delman Cheadle, MD. Electronically Signed: Moises [name], [place]. 08/05/2017 , 9:44 AM .  Patient was seen in Room 3 .   Patient ID: Charles [name], male    DOB: [birth year], 62 y.o.   MRN: [id number] Chief Complaint  Patient presents with  . Annual Exam   HPI  Charles [name] is a 62 y.o. male presents to Primary Care at [facility] today for an annual physical.   Primary Preventative Screenings: Prostate Cancer: 2 brothers with history of prostate cancer; 63 years old and 63 years old Weight/[name] sugar/Diet/Exercise: BMI Readings from Last 3 Encounters:  08/05/17 36.92 kg/m  02/05/17 36.48 kg/m  11/05/16 37.21 kg/m   Lab Results  Component Value Date   HGBA1C 6.0 (H) 08/02/2016   Immunizations:  Immunization History  Administered Date(s) Administered  . Hepatitis B 07/23/1984  . Influenza Split 04/10/2012, 08/01/2013, 04/27/2015  . Influenza-Unspecified 04/22/2016, 04/15/2017  . Pneumococcal Conjugate-13 11/05/2016  . Tdap 04/22/2012    Chronic Medical Conditions: HTN: He checks his BP at home, BP running regularly. He denies complications with medications.  GERD: Even after taking Prilosec, would wake him up in the middle of the night, and would have to sit up for relief.  Bone Spur: He takes Meloxicam, received by Dr. Milinda Pointer.   ROS complaint Numbness in feet: He states bilateral numbness in his feet occurs any time, with varying duration; generally improves with elevation. He describes sensation "feels like it falling asleep", and occurs mostly with left sciatic nerve pain with some low back pain. He also mentions back of his legs ache and can't stand up straight in the mornings. He takes tylenol and advil every now and then, about once or twice a week. He notes feet giving way periodically and randomly.   Past Medical  History:  Diagnosis Date  . Allergy   . Arthritis   . CLL (chronic lymphoblastic leukemia) 08/03/2011  . CLL (chronic lymphocytic leukemia) ([place]) 03/18/2012  . GERD (gastroesophageal reflux disease)   . Shingles   . Ulcer    Past Surgical History:  Procedure Laterality Date  . HERNIA REPAIR    . KNEE SURGERY    . SHOULDER SURGERY    . WRIST SURGERY     Prior to Admission medications   Medication Sig Start Date End Date Taking? Authorizing Provider  aspirin 81 MG tablet Take 81 mg by mouth daily.   Yes [provider]  cetirizine (ZYRTEC) 10 MG tablet Take 10 mg by mouth daily.   Yes [provider]  Cholecalciferol (VITAMIN D3) 2000 UNITS TABS Take 2,000 Units by mouth.   Yes [provider]  esomeprazole (NEXIUM) 40 MG capsule TAKE 1 CAPSULE (40 MG TOTAL) BY MOUTH DAILY. 07/30/17  Yes Shawnee Knapp, MD  famciclovir (FAMVIR) 500 MG tablet TAKE 1 TABLET (500 MG TOTAL) BY MOUTH EVERY MORNING. 07/22/17  Yes Shawnee Knapp, MD  lisinopril-hydrochlorothiazide (PRINZIDE,ZESTORETIC) 20-25 MG tablet TAKE 1 TABLET BY MOUTH DAILY. OFFICE VISIT NEEDED FOR 40 DAY SUPPLY 07/22/17  Yes Shawnee Knapp, MD  meloxicam (MOBIC) 15 MG tablet Take 1 tablet (15 mg total) by mouth daily. 03/21/17  Yes Hyatt, Max T, DPM  rosuvastatin (CRESTOR) 10 MG tablet Take 1 tablet (10 mg total) by mouth every morning. 08/28/16  Yes Shawnee Knapp, MD  sildenafil (VIAGRA) 100 MG  tablet Take 0.5 tablets (50 mg total) by mouth daily as needed for erectile dysfunction. 11/05/16  Yes Shawnee Knapp, MD  Zoster Vaccine Adjuvanted [facility]) injection Inject 0.5 mLs into the muscle once for 1 dose. Repeat in 2 to 6 months 08/05/17 08/05/17 Yes Shawnee Knapp, MD   No Known Allergies Family History  Problem Relation Age of Onset  . Cancer Brother   . Hyperlipidemia Father   . Cancer Brother   . Cancer Sister   . Diabetes Mother    Social History   Socioeconomic History  . Marital status: Married    Spouse name: None   . Number of children: None  . Years of education: None  . Highest education level: None  Social Needs  . Financial resource strain: None  . Food insecurity - worry: None  . Food insecurity - inability: None  . Transportation needs - medical: None  . Transportation needs - non-medical: None  Occupational History  . Occupation: [employer]  Tobacco Use  . Smoking status: Former Smoker    Packs/day: 2.00    Years: 20.00    Pack years: 40.00    Types: Cigarettes    Start date: 08/24/1978    Last attempt to quit: 08/24/1998    Years since quitting: 18.9  . Smokeless tobacco: Never Used  . Tobacco comment: quit smoking 15 years ago  Substance and Sexual Activity  . Alcohol use: Yes    Alcohol/week: 0.0 oz    Comment: 1 a week  . Drug use: No  . Sexual activity: Yes    Partners: Female    Birth control/protection: None  Other Topics Concern  . None  Social History Narrative   Married. Education: [employer]. Exercise: Walk    Education: [employer]   Works for [employer] in [place]   Depression screen [facility] 2/9 11/05/2016 08/02/2016 12/31/2015 07/13/2015 07/08/2014  Decreased Interest 0 0 0 0 0  Down, Depressed, Hopeless 0 0 0 0 0  PHQ - 2 Score 0 0 0 0 0    Review of Systems  HENT: Positive for hearing loss.   Musculoskeletal: Positive for myalgias.  Allergic/Immunologic: Positive for environmental allergies.  All other systems reviewed and are negative.      Objective:   Physical Exam  Constitutional: He is oriented to person, place, and time. He appears well-developed and well-nourished. No distress.  HENT:  Head: Normocephalic and atraumatic.  Right Ear: Tympanic membrane is erythematous.  Left Ear: Tympanic membrane is erythematous.  Nose: Nose normal.  Mouth/Throat: Posterior oropharyngeal erythema present.  Ears: Bilateral TM opacification Post oropharynx: postnasal drip  Eyes: EOM are normal. Pupils are equal, round, and reactive to light.  Neck: Neck supple. No  thyromegaly present.  Cardiovascular: Normal rate, regular rhythm, S1 normal, S2 normal and normal heart sounds.  No murmur heard. Pulmonary/Chest: Effort normal and breath sounds normal. No respiratory distress.  Abdominal: Soft. Bowel sounds are normal. He exhibits distension (mild).  Diastasis recti and possible RUQ ventral wall  Genitourinary: Prostate normal. Prostate is not enlarged and not tender.  Musculoskeletal: Normal range of motion.  Lymphadenopathy:    He has no cervical adenopathy.  Neurological: He is alert and oriented to person, place, and time.  Reflex Scores:      Patellar reflexes are 2+ on the right side and 1+ on the left side.      Achilles reflexes are 2+ on the right side and 1+ on the left side. Negative seated  straight leg raise bilaterally  Skin: Skin is warm and dry.  Psychiatric: He has a normal mood and affect. His behavior is normal.  Nursing note and vitals reviewed.   BP 110/72   Pulse (!) 58   Temp (!) 97.5 F (36.4 C)   Resp 16   Ht 5\' 9"  (1.753 m)   Wt 250 lb (113.4 kg)   SpO2 96%   BMI 36.92 kg/m   Dg Lumbar Spine 2-3 Views  Result Date: 08/05/2017 CLINICAL DATA:  Chronic bilateral low back pain radiating into the left leg. No known injury. EXAM: LUMBAR SPINE - 2-3 VIEW COMPARISON:  CT abdomen and pelvis 10/08/2012. FINDINGS: There is no fracture or malalignment. Intervertebral disc space height is maintained. Minimal anterior endplate spurring off the superior endplates of L3 on L4 is identified. There is some facet arthropathy at L5-S1. Aortic atherosclerosis is identified. IMPRESSION: No acute finding. Mild appearing lumbar spondylosis. Atherosclerosis. Electronically Signed   By: Inge Rise M.D.   On: 08/05/2017 09:51       Assessment & Plan:   1. Annual physical exam   2. Screening for cardiovascular, respiratory, and genitourinary diseases   3. Screening for prostate cancer   4. Essential hypertension, benign   5.  Gastroesophageal reflux disease without esophagitis   6. Pure hypercholesterolemia   7. Pre-diabetes   8. Class 2 severe obesity due to excess calories with serious comorbidity and body mass index (BMI) of 37.0 to 37.9 in adult ([place])   9. CLL (chronic lymphocytic leukemia) ([place])   10. Medication monitoring encounter   11. Neuropathy of both feet   12. Chronic bilateral low back pain with left-sided sciatica - try PT, xray nml  13. RUQ abdominal mass - poss stool? Feels mobile, r/o ventral wall hernia or hepatomegaly w/ [place]  14. Hernia of abdominal wall - diastasis recti    Orders Placed This Encounter  Procedures  . DG Lumbar Spine 2-3 Views    Standing Status:   Future    Number of Occurrences:   1    Standing Expiration Date:   08/05/2018    Order Specific Question:   Reason for Exam (SYMPTOM  OR DIAGNOSIS REQUIRED)    Answer:   WORSENING BILATERAL LOW BACK PAIN WITH LEFT RADICULOPATHY AND BILATERAL DISTAL NEUROPATHY    Order Specific Question:   Preferred imaging location?    Answer:   External  . US Abdomen Limited RUQ    Standing Status:   Future    Standing Expiration Date:   10/04/2018    Order Specific Question:   Reason for Exam (SYMPTOM  OR DIAGNOSIS REQUIRED)    Answer:   RUQ mass on exam - ddx hepatomegaly vs ventral wall RUQ hernia    Order Specific Question:   Preferred imaging location?    Answer:   [facility]  . Lipid panel    Order Specific Question:   Has the patient fasted?    Answer:   Yes  . Comprehensive metabolic panel    Order Specific Question:   Has the patient fasted?    Answer:   Yes  . CBC with Differential/Platelet  . TSH  . PSA  . Hemoglobin A1c  . Vitamin B12  . RPR  . RPR  . Vitamin B12  . Ambulatory referral to Gastroenterology    Referral Priority:   Routine    Referral Type:   Consultation    Referral Reason:   Specialty Services Required  Number of Visits Requested:   1  . Ambulatory referral to Physical Therapy    Referral  Priority:   Routine    Referral Type:   Physical Medicine    Referral Reason:   Specialty Services Required    Requested Specialty:   Physical Therapy    Number of Visits Requested:   1  . POCT urinalysis dipstick    Meds ordered this encounter  Medications  . Zoster Vaccine Adjuvanted [facility]) injection    Sig: Inject 0.5 mLs into the muscle once for 1 dose. Repeat in 2 to 6 months    Dispense:  0.5 mL    Refill:  1  . esomeprazole (NEXIUM) 40 MG capsule    Sig: Take 1 capsule (40 mg total) by mouth daily. Before bed    Dispense:  90 capsule    Refill:  3  . famciclovir (FAMVIR) 500 MG tablet    Sig: Take 1 tablet (500 mg total) by mouth every morning.    Dispense:  90 tablet    Refill:  3  . lisinopril-hydrochlorothiazide (PRINZIDE,ZESTORETIC) 20-25 MG tablet    Sig: Take 1 tablet by mouth daily.    Dispense:  90 tablet    Refill:  3  . rosuvastatin (CRESTOR) 10 MG tablet    Sig: Take 1 tablet (10 mg total) by mouth every morning.    Dispense:  90 tablet    Refill:  3    I personally performed the services described in this documentation, which was scribed in my presence. The recorded information has been reviewed and considered, and addended by me as needed.   Delman Cheadle, M.D.  Primary Care at [facility] [address] [place], [place] [address] [phone] phone [phone] fax  08/07/17 10:02 PM

## 2017-08-05 NOTE — Patient Instructions (Signed)
Peripheral Neuropathy Peripheral neuropathy is a type of nerve damage. It affects nerves that carry signals between the spinal cord and other parts of the body. These are called peripheral nerves. With peripheral neuropathy, one nerve or a group of nerves may be damaged. What are the causes? Many things can damage peripheral nerves. For some people with peripheral neuropathy, the cause is unknown. Some causes include:  Diabetes. This is the most common cause of peripheral neuropathy.  Injury to a nerve.  Pressure or stress on a nerve that lasts a long time.  Too little vitamin B. Alcoholism can lead to this.  Infections.  Autoimmune diseases, such as multiple sclerosis and systemic lupus erythematosus.  Inherited nerve diseases.  Some medicines, such as cancer drugs.  Toxic substances, such as lead and mercury.  Too little blood flowing to the legs.  Kidney disease.  Thyroid disease.  What are the signs or symptoms? Different people have different symptoms. The symptoms you have will depend on which of your nerves is damaged. Common symptoms include:  Loss of feeling (numbness) in the feet and hands.  Tingling in the feet and hands.  Pain that burns.  Very sensitive skin.  Weakness.  Not being able to move a part of the body (paralysis).  Muscle twitching.  Clumsiness or poor coordination.  Loss of balance.  Not being able to control your bladder.  Feeling dizzy.  Sexual problems.  How is this diagnosed? Peripheral neuropathy is a symptom, not a disease. Finding the cause of peripheral neuropathy can be hard. To figure that out, your health care provider will take a medical history and do a physical exam. A neurological exam will also be done. This involves checking things affected by your brain, spinal cord, and nerves (nervous system). For example, your health care provider will check your reflexes, how you move, and what you can feel. Other types of tests  may also be ordered, such as:  Blood tests.  A test of the fluid in your spinal cord.  Imaging tests, such as CT scans or an MRI.  Electromyography (EMG). This test checks the nerves that control muscles.  Nerve conduction velocity tests. These tests check how fast messages pass through your nerves.  Nerve biopsy. A small piece of nerve is removed. It is then checked under a microscope.  How is this treated?  Medicine is often used to treat peripheral neuropathy. Medicines may include: ? Pain-relieving medicines. Prescription or over-the-counter medicine may be suggested. ? Antiseizure medicine. This may be used for pain. ? Antidepressants. These also may help ease pain from neuropathy. ? Lidocaine. This is a numbing medicine. You might wear a patch or be given a shot. ? Mexiletine. This medicine is typically used to help control irregular heart rhythms.  Surgery. Surgery may be needed to relieve pressure on a nerve or to destroy a nerve that is causing pain.  Physical therapy to help movement.  Assistive devices to help movement. Follow these instructions at home:  Only take over-the-counter or prescription medicines as directed by your health care provider. Follow the instructions carefully for any given medicines. Do not take any other medicines without first getting approval from your health care provider.  If you have diabetes, work closely with your health care provider to keep your blood sugar under control.  If you have numbness in your feet: ? Check every day for signs of injury or infection. Watch for redness, warmth, and swelling. ? Wear padded socks and comfortable   shoes. These help protect your feet.  Do not do things that put pressure on your damaged nerve.  Do not smoke. Smoking keeps blood from getting to damaged nerves.  Avoid or limit alcohol. Too much alcohol can cause a lack of B vitamins. These vitamins are needed for healthy nerves.  Develop a good  support system. Coping with peripheral neuropathy can be stressful. Talk to a mental health specialist or join a support group if you are struggling.  Follow up with your health care provider as directed. Contact a health care provider if:  You have new signs or symptoms of peripheral neuropathy.  You are struggling emotionally from dealing with peripheral neuropathy.  You have a fever. Get help right away if:  You have an injury or infection that is not healing.  You feel very dizzy or begin vomiting.  You have chest pain.  You have trouble breathing. This information is not intended to replace advice given to you by your health care provider. Make sure you discuss any questions you have with your health care provider. Document Released: 06/29/2002 Document Revised: 12/15/2015 Document Reviewed: 03/16/2013 Elsevier Interactive Patient Education  2017 [employer]  Sciatica Sciatica is pain, numbness, weakness, or tingling along the path of the sciatic nerve. The sciatic nerve starts in the lower back and runs down the back of each leg. The nerve controls the muscles in the lower leg and in the back of the knee. It also provides feeling (sensation) to the back of the thigh, the lower leg, and the sole of the foot. Sciatica is a symptom of another medical condition that pinches or puts pressure on the sciatic nerve. Generally, sciatica only affects one side of the body. Sciatica usually goes away on its own or with treatment. In some cases, sciatica may keep coming back (recur). What are the causes? This condition is caused by pressure on the sciatic nerve, or pinching of the sciatic nerve. This may be the result of:  A disk in between the bones of the spine (vertebrae) bulging out too far (herniated disk).  Age-related changes in the spinal disks (degenerative disk disease).  A pain disorder that affects a muscle in the buttock (piriformis syndrome).  Extra bone growth (bone  spur) near the sciatic nerve.  An injury or break (fracture) of the pelvis.  Pregnancy.  Tumor (rare).  What increases the risk? The following factors may make you more likely to develop this condition:  Playing sports that place pressure or stress on the spine, such as football or weight lifting.  Having poor strength and flexibility.  A history of back injury.  A history of back surgery.  Sitting for long periods of time.  Doing activities that involve repetitive bending or lifting.  Obesity.  What are the signs or symptoms? Symptoms can vary from mild to very severe, and they may include:  Any of these problems in the lower back, leg, hip, or buttock: ? Mild tingling or dull aches. ? Burning sensations. ? Sharp pains.  Numbness in the back of the calf or the sole of the foot.  Leg weakness.  Severe back pain that makes movement difficult.  These symptoms may get worse when you cough, sneeze, or laugh, or when you sit or stand for long periods of time. Being overweight may also make symptoms worse. In some cases, symptoms may recur over time. How is this diagnosed? This condition may be diagnosed based on:  Your symptoms.  A physical  exam. Your health care provider may ask you to do certain movements to check whether those movements trigger your symptoms.  You may have tests, including: ? Blood tests. ? X-rays. ? MRI. ? CT scan.  How is this treated? In many cases, this condition improves on its own, without any treatment. However, treatment may include:  Reducing or modifying physical activity during periods of pain.  Exercising and stretching to strengthen your abdomen and improve the flexibility of your spine.  Icing and applying heat to the affected area.  Medicines that help: ? To relieve pain and swelling. ? To relax your muscles.  Injections of medicines that help to relieve pain, irritation, and inflammation around the sciatic nerve  (steroids).  Surgery.  Follow these instructions at home: Medicines  Take over-the-counter and prescription medicines only as told by your health care provider.  Do not drive or operate heavy machinery while taking prescription pain medicine. Managing pain  If directed, apply ice to the affected area. ? Put ice in a plastic bag. ? Place a towel between your skin and the bag. ? Leave the ice on for 20 minutes, 2-3 times a day.  After icing, apply heat to the affected area before you exercise or as often as told by your health care provider. Use the heat source that your health care provider recommends, such as a moist heat pack or a heating pad. ? Place a towel between your skin and the heat source. ? Leave the heat on for 20-30 minutes. ? Remove the heat if your skin turns bright red. This is especially important if you are unable to feel pain, heat, or cold. You may have a greater risk of getting burned. Activity  Return to your normal activities as told by your health care provider. Ask your health care provider what activities are safe for you. ? Avoid activities that make your symptoms worse.  Take brief periods of rest throughout the day. Resting in a lying or standing position is usually better than sitting to rest. ? When you rest for longer periods, mix in some mild activity or stretching between periods of rest. This will help to prevent stiffness and pain. ? Avoid sitting for long periods of time without moving. Get up and move around at least one time each hour.  Exercise and stretch regularly, as told by your health care provider.  Do not lift anything that is heavier than 10 lb (4.5 kg) while you have symptoms of sciatica. When you do not have symptoms, you should still avoid heavy lifting, especially repetitive heavy lifting.  When you lift objects, always use proper lifting technique, which includes: ? Bending your knees. ? Keeping the load close to your  body. ? Avoiding twisting. General instructions  Use good posture. ? Avoid leaning forward while sitting. ? Avoid hunching over while standing.  Maintain a healthy weight. Excess weight puts extra stress on your back and makes it difficult to maintain good posture.  Wear supportive, comfortable shoes. Avoid wearing high heels.  Avoid sleeping on a mattress that is too soft or too hard. A mattress that is firm enough to support your back when you sleep may help to reduce your pain.  Keep all follow-up visits as told by your health care provider. This is important. Contact a health care provider if:  You have pain that wakes you up when you are sleeping.  You have pain that gets worse when you lie down.  Your pain is  worse than you have experienced in the past.  Your pain lasts longer than 4 weeks.  You experience unexplained weight loss. Get help right away if:  You lose control of your bowel or bladder (incontinence).  You have: ? Weakness in your lower back, pelvis, buttocks, or legs that gets worse. ? Redness or swelling of your back. ? A burning sensation when you urinate. This information is not intended to replace advice given to you by your health care provider. Make sure you discuss any questions you have with your health care provider. Document Released: 07/03/2001 Document Revised: 12/13/2015 Document Reviewed: 03/18/2015 Elsevier Interactive Patient Education  2018 [employer].  Sciatica Rehab Ask your health care provider which exercises are safe for you. Do exercises exactly as told by your health care provider and adjust them as directed. It is normal to feel mild stretching, pulling, tightness, or discomfort as you do these exercises, but you should stop right away if you feel sudden pain or your pain gets worse.Do not begin these exercises until told by your health care provider. Stretching and range of motion exercises These exercises warm up your muscles  and joints and improve the movement and flexibility of your hips and your back. These exercises also help to relieve pain, numbness, and tingling. Exercise A: Sciatic nerve glide 1. Sit in a chair with your head facing down toward your chest. Place your hands behind your back. Let your shoulders slump forward. 2. Slowly straighten one of your knees while you tilt your head back as if you are looking toward the ceiling. Only straighten your leg as far as you can without making your symptoms worse. 3. Hold for __________ seconds. 4. Slowly return to the starting position. 5. Repeat with your other leg. Repeat __________ times. Complete this exercise __________ times a day. Exercise B: Knee to chest with hip adduction and internal rotation  1. Lie on your back on a firm surface with both legs straight. 2. Bend one of your knees and move it up toward your chest until you feel a gentle stretch in your lower back and buttock. Then, move your knee toward the shoulder that is on the opposite side from your leg. ? Hold your leg in this position by holding onto the front of your knee. 3. Hold for __________ seconds. 4. Slowly return to the starting position. 5. Repeat with your other leg. Repeat __________ times. Complete this exercise __________ times a day. Exercise C: Prone extension on elbows  1. Lie on your abdomen on a firm surface. A bed may be too soft for this exercise. 2. Prop yourself up on your elbows. 3. Use your arms to help lift your chest up until you feel a gentle stretch in your abdomen and your lower back. ? This will place some of your body weight on your elbows. If this is uncomfortable, try stacking pillows under your chest. ? Your hips should stay down, against the surface that you are lying on. Keep your hip and back muscles relaxed. 4. Hold for __________ seconds. 5. Slowly relax your upper body and return to the starting position. Repeat __________ times. Complete this  exercise __________ times a day. Strengthening exercises These exercises build strength and endurance in your back. Endurance is the ability to use your muscles for a long time, even after they get tired. Exercise D: Pelvic tilt 1. Lie on your back on a firm surface. Bend your knees and keep your feet flat. 2. Tense  your abdominal muscles. Tip your pelvis up toward the ceiling and flatten your lower back into the floor. ? To help with this exercise, you may place a small towel under your lower back and try to push your back into the towel. 3. Hold for __________ seconds. 4. Let your muscles relax completely before you repeat this exercise. Repeat __________ times. Complete this exercise __________ times a day. Exercise E: Alternating arm and leg raises  1. Get on your hands and knees on a firm surface. If you are on a hard floor, you may want to use padding to cushion your knees, such as an exercise mat. 2. Line up your arms and legs. Your hands should be below your shoulders, and your knees should be below your hips. 3. Lift your left leg behind you. At the same time, raise your right arm and straighten it in front of you. ? Do not lift your leg higher than your hip. ? Do not lift your arm higher than your shoulder. ? Keep your abdominal and back muscles tight. ? Keep your hips facing the ground. ? Do not arch your back. ? Keep your balance carefully, and do not hold your breath. 4. Hold for __________ seconds. 5. Slowly return to the starting position and repeat with your right leg and your left arm. Repeat __________ times. Complete this exercise __________ times a day. Posture and body mechanics  Body mechanics refers to the movements and positions of your body while you do your daily activities. Posture is part of body mechanics. Good posture and healthy body mechanics can help to relieve stress in your body's tissues and joints. Good posture means that your spine is in its natural  S-curve position (your spine is neutral), your shoulders are pulled back slightly, and your head is not tipped forward. The following are general guidelines for applying improved posture and body mechanics to your everyday activities. Standing   When standing, keep your spine neutral and your feet about hip-width apart. Keep a slight bend in your knees. Your ears, shoulders, and hips should line up.  When you do a task in which you stand in one place for a long time, place one foot up on a stable object that is 2-4 inches (5-10 cm) high, such as a footstool. This helps keep your spine neutral. Sitting   When sitting, keep your spine neutral and keep your feet flat on the floor. Use a footrest, if necessary, and keep your thighs parallel to the floor. Avoid rounding your shoulders, and avoid tilting your head forward.  When working at a desk or a computer, keep your desk at a height where your hands are slightly lower than your elbows. Slide your chair under your desk so you are close enough to maintain good posture.  When working at a computer, place your monitor at a height where you are looking straight ahead and you do not have to tilt your head forward or downward to look at the screen. Resting   When lying down and resting, avoid positions that are most painful for you.  If you have pain with activities such as sitting, bending, stooping, or squatting (flexion-based activities), lie in a position in which your body does not bend very much. For example, avoid curling up on your side with your arms and knees near your chest (fetal position).  If you have pain with activities such as standing for a long time or reaching with your arms (extension-based activities), lie  with your spine in a neutral position and bend your knees slightly. Try the following positions: ? Lying on your side with a pillow between your knees. ? Lying on your back with a pillow under your knees. Lifting   When  lifting objects, keep your feet at least shoulder-width apart and tighten your abdominal muscles.  Bend your knees and hips and keep your spine neutral. It is important to lift using the strength of your legs, not your back. Do not lock your knees straight out.  Always ask for help to lift heavy or awkward objects. This information is not intended to replace advice given to you by your health care provider. Make sure you discuss any questions you have with your health care provider. Document Released: 07/09/2005 Document Revised: 03/15/2016 Document Reviewed: 03/25/2015 Elsevier Interactive Patient Education  [employer].

## 2017-08-06 ENCOUNTER — Encounter: Payer: Self-pay | Admitting: Gastroenterology

## 2017-08-06 LAB — CBC WITH DIFFERENTIAL/PLATELET
Basophils Absolute: 0.1 10*3/uL (ref 0.0–0.2)
Basos: 1 %
EOS (ABSOLUTE): 0.2 10*3/uL (ref 0.0–0.4)
Eos: 3 %
Hematocrit: 45.9 % (ref 37.5–51.0)
Hemoglobin: 15.3 g/dL (ref 13.0–17.7)
Immature Grans (Abs): 0 10*3/uL (ref 0.0–0.1)
Immature Granulocytes: 0 %
Lymphocytes Absolute: 2.3 10*3/uL (ref 0.7–3.1)
Lymphs: 36 %
MCH: 32.4 pg (ref 26.6–33.0)
MCHC: 33.3 g/dL (ref 31.5–35.7)
MCV: 97 fL (ref 79–97)
Monocytes Absolute: 0.5 10*3/uL (ref 0.1–0.9)
Monocytes: 8 %
Neutrophils Absolute: 3.4 10*3/uL (ref 1.4–7.0)
Neutrophils: 52 %
Platelets: 223 10*3/uL (ref 150–379)
RBC: 4.72 x10E6/uL (ref 4.14–5.80)
RDW: 13.4 % (ref 12.3–15.4)
WBC: 6.5 10*3/uL (ref 3.4–10.8)

## 2017-08-06 LAB — PSA: Prostate Specific Ag, Serum: 0.4 ng/mL (ref 0.0–4.0)

## 2017-08-06 LAB — COMPREHENSIVE METABOLIC PANEL
ALT: 19 IU/L (ref 0–44)
AST: 21 IU/L (ref 0–40)
Albumin/Globulin Ratio: 2.4 — ABNORMAL HIGH (ref 1.2–2.2)
Albumin: 4.7 g/dL (ref 3.6–4.8)
Alkaline Phosphatase: 70 IU/L (ref 39–117)
BUN/Creatinine Ratio: 13 (ref 10–24)
BUN: 16 mg/dL (ref 8–27)
Bilirubin Total: 0.5 mg/dL (ref 0.0–1.2)
CO2: 25 mmol/L (ref 20–29)
Calcium: 9.4 mg/dL (ref 8.6–10.2)
Chloride: 100 mmol/L (ref 96–106)
Creatinine, Ser: 1.25 mg/dL (ref 0.76–1.27)
GFR calc Af Amer: 71 mL/min/{1.73_m2} (ref >59)
GFR calc non Af Amer: 61 mL/min/{1.73_m2} (ref >59)
Globulin, Total: 2 g/dL (ref 1.5–4.5)
Glucose: 110 mg/dL — ABNORMAL HIGH (ref 65–99)
Potassium: 3.9 mmol/L (ref 3.5–5.2)
Sodium: 140 mmol/L (ref 134–144)
Total Protein: 6.7 g/dL (ref 6.0–8.5)

## 2017-08-06 LAB — LIPID PANEL
Chol/HDL Ratio: 4.7 ratio (ref 0.0–5.0)
Cholesterol, Total: 146 mg/dL (ref 100–199)
HDL: 31 mg/dL — ABNORMAL LOW (ref >39)
LDL Calculated: 81 mg/dL (ref 0–99)
Triglycerides: 170 mg/dL — ABNORMAL HIGH (ref 0–149)
VLDL Cholesterol Cal: 34 mg/dL (ref 5–40)

## 2017-08-06 LAB — HEMOGLOBIN A1C
Est. average glucose Bld gHb Est-mCnc: 128 mg/dL
Hgb A1c MFr Bld: 6.1 % — ABNORMAL HIGH (ref 4.8–5.6)

## 2017-08-06 LAB — RPR: RPR Ser Ql: NONREACTIVE

## 2017-08-06 LAB — VITAMIN B12: Vitamin B-12: 468 pg/mL (ref 232–1245)

## 2017-08-06 LAB — TSH: TSH: 2.76 u[IU]/mL (ref 0.450–4.500)

## 2017-08-12 ENCOUNTER — Ambulatory Visit: Payer: 59 | Admitting: Physical Therapy

## 2017-08-13 ENCOUNTER — Ambulatory Visit (HOSPITAL_COMMUNITY)
Admission: RE | Admit: 2017-08-13 | Discharge: 2017-08-13 | Disposition: A | Discharge status: Home / Self Care | Payer: 59 | Source: Ambulatory Visit | Attending: Family Medicine | Admitting: Family Medicine

## 2017-08-13 DIAGNOSIS — K439 Ventral hernia without obstruction or gangrene: Secondary | ICD-10-CM | POA: Diagnosis not present

## 2017-08-13 DIAGNOSIS — R1901 Right upper quadrant abdominal swelling, mass and lump: Secondary | ICD-10-CM | POA: Insufficient documentation

## 2017-08-13 DIAGNOSIS — R1011 Right upper quadrant pain: Secondary | ICD-10-CM | POA: Diagnosis not present

## 2017-08-19 ENCOUNTER — Encounter: Payer: Self-pay | Admitting: Physical Therapy

## 2017-08-19 ENCOUNTER — Other Ambulatory Visit: Payer: Self-pay

## 2017-08-19 ENCOUNTER — Ambulatory Visit: Payer: 59 | Attending: Family Medicine | Admitting: Physical Therapy

## 2017-08-19 DIAGNOSIS — G8929 Other chronic pain: Secondary | ICD-10-CM | POA: Diagnosis not present

## 2017-08-19 DIAGNOSIS — M5442 Lumbago with sciatica, left side: Secondary | ICD-10-CM | POA: Insufficient documentation

## 2017-08-19 MED FILL — LISINOPRIL-HCTZ 20-25 MG TA: 20-25 | 90 days supply | Qty: 90 | Fill #0

## 2017-08-19 NOTE — Therapy (Signed)
[place], [place], [address] Phone: [phone]   Fax:  [phone]  Physical Therapy Evaluation  Patient Details  Name: Charles [name] MRN: [id number] Date of Birth: 20-Jan-1956 Referring Provider: Shawnee Knapp, MD   Encounter Date: 08/19/2017  PT End of Session - 08/19/17 1023    Visit Number  1    Number of Visits  9    Date for PT Re-Evaluation  09/20/17    Authorization Type  [facility] [employer]    PT Start Time  1017    PT Stop Time  1057    PT Time Calculation (min)  40 min    Activity Tolerance  Patient tolerated treatment well    Behavior During Therapy  [facility] for tasks assessed/performed       Past Medical History:  Diagnosis Date  . Allergy   . Arthritis   . CLL (chronic lymphoblastic leukemia) 08/03/2011  . CLL (chronic lymphocytic leukemia) ([place]) 03/18/2012  . GERD (gastroesophageal reflux disease)   . Shingles   . Ulcer     Past Surgical History:  Procedure Laterality Date  . HERNIA REPAIR    . KNEE SURGERY    . SHOULDER SURGERY    . WRIST SURGERY      There were no vitals filed for this visit.   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 I can bend and do what I want to do. Pain began within the last year, wears orthopedic inserts in bilateral shoes. Occasionally in bilat LE, mostly in [place]. When I am walking is when I have the problem, begins Lt side of lower back, runs down the back of my legs and my feet go numb. 2 injuries in the past to back. All I need to do is sit for a minute or so and get my leg straight and then I am fine.     Limitations  Walking    How long can you stand comfortably?  unknown period, occasionally occurs while standing still    How long can you walk comfortably?  unknown time, whenever it decides to hurt    Patient Stated Goals  decrease pain, figure out why I am having the problem    Currently in Pain?   No/denies not right now since I am sitting, pain with walking         [facility] PT Assessment - 08/19/17 0001      Assessment   Medical Diagnosis  LBP, bilateral peripheral neuropathy    Referring Provider  Shawnee Knapp, MD    Hand Dominance  Left    Next MD Visit  PRN    Prior Therapy  not this year      Precautions   Precaution Comments  CLL      Restrictions   Weight Bearing Restrictions  No      Balance Screen   Has the patient fallen in the past 6 months  No      Home Environment   Additional Comments  stairs at work, no stairs at home      Prior Function   Level of Independence  Independent    Vocation  Full time employment    Vocation Requirements  maintenance      Cognition   Overall Cognitive Status  Within Functional Limits for tasks assessed      Observation/Other Assessments   Focus on Therapeutic Outcomes (  FOTO)   37% limited      Sensation   Additional Comments  numbness in feet when pain arises      Posture/Postural Control   Posture Comments  anterior pelvic tilt,slight bend in knees      ROM / Strength   AROM / PROM / Strength  Strength      Strength   Strength Assessment Site  Hip    Right/Left Hip  Right;Left    Right Hip ABduction  4/5    Left Hip Extension  4/5    Left Hip ABduction  4/5             Objective measurements completed on examination: See above findings.      [facility] Adult PT Treatment/Exercise - 08/19/17 0001      Exercises   Exercises  Knee/Hip      Knee/Hip Exercises: Stretches   Active Hamstring Stretch Limitations  supine x5 each    Passive Hamstring Stretch Limitations  seated edge of chair    Hip Flexor Stretch Limitations  thomas test position    Piriformis Stretch Limitations  seated & supine      Knee/Hip Exercises: Seated   Other Seated Knee/Hip Exercises  seated posture with adductor activation             PT Education - 08/19/17 1027    Education provided  Yes    Education Details  anatomy  of condition, POC, HEP, exercise form/rationale, FOTO    Person(s) Educated  Patient    Methods  Explanation;Demonstration;Tactile cues;Verbal cues;Handout    Comprehension  Verbalized understanding;Need further instruction;Returned demonstration;Tactile cues required;Verbal cues required          PT Long Term Goals - 08/19/17 1101      PT LONG TERM GOAL #1   Title  Pt will be able to complete work related activities without limitaiton by pain/numbness    Baseline  limited and requires breaks when pain begins    Time  4    Period  Weeks    Status  New    Target Date  09/20/17      PT LONG TERM GOAL #2   Title  Pt will be independent in long term stretching and exercise routine for long term care    Baseline  began educating at eval    Time  4    Period  Weeks    Status  New    Target Date  09/20/17      PT LONG TERM GOAL #3   Title  hip abduction strength to 5/5 for proper support to lumbopelvic region    Baseline  see flowsheet    Time  4    Period  Weeks    Status  New    Target Date  09/20/17      PT LONG TERM GOAL #4   Title  FOTO to 29% limited    Baseline  37% limited at eval    Time  4    Period  Weeks    Status  New    Target Date  09/20/17             Plan - 08/19/17 1058    Clinical Impression Statement  Pt presents to PT with complaints of Lt sided LBP that extends to his foot along the posterior leg. Notable tightness along posterior chain that will benefit from stretching and retraining of biomechanical chain activation.     History and Personal  Factors relevant to plan of care:  chronic lymphoblastic leukemia, arthritis    Clinical Presentation  Stable    Clinical Decision Making  Low    Rehab Potential  Good    PT Frequency  2x / week    PT Duration  4 weeks    PT Treatment/Interventions  ADLs/Self Care Home Management;Cryotherapy;Traction;Moist Heat;Gait training;Stair training;Functional mobility training;Therapeutic activities;Therapeutic  exercise;Balance training;Patient/family education;Neuromuscular re-education;Manual techniques;Passive range of motion;Taping    PT Next Visit Plan  review stretches, glut strengthening in neutral, functional squatting    PT Home Exercise Plan  HSS, piriformis stretch, thomas stretch    Consulted and Agree with Plan of Care  Patient       Patient will benefit from skilled therapeutic intervention in order to improve the following deficits and impairments:  Improper body mechanics, Pain, Postural dysfunction, Increased muscle spasms, Decreased activity tolerance, Decreased strength, Difficulty walking  Visit Diagnosis: Chronic left-sided low back pain with left-sided sciatica - Plan: PT plan of care cert/re-cert     Problem List Patient Active Problem List   Diagnosis Date Noted  . Essential hypertension, benign 08/02/2016  . Pre-diabetes 07/14/2015  . Obesity 07/14/2015  . GERD (gastroesophageal reflux disease) 12/25/2012  . Pure hypercholesterolemia 12/25/2012  . CLL (chronic lymphocytic leukemia) ([place]) 03/18/2012   Harolyn Cocker C. Teriann Livingood PT, DPT 08/19/17 12:56 PM   [facility] [address] [place], [place], [address] Phone: [phone]   Fax:  [phone]  Name: Charles [name] MRN: [id number] Date of Birth: 15-May-1956

## 2017-09-02 ENCOUNTER — Ambulatory Visit: Payer: 59 | Admitting: Physical Therapy

## 2017-09-02 ENCOUNTER — Encounter: Payer: Self-pay | Admitting: Physical Therapy

## 2017-09-02 ENCOUNTER — Ambulatory Visit: Payer: 59 | Attending: Family Medicine | Admitting: Physical Therapy

## 2017-09-02 DIAGNOSIS — G8929 Other chronic pain: Secondary | ICD-10-CM | POA: Diagnosis not present

## 2017-09-02 DIAGNOSIS — M5442 Lumbago with sciatica, left side: Secondary | ICD-10-CM | POA: Insufficient documentation

## 2017-09-02 NOTE — Patient Instructions (Signed)
Supine Trunk stabilization exercises:  Daily 5 to 10 X Hold 5 to 0 seconds.  Bent knee lift and hold 5 seconds with abdominal bracing Bridge March feet out and back with abdominal bracing  Dead bug

## 2017-09-02 NOTE — Therapy (Signed)
[place], [place], [address] Phone: [phone]   Fax:  [phone]  Physical Therapy Treatment  Patient Details  Name: Charles [name] MRN: [id number] Date of Birth: Feb 14, 1956 Referring Provider: Shawnee Knapp, MD   Encounter Date: 09/02/2017  PT End of Session - 09/02/17 1218    Visit Number  2    Number of Visits  9    Date for PT Re-Evaluation  09/20/17    PT Start Time  1017    PT Stop Time  1100    PT Time Calculation (min)  43 min    Activity Tolerance  Patient tolerated treatment well    Behavior During Therapy  [facility] for tasks assessed/performed       Past Medical History:  Diagnosis Date  . Allergy   . Arthritis   . CLL (chronic lymphoblastic leukemia) 08/03/2011  . CLL (chronic lymphocytic leukemia) ([place]) 03/18/2012  . GERD (gastroesophageal reflux disease)   . Shingles   . Ulcer     Past Surgical History:  Procedure Laterality Date  . HERNIA REPAIR    . KNEE SURGERY    . SHOULDER SURGERY    . WRIST SURGERY      There were no vitals filed for this visit.  Subjective Assessment - 09/02/17 1017    Subjective  Did the sitting exercises.  Pain at work in leg 3-4 X a day.  ,  1-2 X a day on weekends.      Currently in Pain?  No/denies                      [facility] Adult PT Treatment/Exercise - 09/02/17 0001      Lumbar Exercises: Supine   Ab Set  5 reps    Bent Knee Raise  5 reps;5 seconds 3 inch off mat ,  HEP,  cues,      Dead Bug  5 reps HEP  arms starting at 90    Bridge  5 reps    Other Supine Lumbar Exercises  Abdominal brace with small steps out and back,  cued,  monitored ,  HEP      Knee/Hip Exercises: Stretches   Passive Hamstring Stretch  3 reps;30 seconds left.  Discouraged standing and touching his toes.    Quad Stretch  3 reps;30 seconds right  PROM    Hip Flexor Stretch Limitations  thomas test position 3 X 30 seconds  left    Piriformis Stretch  Limitations  seated & supine      Knee/Hip Exercises: Supine   Bridges  5 reps    Patellar Mobs  yes    Other Supine Knee/Hip Exercises  bent knee, 3 inch lift and hold 5 seconds alternating,  small steps,               PT Education - 09/02/17 1215    Education provided  Yes    Education Details  HEP.  how to stretch hamstrings correctly.    Person(s) Educated  Patient    Methods  Explanation;Tactile cues;Verbal cues;Handout    Comprehension  Verbalized understanding;Returned demonstration          PT Long Term Goals - 08/19/17 1101      PT LONG TERM GOAL #1   Title  Pt will be able to complete work related activities without limitaiton by pain/numbness    Baseline  limited and requires breaks when pain begins  Time  4    Period  Weeks    Status  New    Target Date  09/20/17      PT LONG TERM GOAL #2   Title  Pt will be independent in long term stretching and exercise routine for long term care    Baseline  began educating at eval    Time  4    Period  Weeks    Status  New    Target Date  09/20/17      PT LONG TERM GOAL #3   Title  hip abduction strength to 5/5 for proper support to lumbopelvic region    Baseline  see flowsheet    Time  4    Period  Weeks    Status  New    Target Date  09/20/17      PT LONG TERM GOAL #4   Title  FOTO to 29% limited    Baseline  37% limited at eval    Time  4    Period  Weeks    Status  New    Target Date  09/20/17            Plan - 09/02/17 1219    Clinical Impression Statement  Pain in leg 3-4 x a day during work.  Focus today on stretching and stabilization.  Patient requires cues for keeping upper  body and neck relaxed on the pillow  , ( he was getting neck pain from trying so hard.  )  he noted awareness with low back but no pain at end of session.  he declined the need for modalities.     PT Next Visit Plan  Continue to review stretches, glut strengthening in neutral, functional squatting    PT Home  Exercise Plan  HSS, piriformis stretch, thomas stretch    Consulted and Agree with Plan of Care  Patient       Patient will benefit from skilled therapeutic intervention in order to improve the following deficits and impairments:     Visit Diagnosis: Chronic left-sided low back pain with left-sided sciatica     Problem List Patient Active Problem List   Diagnosis Date Noted  . Essential hypertension, benign 08/02/2016  . Pre-diabetes 07/14/2015  . Obesity 07/14/2015  . GERD (gastroesophageal reflux disease) 12/25/2012  . Pure hypercholesterolemia 12/25/2012  . CLL (chronic lymphocytic leukemia) ([place]) 03/18/2012    Kristyanna Barcelo PTA 09/02/2017, 12:25 PM  [place] [facility] [address] [place], [place], [address] Phone: [phone]   Fax:  [phone]  Name: Charles [name] MRN: [id number] Date of Birth: 1955/08/26

## 2017-09-04 ENCOUNTER — Ambulatory Visit: Payer: 59 | Admitting: Physical Therapy

## 2017-09-04 ENCOUNTER — Encounter: Payer: Self-pay | Admitting: Physical Therapy

## 2017-09-04 DIAGNOSIS — M5442 Lumbago with sciatica, left side: Principal | ICD-10-CM

## 2017-09-04 DIAGNOSIS — G8929 Other chronic pain: Secondary | ICD-10-CM | POA: Diagnosis not present

## 2017-09-04 NOTE — Patient Instructions (Signed)
Abduction: Clam (Eccentric) - Side-Lying    Lie on side with knees bent. Lift top knee, keeping feet together. Keep trunk steady. Slowly lower for 3-5 seconds. _10__ reps per set, 1-2___ sets per day, _3-5__ days per week.   http://ecce.exer.us/64   Copyright  VHI. All rights reserved.

## 2017-09-04 NOTE — Therapy (Signed)
[employer] [place], [place], [address] Phone: [phone]   Fax:  [phone]  Physical Therapy Treatment  Patient Details  Name: Charles [name] MRN: [id number] Date of Birth: Dec 08, 1955 Referring Provider: Shawnee Knapp, MD   Encounter Date: 09/04/2017  PT End of Session - 09/04/17 1140    Visit Number  3    Number of Visits  9    Date for PT Re-Evaluation  09/20/17    PT Start Time  1017    PT Stop Time  1103    PT Time Calculation (min)  46 min    Activity Tolerance  Patient tolerated treatment well    Behavior During Therapy  [facility] for tasks assessed/performed       Past Medical History:  Diagnosis Date  . Allergy   . Arthritis   . CLL (chronic lymphoblastic leukemia) 08/03/2011  . CLL (chronic lymphocytic leukemia) ([place]) 03/18/2012  . GERD (gastroesophageal reflux disease)   . Shingles   . Ulcer     Past Surgical History:  Procedure Laterality Date  . HERNIA REPAIR    . KNEE SURGERY    . SHOULDER SURGERY    . WRIST SURGERY      There were no vitals filed for this visit.                   [facility] Adult PT Treatment/Exercise - 09/04/17 0001      Lumbar Exercises: Stretches   Double Knee to Chest Stretch Limitations  10 X AA feet on red ball    Passive Hamstring Stretch  3 reps;30 seconds left.  Discouraged standing and touching his toes.    Quad Stretch  3 reps;30 seconds right  PROM    Piriformis Stretch Limitations   supine3 X 30      Lumbar Exercises: Supine   Bridge  10 reps    Single Leg Bridge  5 reps each,  felt pressure vs pain      Knee/Hip Exercises: Stretches   Hip Flexor Stretch Limitations  thomas test position 3 X 30 seconds  left      Knee/Hip Exercises: Sidelying   Clams  10 x each,  cued initilly  HEP             PT Education - 09/04/17 1137    Education provided  Yes    Education Details  HEP    Person(s) Educated  Patient    Comprehension   Verbalized understanding          PT Long Term Goals - 08/19/17 1101      PT LONG TERM GOAL #1   Title  Pt will be able to complete work related activities without limitaiton by pain/numbness    Baseline  limited and requires breaks when pain begins    Time  4    Period  Weeks    Status  New    Target Date  09/20/17      PT LONG TERM GOAL #2   Title  Pt will be independent in long term stretching and exercise routine for long term care    Baseline  began educating at eval    Time  4    Period  Weeks    Status  New    Target Date  09/20/17      PT LONG TERM GOAL #3   Title  hip abduction strength to 5/5 for proper support to lumbopelvic region  Baseline  see flowsheet    Time  4    Period  Weeks    Status  New    Target Date  09/20/17      PT LONG TERM GOAL #4   Title  FOTO to 29% limited    Baseline  37% limited at eval    Time  4    Period  Weeks    Status  New    Target Date  09/20/17            Plan - 09/04/17 1141    Clinical Impression Statement  no leg pain today, 1/10 pain in gluteals at end of session upon sitting.  There was no pain with supine exercises except for occasional hamstring cramp.  Strengthening for HEP today.  progress toward HEP goals.     PT Next Visit Plan  Continue to review stretches, glut strengthening in neutral, functional squatting    PT Home Exercise Plan  HSS, piriformis stretch, thomas stretch,  clam on side    Consulted and Agree with Plan of Care  Patient       Patient will benefit from skilled therapeutic intervention in order to improve the following deficits and impairments:     Visit Diagnosis: Chronic left-sided low back pain with left-sided sciatica     Problem List Patient Active Problem List   Diagnosis Date Noted  . Essential hypertension, benign 08/02/2016  . Pre-diabetes 07/14/2015  . Obesity 07/14/2015  . GERD (gastroesophageal reflux disease) 12/25/2012  . Pure hypercholesterolemia 12/25/2012  .  CLL (chronic lymphocytic leukemia) ([place]) 03/18/2012    Charles [name] PTA 09/04/2017, 11:45 AM  [place], [place], [address] Phone: [phone]   Fax:  [phone]  Name: Charles [name] MRN: [id number] Date of Birth: April 16, 1956

## 2017-09-09 ENCOUNTER — Ambulatory Visit: Payer: 59 | Admitting: Physical Therapy

## 2017-09-09 ENCOUNTER — Encounter: Payer: Self-pay | Admitting: Physical Therapy

## 2017-09-09 DIAGNOSIS — G8929 Other chronic pain: Secondary | ICD-10-CM | POA: Diagnosis not present

## 2017-09-09 DIAGNOSIS — M5442 Lumbago with sciatica, left side: Secondary | ICD-10-CM | POA: Diagnosis not present

## 2017-09-09 NOTE — Therapy (Signed)
[place], [place], [address] Phone: [phone]   Fax:  [phone]  Physical Therapy Treatment  Patient Details  Name: Charles [name] MRN: [id number] Date of Birth: December 30, 1955 Referring Provider: Shawnee Knapp, MD   Encounter Date: 09/09/2017  PT End of Session - 09/09/17 1302    Visit Number  4    Number of Visits  9    Date for PT Re-Evaluation  09/20/17    PT Start Time  1150    PT Stop Time  1236    PT Time Calculation (min)  46 min    Activity Tolerance  Patient tolerated treatment well    Behavior During Therapy  [facility] for tasks assessed/performed       Past Medical History:  Diagnosis Date  . Allergy   . Arthritis   . CLL (chronic lymphoblastic leukemia) 08/03/2011  . CLL (chronic lymphocytic leukemia) ([place]) 03/18/2012  . GERD (gastroesophageal reflux disease)   . Shingles   . Ulcer     Past Surgical History:  Procedure Laterality Date  . HERNIA REPAIR    . KNEE SURGERY    . SHOULDER SURGERY    . WRIST SURGERY      There were no vitals filed for this visit.  Subjective Assessment - 09/09/17 1154    Subjective  i was sore after the last session,  muscles.  hamstrings are a little less tight.  Feet less numb,  legs did not bother me at all.    Currently in Pain?  No/denies i just know ti is there,  feet numb                      [facility] Adult PT Treatment/Exercise - 09/09/17 0001      Lumbar Exercises: Stretches   Single Knee to Chest Stretch  3 reps      Knee/Hip Exercises: Stretches   Passive Hamstring Stretch  3 reps;30 seconds left.  Discouraged standing and touching his toes.    Quad Stretch  3 reps;30 seconds right  PROM    Hip Flexor Stretch Limitations  thomas test position 3 X 30 seconds  left    Piriformis Stretch Limitations   supine3 X 30    Other Knee/Hip Stretches  hip Abductor stretch 3 x 30 seconds  each    Other Knee/Hip Stretches  Hip IR 3 x 30  seconds each      Knee/Hip Exercises: Aerobic   Nustep  level 5 5 minutes UE/LE                  PT Long Term Goals - 09/09/17 1257      PT LONG TERM GOAL #1   Title  Pt will be able to complete work related activities without limitaiton by pain/numbness    Baseline  limited and requires breaks when pain begins    Time  4    Period  Weeks    Status  On-going      PT LONG TERM GOAL #2   Title  Pt will be independent in long term stretching and exercise routine for long term care    Baseline  cues for stretching    Time  4    Period  Weeks    Status  On-going      PT LONG TERM GOAL #3   Title  hip abduction strength to 5/5 for proper support to lumbopelvic region  Time  4    Period  Weeks    Status  Unable to assess      PT LONG TERM GOAL #4   Title  FOTO to 29% limited    Time  4    Period  Weeks    Status  Unable to assess            Plan - 09/09/17 1258    Clinical Impression Statement  No pain or pressure noted left low back at end of session. No pain.  ROM gradually improving.  Minor sues needd for stretches.  Patient notes feet getting numb less frequently,  less intense.    PT Next Visit Plan  hip abd strength,  HEP  work toward goals    PT Home Exercise Plan  HSS, piriformis stretch, thomas stretch,  clam on side    Consulted and Agree with Plan of Care  Patient       Patient will benefit from skilled therapeutic intervention in order to improve the following deficits and impairments:     Visit Diagnosis: Chronic left-sided low back pain with left-sided sciatica     Problem List Patient Active Problem List   Diagnosis Date Noted  . Essential hypertension, benign 08/02/2016  . Pre-diabetes 07/14/2015  . Obesity 07/14/2015  . GERD (gastroesophageal reflux disease) 12/25/2012  . Pure hypercholesterolemia 12/25/2012  . CLL (chronic lymphocytic leukemia) ([place]) 03/18/2012    HARRIS,KAREN  PTA 09/09/2017, 1:04 PM  [facility] [address] [place], [place], [address] Phone: [phone]   Fax:  [phone]  Name: Charles [name] MRN: [id number] Date of Birth: [birth year]

## 2017-09-11 ENCOUNTER — Ambulatory Visit: Payer: 59 | Admitting: Physical Therapy

## 2017-09-13 ENCOUNTER — Ambulatory Visit: Payer: 59 | Admitting: Gastroenterology

## 2017-09-13 ENCOUNTER — Encounter: Payer: Self-pay | Admitting: Gastroenterology

## 2017-09-13 VITALS — BP 120/70 | HR 78 | Ht 69.0 in | Wt 250.0 lb

## 2017-09-13 DIAGNOSIS — K219 Gastro-esophageal reflux disease without esophagitis: Secondary | ICD-10-CM | POA: Diagnosis not present

## 2017-09-13 NOTE — Patient Instructions (Addendum)
Try ranitidine 150mg  pill at bedtime on nights you think you may have trouble with GERD.  Return to see Dr. Ardis Hughs' as needed.  Normal BMI (Body Mass Index- based on height and weight) is between 19 and 25. Your BMI today is Body mass index is 36.92 kg/m. Marland Kitchen Please consider follow up  regarding your BMI with your Primary Care Provider.

## 2017-09-13 NOTE — Progress Notes (Signed)
HPI: This is a very pleasant 62 year old man who was referred to me by Shawnee Knapp, MD  to evaluate GERD.    Chief complaint is GERD  Has had acid reflux for years.  PPIs for many years  With pretty good results.  He describes his acid reflux as heartburn in his chest.  Acid taste in his mouth.  Sometimes he will have to get up in the middle the night because of this and Tums will usually help at that time.  Takes on pill per day nexium takes 11pm doesn't eat prior.    Works at [employer].  Grabs a tums periodically.  He has no dysphasia.  He has no overt GI bleeding, he has no unintentional weight loss  Old Data Reviewed:  Colo guard colon cancer screening test was negative January 2017   Review of systems: Pertinent positive and negative review of systems were noted in the above HPI section. All other review negative.   Past Medical History:  Diagnosis Date  . Allergy   . Arthritis   . CLL (chronic lymphoblastic leukemia) 08/03/2011  . CLL (chronic lymphocytic leukemia) ([place]) 03/18/2012  . GERD (gastroesophageal reflux disease)   . Shingles   . Ulcer     Past Surgical History:  Procedure Laterality Date  . HERNIA REPAIR    . KNEE SURGERY    . SHOULDER SURGERY    . WRIST SURGERY      Current Outpatient Medications  Medication Sig Dispense Refill  . aspirin 81 MG tablet Take 81 mg by mouth daily.    . cetirizine (ZYRTEC) 10 MG tablet Take 10 mg by mouth daily.    . Cholecalciferol (VITAMIN D3) 2000 UNITS TABS Take 2,000 Units by mouth.    . esomeprazole (NEXIUM) 40 MG capsule Take 1 capsule (40 mg total) by mouth daily. Before bed 90 capsule 3  . famciclovir (FAMVIR) 500 MG tablet Take 1 tablet (500 mg total) by mouth every morning. 90 tablet 3  . lisinopril-hydrochlorothiazide (PRINZIDE,ZESTORETIC) 20-25 MG tablet Take 1 tablet by mouth daily. 90 tablet 3  . meloxicam (MOBIC) 15 MG tablet Take 1 tablet (15 mg total) by mouth daily. 30 tablet 10  . rosuvastatin  (CRESTOR) 10 MG tablet Take 1 tablet (10 mg total) by mouth every morning. 90 tablet 3  . sildenafil (VIAGRA) 100 MG tablet Take 0.5 tablets (50 mg total) by mouth daily as needed for erectile dysfunction. 10 tablet 11   No current facility-administered medications for this visit.     Allergies as of 09/13/2017  . (No Known Allergies)    Family History  Problem Relation Age of Onset  . Cancer Brother 26       prostate cancer stage 4  . Hyperlipidemia Father   . Cancer Brother 76       prostate  . Cancer Sister   . Diabetes Mother     Social History   Socioeconomic History  . Marital status: Married    Spouse name: Not on file  . Number of children: Not on file  . Years of education: Not on file  . Highest education level: Not on file  Social Needs  . Financial resource strain: Not on file  . Food insecurity - worry: Not on file  . Food insecurity - inability: Not on file  . Transportation needs - medical: Not on file  . Transportation needs - non-medical: Not on file  Occupational History  . Occupation: [employer]  Tobacco Use  . Smoking status: Former Smoker    Packs/day: 2.00    Years: 20.00    Pack years: 40.00    Types: Cigarettes    Start date: 08/24/1978    Last attempt to quit: 08/24/1998    Years since quitting: 19.0  . Smokeless tobacco: Never Used  . Tobacco comment: quit smoking 15 years ago  Substance and Sexual Activity  . Alcohol use: Yes    Alcohol/week: 0.0 oz    Comment: 1 a week  . Drug use: No  . Sexual activity: Yes    Partners: Female    Birth control/protection: None  Other Topics Concern  . Not on file  Social History Narrative   Married. Education: [employer]. Exercise: Walk    Education: [employer]   Works for [employer] in [place] Exam: BP 120/70   Pulse 78   Ht 5\' 9"  (1.753 m)   Wt 250 lb (113.4 kg)   BMI 36.92 kg/m  Constitutional: generally well-appearing Psychiatric: alert and oriented x3 Eyes:  extraocular movements intact Mouth: oral pharynx moist, no lesions Neck: supple no lymphadenopathy Cardiovascular: heart regular rate and rhythm Lungs: clear to auscultation bilaterally Abdomen: soft, nontender, nondistended, no obvious ascites, no peritoneal signs, normal bowel sounds Extremities: no lower extremity edema bilaterally Skin: no lesions on visible extremities   Assessment and plan: 62 y.o. male with chronic GERD without alarm symptoms  He is on proton pump inhibitor that seems to work very well overall.  He does have some breakthrough after he goes to bed and Tums will help.  I advised he try H2 blocker at bedtime on the nights that he thinks he might be troubled by GERD.  Otherwise since he has no alarm symptoms I think there is no reason to proceed with upper endoscopy at this point.  He knows to call if he has any further questions or concerns.    Please see the "Patient Instructions" section for addition details about the plan.   Owens Loffler, MD [facility] 09/13/2017, 10:02 AM  Cc: Shawnee Knapp, MD

## 2017-09-16 ENCOUNTER — Ambulatory Visit: Payer: 59 | Admitting: Physical Therapy

## 2017-09-16 ENCOUNTER — Encounter: Payer: Self-pay | Admitting: Physical Therapy

## 2017-09-16 DIAGNOSIS — M5442 Lumbago with sciatica, left side: Secondary | ICD-10-CM | POA: Diagnosis not present

## 2017-09-16 DIAGNOSIS — G8929 Other chronic pain: Secondary | ICD-10-CM | POA: Diagnosis not present

## 2017-09-16 NOTE — Therapy (Signed)
[place], [place], [address] Phone: [phone]   Fax:  [phone]  Physical Therapy Treatment  Patient Details  Name: Charles [name] MRN: [id number] Date of Birth: Sep 27, 1955 Referring Provider: Shawnee Knapp, MD   Encounter Date: 09/16/2017  PT End of Session - 09/16/17 1146    Visit Number  5    Number of Visits  9    Date for PT Re-Evaluation  09/20/17    Authorization Type  [facility]    PT Start Time  1146    PT Stop Time  1224    PT Time Calculation (min)  38 min    Activity Tolerance  Patient tolerated treatment well    Behavior During Therapy  [facility] for tasks assessed/performed       Past Medical History:  Diagnosis Date  . Allergy   . Arthritis   . CLL (chronic lymphoblastic leukemia) 08/03/2011  . CLL (chronic lymphocytic leukemia) ([place]) 03/18/2012  . GERD (gastroesophageal reflux disease)   . Shingles   . Ulcer     Past Surgical History:  Procedure Laterality Date  . HERNIA REPAIR    . KNEE SURGERY    . SHOULDER SURGERY    . WRIST SURGERY      There were no vitals filed for this visit.  Subjective Assessment - 09/16/17 1146    Subjective  It has gotten better but is still not gone. Feet still go numb when walking down hallway. LBP generally on Lt hand side. Has not had leg pain as frequently.     Patient Stated Goals  decrease pain, figure out why I am having the problem    Currently in Pain?  No/denies                      [facility] Adult PT Treatment/Exercise - 09/16/17 0001      Lumbar Exercises: Supine   Straight Leg Raise  10 reps 2x each side with ab set      Knee/Hip Exercises: Stretches   Passive Hamstring Stretch Limitations  both 2x30s seated EOB    Piriformis Stretch Limitations  seated EOB      Knee/Hip Exercises: Machines for Strengthening   Cybex Leg Press  horizontal leg press      Knee/Hip Exercises: Standing   Other Standing Knee Exercises   step/reach with freemotion resistance, 7lb    Other Standing Knee Exercises  glut sets at wall      Knee/Hip Exercises: Sidelying   Hip ABduction  Both;15 reps cues for abdominal engagement             PT Education - 09/16/17 1247    Education provided  Yes    Education Details  shoe wear, exercise form/rationale, progressing endurance for upright postures    Person(s) Educated  Patient    Methods  Explanation;Demonstration;Tactile cues;Verbal cues;Handout    Comprehension  Verbalized understanding;Need further instruction;Returned demonstration;Verbal cues required;Tactile cues required          PT Long Term Goals - 09/09/17 1257      PT LONG TERM GOAL #1   Title  Pt will be able to complete work related activities without limitaiton by pain/numbness    Baseline  limited and requires breaks when pain begins    Time  4    Period  Weeks    Status  On-going      PT LONG TERM GOAL #2  Title  Pt will be independent in long term stretching and exercise routine for long term care    Baseline  cues for stretching    Time  4    Period  Weeks    Status  On-going      PT LONG TERM GOAL #3   Title  hip abduction strength to 5/5 for proper support to lumbopelvic region    Time  4    Period  Weeks    Status  Unable to assess      PT LONG TERM GOAL #4   Title  FOTO to 29% limited    Time  4    Period  Weeks    Status  Unable to assess            Plan - 09/16/17 1225    Clinical Impression Statement  Pt reported tightness in hamstrings with significant improvement in LBP. Takes seated rest breaks at work still but is able to occasionally reduce pain in standing position. Reported reduced hamstring tightness after leg press maching with emphasis on core/glut activation.     PT Treatment/Interventions  ADLs/Self Care Home Management;Cryotherapy;Traction;Moist Heat;Gait training;Stair training;Functional mobility training;Therapeutic activities;Therapeutic  exercise;Balance training;Patient/family education;Neuromuscular re-education;Manual techniques;Passive range of motion;Taping    PT Next Visit Plan  re-eval    PT Home Exercise Plan  HSS, piriformis stretch, thomas stretch,  clam on side; SLR & hip abd with abdominal engagement    Consulted and Agree with Plan of Care  Patient       Patient will benefit from skilled therapeutic intervention in order to improve the following deficits and impairments:  Improper body mechanics, Pain, Postural dysfunction, Increased muscle spasms, Decreased activity tolerance, Decreased strength, Difficulty walking  Visit Diagnosis: Chronic left-sided low back pain with left-sided sciatica     Problem List Patient Active Problem List   Diagnosis Date Noted  . Essential hypertension, benign 08/02/2016  . Pre-diabetes 07/14/2015  . Obesity 07/14/2015  . GERD (gastroesophageal reflux disease) 12/25/2012  . Pure hypercholesterolemia 12/25/2012  . CLL (chronic lymphocytic leukemia) ([place]) 03/18/2012    Tamari Redwine C. Yuliana Vandrunen PT, DPT 09/16/17 12:48 PM   [facility] [address] [place], [place], [address] Phone: [phone]   Fax:  [phone]  Name: Charles [name] MRN: [id number] Date of Birth: November 13, 1955

## 2017-09-18 ENCOUNTER — Ambulatory Visit: Payer: 59 | Admitting: Physical Therapy

## 2017-09-18 ENCOUNTER — Encounter: Payer: Self-pay | Admitting: Physical Therapy

## 2017-09-18 DIAGNOSIS — M5442 Lumbago with sciatica, left side: Secondary | ICD-10-CM | POA: Diagnosis not present

## 2017-09-18 DIAGNOSIS — G8929 Other chronic pain: Secondary | ICD-10-CM | POA: Diagnosis not present

## 2017-09-18 NOTE — Therapy (Addendum)
[place], [place], [address] Phone: [phone]   Fax:  [phone]  Physical Therapy Treatment/Discharge Summary  Patient Details  Name: MALICHI PALARDY MRN: [id number] Date of Birth: 1955-09-30 Referring Provider: Shawnee Knapp, MD   Encounter Date: 09/18/2017  PT End of Session - 09/18/17 1101    Visit Number  6    Number of Visits  9    Date for PT Re-Evaluation  09/20/17    Authorization Type  [facility]    PT Start Time  1101    PT Stop Time  1141    PT Time Calculation (min)  40 min    Activity Tolerance  Patient tolerated treatment well    Behavior During Therapy  [facility] for tasks assessed/performed       Past Medical History:  Diagnosis Date  . Allergy   . Arthritis   . CLL (chronic lymphoblastic leukemia) 08/03/2011  . CLL (chronic lymphocytic leukemia) ([place]) 03/18/2012  . GERD (gastroesophageal reflux disease)   . Shingles   . Ulcer     Past Surgical History:  Procedure Laterality Date  . HERNIA REPAIR    . KNEE SURGERY    . SHOULDER SURGERY    . WRIST SURGERY      There were no vitals filed for this visit.  Subjective Assessment - 09/18/17 1101    Subjective  My hip- glut max- was aching this AM when I was sitting on the couch but is fine now.     Patient Stated Goals  decrease pain, figure out why I am having the problem    Currently in Pain?  No/denies                      [facility] Adult PT Treatment/Exercise - 09/18/17 0001      Exercises   Exercises  Lumbar      Lumbar Exercises: Stretches   Passive Hamstring Stretch Limitations  both 2x30s seated EOB    Piriformis Stretch Limitations  2x30s each supine      Lumbar Exercises: Standing   Other Standing Lumbar Exercises  GHJ flexion against wall- focus on TrA engagement, tband in hands      Lumbar Exercises: Supine   Bent Knee Raise  Other (comment);10 reps;5 seconds alt lift to table top hold    Dead Bug  10  reps 3 sets      Knee/Hip Exercises: Aerobic   Nustep  5 min L7 UE & LE      Knee/Hip Exercises: Sidelying   Clams  x15 each blue tband on knees      Knee/Hip Exercises: Prone   Other Prone Exercises  qped bird dog             PT Education - 09/18/17 1116    Education provided  Yes    Education Details  sleeping posture, exercise form/rationale    Person(s) Educated  Patient    Methods  Explanation;Demonstration;Tactile cues;Verbal cues;Handout    Comprehension  Verbalized understanding;Need further instruction;Returned demonstration;Verbal cues required;Tactile cues required          PT Long Term Goals - 09/09/17 1257      PT LONG TERM GOAL #1   Title  Pt will be able to complete work related activities without limitaiton by pain/numbness    Baseline  limited and requires breaks when pain begins    Time  4    Period  Weeks  Status  On-going      PT LONG TERM GOAL #2   Title  Pt will be independent in long term stretching and exercise routine for long term care    Baseline  cues for stretching    Time  4    Period  Weeks    Status  On-going      PT LONG TERM GOAL #3   Title  hip abduction strength to 5/5 for proper support to lumbopelvic region    Time  4    Period  Weeks    Status  Unable to assess      PT LONG TERM GOAL #4   Title  FOTO to 29% limited    Time  4    Period  Weeks    Status  Unable to assess            Plan - 09/18/17 1145    Clinical Impression Statement  Progressed HEP for core challenges to improve endurance. Pt tends to shift work into hip flexors and quads indicating need for continued challenge to core.     PT Treatment/Interventions  ADLs/Self Care Home Management;Cryotherapy;Traction;Moist Heat;Gait training;Stair training;Functional mobility training;Therapeutic activities;Therapeutic exercise;Balance training;Patient/family education;Neuromuscular re-education;Manual techniques;Passive range of motion;Taping    PT Next  Visit Plan  re-eval    PT Home Exercise Plan  HSS, piriformis stretch, thomas stretch,  clam on side; SLR & hip abd with abdominal engagement, clam with band, bird dog, table top holds, dead bug    Consulted and Agree with Plan of Care  Patient       Patient will benefit from skilled therapeutic intervention in order to improve the following deficits and impairments:  Improper body mechanics, Pain, Postural dysfunction, Increased muscle spasms, Decreased activity tolerance, Decreased strength, Difficulty walking  Visit Diagnosis: Chronic left-sided low back pain with left-sided sciatica     Problem List Patient Active Problem List   Diagnosis Date Noted  . Essential hypertension, benign 08/02/2016  . Pre-diabetes 07/14/2015  . Obesity 07/14/2015  . GERD (gastroesophageal reflux disease) 12/25/2012  . Pure hypercholesterolemia 12/25/2012  . CLL (chronic lymphocytic leukemia) ([place]) 03/18/2012    Surena Welge C. Joshue Badal PT, DPT 09/18/17 11:47 AM   [facility] [address] [place], [place], [address] Phone: [phone]   Fax:  [phone]  Name: ABUBAKAR CRISPO MRN: [id number] Date of Birth: 1956/07/04  PHYSICAL THERAPY DISCHARGE SUMMARY  Visits from Start of Care: 6  Current functional level related to goals / functional outcomes: See above   Remaining deficits: See above   Education / Equipment: Anatomy of condition, POC, HEP, exercise form/rationale  Plan: Patient agrees to discharge.  Patient goals were not met. Patient is being discharged due to not returning since the last visit.  ?????     Myles Tavella C. Zabdiel Dripps PT, DPT 10/15/17 9:42 AM

## 2017-09-25 DIAGNOSIS — D224 Melanocytic nevi of scalp and neck: Secondary | ICD-10-CM | POA: Diagnosis not present

## 2017-09-25 DIAGNOSIS — D225 Melanocytic nevi of trunk: Secondary | ICD-10-CM | POA: Diagnosis not present

## 2017-09-25 DIAGNOSIS — D485 Neoplasm of uncertain behavior of skin: Secondary | ICD-10-CM | POA: Diagnosis not present

## 2017-09-25 DIAGNOSIS — Z85828 Personal history of other malignant neoplasm of skin: Secondary | ICD-10-CM | POA: Diagnosis not present

## 2017-09-25 DIAGNOSIS — C4359 Malignant melanoma of other part of trunk: Secondary | ICD-10-CM | POA: Diagnosis not present

## 2017-09-25 DIAGNOSIS — C44311 Basal cell carcinoma of skin of nose: Secondary | ICD-10-CM | POA: Diagnosis not present

## 2017-10-01 ENCOUNTER — Encounter: Payer: Self-pay | Admitting: Hematology & Oncology

## 2017-10-16 DIAGNOSIS — Z85828 Personal history of other malignant neoplasm of skin: Secondary | ICD-10-CM | POA: Diagnosis not present

## 2017-10-16 DIAGNOSIS — D0359 Melanoma in situ of other part of trunk: Secondary | ICD-10-CM | POA: Diagnosis not present

## 2017-10-16 DIAGNOSIS — C4359 Malignant melanoma of other part of trunk: Secondary | ICD-10-CM | POA: Diagnosis not present

## 2017-10-16 DIAGNOSIS — Z85820 Personal history of malignant melanoma of skin: Secondary | ICD-10-CM | POA: Diagnosis not present

## 2017-10-16 MED FILL — DOXYCYCLINE HYCLATE 100 MG: 100 | 10 days supply | Qty: 20 | Fill #0

## 2017-10-21 MED FILL — MELOXICAM 15 MG TABLET: 15 | 90 days supply | Qty: 90 | Fill #3

## 2017-10-21 MED FILL — ESOMEPRAZOLE MAG DR 40 MG C: 40 | 90 days supply | Qty: 90 | Fill #0

## 2017-10-21 MED FILL — FAMCICLOVIR 500 MG TABLET: 500 | 90 days supply | Qty: 90 | Fill #0

## 2017-10-21 MED FILL — ROSUVASTATIN CALCIUM 10 MG: 10 | 90 days supply | Qty: 90 | Fill #0

## 2017-10-23 DIAGNOSIS — Z85820 Personal history of malignant melanoma of skin: Secondary | ICD-10-CM | POA: Diagnosis not present

## 2017-10-23 DIAGNOSIS — C44311 Basal cell carcinoma of skin of nose: Secondary | ICD-10-CM | POA: Diagnosis not present

## 2017-10-23 DIAGNOSIS — Z85828 Personal history of other malignant neoplasm of skin: Secondary | ICD-10-CM | POA: Diagnosis not present

## 2017-11-11 MED FILL — LISINOPRIL-HCTZ 20-25 MG TA: 20-25 | 90 days supply | Qty: 90 | Fill #1

## 2018-01-11 ENCOUNTER — Encounter: Payer: Self-pay | Admitting: Hematology & Oncology

## 2018-01-14 MED FILL — MELOXICAM 15 MG TABLET: 15 | 30 days supply | Qty: 30 | Fill #4

## 2018-01-27 MED FILL — FAMCICLOVIR 500 MG TABLET: 500 | 90 days supply | Qty: 90 | Fill #1

## 2018-01-27 MED FILL — ESOMEPRAZOLE MAG DR 40 MG C: 40 | 90 days supply | Qty: 90 | Fill #1

## 2018-01-27 MED FILL — ROSUVASTATIN CALCIUM 10 MG: 10 | 90 days supply | Qty: 90 | Fill #1

## 2018-02-07 ENCOUNTER — Other Ambulatory Visit: Payer: Self-pay | Admitting: Family

## 2018-02-07 DIAGNOSIS — C9110 Chronic lymphocytic leukemia of B-cell type not having achieved remission: Secondary | ICD-10-CM

## 2018-02-10 ENCOUNTER — Encounter: Payer: Self-pay | Admitting: Family

## 2018-02-10 ENCOUNTER — Inpatient Hospital Stay: Payer: 59 | Attending: Hematology & Oncology | Admitting: Family

## 2018-02-10 ENCOUNTER — Other Ambulatory Visit: Payer: Self-pay

## 2018-02-10 ENCOUNTER — Inpatient Hospital Stay: Payer: 59

## 2018-02-10 VITALS — BP 126/77 | HR 55 | Temp 98.0°F | Resp 18 | Wt 248.0 lb

## 2018-02-10 DIAGNOSIS — C9110 Chronic lymphocytic leukemia of B-cell type not having achieved remission: Secondary | ICD-10-CM

## 2018-02-10 DIAGNOSIS — Z856 Personal history of leukemia: Secondary | ICD-10-CM | POA: Diagnosis not present

## 2018-02-10 DIAGNOSIS — Z85828 Personal history of other malignant neoplasm of skin: Secondary | ICD-10-CM | POA: Insufficient documentation

## 2018-02-10 DIAGNOSIS — Z85820 Personal history of malignant melanoma of skin: Secondary | ICD-10-CM | POA: Insufficient documentation

## 2018-02-10 LAB — CMP (CANCER CENTER ONLY)
ALT: 28 U/L (ref 10–47)
AST: 26 U/L (ref 11–38)
Albumin: 4 g/dL (ref 3.5–5.0)
Alkaline Phosphatase: 71 U/L (ref 26–84)
Anion gap: 10 (ref 5–15)
BUN: 16 mg/dL (ref 7–22)
CO2: 33 mmol/L (ref 18–33)
Calcium: 9.6 mg/dL (ref 8.0–10.3)
Chloride: 97 mmol/L — ABNORMAL LOW (ref 98–108)
Creatinine: 1.2 mg/dL (ref 0.60–1.20)
Glucose, Bld: 101 mg/dL (ref 73–118)
Potassium: 4.2 mmol/L (ref 3.3–4.7)
Sodium: 140 mmol/L (ref 128–145)
Total Bilirubin: 0.9 mg/dL (ref 0.2–1.6)
Total Protein: 7 g/dL (ref 6.4–8.1)

## 2018-02-10 LAB — SAVE SMEAR

## 2018-02-10 LAB — CBC WITH DIFFERENTIAL (CANCER CENTER ONLY)
Basophils Absolute: 0.1 10*3/uL (ref 0.0–0.1)
Basophils Relative: 1 %
Eosinophils Absolute: 0.2 10*3/uL (ref 0.0–0.5)
Eosinophils Relative: 3 %
HCT: 45.8 % (ref 38.7–49.9)
Hemoglobin: 16 g/dL (ref 13.0–17.1)
Lymphocytes Relative: 29 %
Lymphs Abs: 2.1 10*3/uL (ref 0.9–3.3)
MCH: 33.1 pg (ref 28.0–33.4)
MCHC: 34.9 g/dL (ref 32.0–35.9)
MCV: 94.8 fL (ref 82.0–98.0)
Monocytes Absolute: 0.7 10*3/uL (ref 0.1–0.9)
Monocytes Relative: 10 %
Neutro Abs: 4.2 10*3/uL (ref 1.5–6.5)
Neutrophils Relative %: 57 %
Platelet Count: 189 10*3/uL (ref 145–400)
RBC: 4.83 MIL/uL (ref 4.20–5.70)
RDW: 12.3 % (ref 11.1–15.7)
WBC Count: 7.3 10*3/uL (ref 4.0–10.0)

## 2018-02-10 LAB — LACTATE DEHYDROGENASE: LDH: 184 U/L (ref 98–192)

## 2018-02-10 NOTE — Progress Notes (Signed)
Hematology and Oncology Follow Up Visit  Charles [name] [id number] [birth year] 62 y.o. 02/10/2018   Principle Diagnosis:  Chronic lymphocytic leukemia (Trisomy 12) - stage C - remission  Current Therapy:   Observation   Interim History:  Charles [name] is here today for follow-up. He is doing well. Since we last saw him he had a basal cell carcinoma in the right side of his nose as well as a melanoma removed from the middle abdomen by Mohs procedure. His incision sites have healed nicely. No redness or edema.  He is followed closely by dermatology Dr. Fontaine No.  His counts remain stable. WBC count 7.3, Hgb 16.0 and platelets 189.  No fever, chills, n/v, cough, rash, dizziness, SOB, chest pain, palpitations, abdominal pain or changes in bowel or bladder habits.  No swelling in his extremities at this time.  Sciatica from his back can cause him to have numbness and tingling in his lower extremities. He completed PT for strengthening. He denies any falls or syncopal episodes.  He has maintained a good appetite and is staying well hydrated. His weight is stable.   ECOG Performance Status: 0 - Asymptomatic  Medications:  Allergies as of 02/10/2018   No Known Allergies     Medication List        Accurate as of 02/10/18  2:05 PM. Always use your most recent med list.          aspirin 81 MG tablet Take 81 mg by mouth daily.   cetirizine 10 MG tablet Commonly known as:  ZYRTEC Take 10 mg by mouth daily.   esomeprazole 40 MG capsule Commonly known as:  NEXIUM Take 1 capsule (40 mg total) by mouth daily. Before bed   famciclovir 500 MG tablet Commonly known as:  FAMVIR Take 1 tablet (500 mg total) by mouth every morning.   lisinopril-hydrochlorothiazide 20-25 MG tablet Commonly known as:  PRINZIDE,ZESTORETIC Take 1 tablet by mouth daily.   meloxicam 15 MG tablet Commonly known as:  MOBIC Take 1 tablet (15 mg total) by mouth daily.   rosuvastatin 10 MG tablet Commonly  known as:  CRESTOR Take 1 tablet (10 mg total) by mouth every morning.   sildenafil 100 MG tablet Commonly known as:  VIAGRA Take 0.5 tablets (50 mg total) by mouth daily as needed for erectile dysfunction.   Vitamin D3 2000 units Tabs Take 2,000 Units by mouth.       Allergies: No Known Allergies  Past Medical History, Surgical history, Social history, and Family History were reviewed and updated.  Review of Systems: All other 10 point review of systems is negative.   Physical Exam:  weight is 248 lb (112.5 kg). His oral temperature is 98 F (36.7 C). His blood pressure is 126/77 and his pulse is 55 (abnormal). His respiration is 18 and oxygen saturation is 96%.   Wt Readings from Last 3 Encounters:  02/10/18 248 lb (112.5 kg)  09/13/17 250 lb (113.4 kg)  08/05/17 250 lb (113.4 kg)    Ocular: Sclerae unicteric, pupils equal, round and reactive to light Ear-nose-throat: Oropharynx clear, dentition fair Lymphatic: No cervical, supraclavicular or axillary adenopathy Lungs no rales or rhonchi, good excursion bilaterally Heart regular rate and rhythm, no murmur appreciated Abd soft, nontender, positive bowel sounds, no liver or spleen tip palpated on exam, no fluid wave  MSK no focal spinal tenderness, no joint edema Neuro: non-focal, well-oriented, appropriate affect Breasts: Deferred   Lab Results  Component Value Date   WBC  7.3 02/10/2018   HGB 16.0 02/10/2018   HCT 45.8 02/10/2018   MCV 94.8 02/10/2018   PLT 189 02/10/2018   No results found for: FERRITIN, IRON, TIBC, UIBC, IRONPCTSAT Lab Results  Component Value Date   RETICCTPCT 1.8 01/31/2011   RBC 4.83 02/10/2018   RETICCTABS 79.6 01/31/2011   No results found for: [facility] Lab Results  Component Value Date   IGGSERUM 708 12/27/2015   IGA 131 06/24/2015   IGMSERUM 53 12/27/2015   Lab Results  Component Value Date   TOTALPROTELP 6.4 04/23/2013   ALBUMINELP 60.9  04/23/2013   A1GS 6.7 (H) 04/23/2013   A2GS 11.5 04/23/2013   BETS 6.6 04/23/2013   BETA2SER 4.1 04/23/2013   GAMS 10.2 (L) 04/23/2013   MSPIKE Not Observed 12/27/2015   SPEI * 04/23/2013     Chemistry      Component Value Date/Time   NA 140 02/10/2018 1106   NA 140 08/05/2017 1115   NA 139 06/26/2016 1000   K 4.2 02/10/2018 1106   K 4.4 06/26/2016 1000   CL 97 (L) 02/10/2018 1106   CL 102 04/10/2012 0815   CO2 33 02/10/2018 1106   CO2 27 06/26/2016 1000   BUN 16 02/10/2018 1106   BUN 16 08/05/2017 1115   BUN 13.5 06/26/2016 1000   CREATININE 1.20 02/10/2018 1106   CREATININE 1.1 06/26/2016 1000      Component Value Date/Time   CALCIUM 9.6 02/10/2018 1106   CALCIUM 9.5 06/26/2016 1000   ALKPHOS 71 02/10/2018 1106   ALKPHOS 83 06/26/2016 1000   AST 26 02/10/2018 1106   AST 20 06/26/2016 1000   ALT 28 02/10/2018 1106   ALT 19 06/26/2016 1000   BILITOT 0.9 02/10/2018 1106   BILITOT 0.69 06/26/2016 1000      Impression and Plan: Charles [name] is a very pleasant 62 yo caucasian male with CLL. He completed 6 cycles of chemotherapy with Rituxan/Treanda in February 2014. So far her has done well and there has been no evidence of recurrence.  He continues to do well and is being followed closely by his dermatologist for his recent diagnosis of melanoma.  We will go ahead and plan to see him back in another year for follow-up.  He will contact our office with any questions or concerns. We can certainly see him sooner if need be.   Laverna Peace, NP 7/22/20192:05 PM

## 2018-02-25 ENCOUNTER — Other Ambulatory Visit: Payer: Self-pay | Admitting: Podiatry

## 2018-02-25 MED FILL — LISINOPRIL-HCTZ 20-25 MG TA: 20-25 | 90 days supply | Qty: 90 | Fill #2

## 2018-02-26 ENCOUNTER — Encounter: Payer: Self-pay | Admitting: Podiatry

## 2018-02-28 MED ORDER — MELOXICAM 15 MG PO TABS: 15.0000 mg | ORAL_TABLET | Freq: Every day | ORAL | 0 refills | Status: DC

## 2018-02-28 MED FILL — MELOXICAM 15 MG TABLET: 15 | 30 days supply | Qty: 30 | Fill #0

## 2018-02-28 NOTE — Telephone Encounter (Signed)
Dr. Milinda Pointer ordered one refill of the Meloxicam and stated inform pt it is [facility] policy to see pt after one year prior to refills.

## 2018-04-01 ENCOUNTER — Encounter: Payer: Self-pay | Admitting: Podiatry

## 2018-04-01 ENCOUNTER — Ambulatory Visit: Payer: 59 | Admitting: Podiatry

## 2018-04-01 DIAGNOSIS — M722 Plantar fascial fibromatosis: Secondary | ICD-10-CM

## 2018-04-01 MED ORDER — MELOXICAM 15 MG PO TABS: 15.0000 mg | ORAL_TABLET | Freq: Every day | ORAL | 11 refills | Status: DC

## 2018-04-01 MED FILL — MELOXICAM 15 MG TABLET: 15 | 90 days supply | Qty: 90 | Fill #0

## 2018-04-01 NOTE — Progress Notes (Signed)
He presents today for follow-up of his plantar fasciitis right foot.  States that the meloxicam orthotics and shoe gear seems to make a big difference in his pain level.  Objective: Vital signs are stable he is alert and oriented x3.  Pulses are palpable bilateral lower extremity.  Right lower extremity demonstrates no pain on palpation medial calcaneal tubercle of the right heel.  Neurologic sensorium is intact degenerative flexors are intact muscle strength is normal.  Assessment: Plantar fasciitis well-controlled.  Plan: Recommend continue all conservative therapies refilled meloxicam.  Follow-up with him in 1 year

## 2018-04-28 MED FILL — ESOMEPRAZOLE MAG DR 40 MG C: 40 | 90 days supply | Qty: 90 | Fill #2

## 2018-05-22 MED FILL — ROSUVASTATIN CALCIUM 10 MG: 10 | 90 days supply | Qty: 90 | Fill #2

## 2018-05-22 MED FILL — LISINOPRIL-HCTZ 20-25 MG TA: 20-25 | 90 days supply | Qty: 90 | Fill #3

## 2018-05-22 MED FILL — FAMCICLOVIR 500 MG TABLET: 500 | 90 days supply | Qty: 90 | Fill #2

## 2018-06-23 MED FILL — MELOXICAM 15 MG TABLET: 15 | 90 days supply | Qty: 90 | Fill #1

## 2018-07-21 MED FILL — ESOMEPRAZOLE MAG DR 40 MG C: 40 | 90 days supply | Qty: 90 | Fill #3

## 2018-08-22 ENCOUNTER — Encounter: Payer: Self-pay | Admitting: Hematology & Oncology

## 2018-08-22 ENCOUNTER — Other Ambulatory Visit: Payer: Self-pay | Admitting: *Deleted

## 2018-08-22 DIAGNOSIS — C9110 Chronic lymphocytic leukemia of B-cell type not having achieved remission: Secondary | ICD-10-CM

## 2018-08-22 MED ORDER — FAMCICLOVIR 500 MG PO TABS: 500.0000 mg | ORAL_TABLET | Freq: Every morning | ORAL | 3 refills | Status: DC

## 2018-08-22 MED FILL — FAMCICLOVIR 500 MG TABLET: 500 | 90 days supply | Qty: 90 | Fill #0

## 2018-08-22 MED FILL — ROSUVASTATIN CALCIUM 10 MG: 10 | 7 days supply | Qty: 7 | Fill #0

## 2018-08-22 MED FILL — LISINOPRIL-HCTZ 20-25 MG TA: 20-25 | 7 days supply | Qty: 7 | Fill #0

## 2018-08-28 DIAGNOSIS — K219 Gastro-esophageal reflux disease without esophagitis: Secondary | ICD-10-CM | POA: Diagnosis not present

## 2018-08-28 DIAGNOSIS — R7303 Prediabetes: Secondary | ICD-10-CM | POA: Diagnosis not present

## 2018-08-28 DIAGNOSIS — I10 Essential (primary) hypertension: Secondary | ICD-10-CM | POA: Diagnosis not present

## 2018-08-28 DIAGNOSIS — Z8619 Personal history of other infectious and parasitic diseases: Secondary | ICD-10-CM | POA: Diagnosis not present

## 2018-08-28 DIAGNOSIS — Z8042 Family history of malignant neoplasm of prostate: Secondary | ICD-10-CM | POA: Insufficient documentation

## 2018-08-28 DIAGNOSIS — Z0000 Encounter for general adult medical examination without abnormal findings: Secondary | ICD-10-CM | POA: Diagnosis not present

## 2018-08-28 DIAGNOSIS — E7800 Pure hypercholesterolemia, unspecified: Secondary | ICD-10-CM | POA: Diagnosis not present

## 2018-08-28 DIAGNOSIS — Z125 Encounter for screening for malignant neoplasm of prostate: Secondary | ICD-10-CM | POA: Diagnosis not present

## 2018-08-28 DIAGNOSIS — K089 Disorder of teeth and supporting structures, unspecified: Secondary | ICD-10-CM | POA: Insufficient documentation

## 2018-08-28 DIAGNOSIS — C9110 Chronic lymphocytic leukemia of B-cell type not having achieved remission: Secondary | ICD-10-CM | POA: Diagnosis not present

## 2018-08-28 DIAGNOSIS — Z1211 Encounter for screening for malignant neoplasm of colon: Secondary | ICD-10-CM | POA: Diagnosis not present

## 2018-08-28 MED FILL — ROSUVASTATIN CALCIUM 10 MG: 10 | 90 days supply | Qty: 90 | Fill #0 | Status: TO

## 2018-08-28 MED FILL — LISINOPRIL-HCTZ 20-25 MG TA: 20-25 | 90 days supply | Qty: 90 | Fill #0 | Status: TO

## 2018-09-22 MED FILL — MELOXICAM 15 MG TABLET: 15 | 90 days supply | Qty: 90 | Fill #2

## 2018-10-20 MED FILL — ESOMEPRAZOLE MAG DR 40 MG C: 40 | 90 days supply | Qty: 90 | Fill #0 | Status: TO

## 2018-11-17 MED FILL — LISINOPRIL-HCTZ 20-25 MG TA: 20-25 | 90 days supply | Qty: 90 | Fill #0 | Status: TO

## 2018-11-17 MED FILL — ROSUVASTATIN CALCIUM 10 MG: 10 | 90 days supply | Qty: 90 | Fill #0 | Status: TO

## 2018-11-17 MED FILL — FAMCICLOVIR 500 MG TABLET: 500 | 30 days supply | Qty: 30 | Fill #0 | Status: TO

## 2018-12-18 MED FILL — MELOXICAM 15 MG TABLET: 15 | 90 days supply | Qty: 90 | Fill #3

## 2018-12-18 MED FILL — FAMCICLOVIR 500 MG TABLET: 500 | 30 days supply | Qty: 30 | Fill #0

## 2018-12-31 MED FILL — ESOMEPRAZOLE MAG DR 40 MG C: 40 | 90 days supply | Qty: 90 | Fill #0

## 2019-01-14 DIAGNOSIS — Z1211 Encounter for screening for malignant neoplasm of colon: Secondary | ICD-10-CM | POA: Diagnosis not present

## 2019-01-19 MED FILL — FAMCICLOVIR 500 MG TABLET: 500 | 30 days supply | Qty: 30 | Fill #1

## 2019-02-11 ENCOUNTER — Encounter: Payer: Self-pay | Admitting: Hematology & Oncology

## 2019-02-11 ENCOUNTER — Inpatient Hospital Stay: Payer: 59 | Attending: Hematology & Oncology | Admitting: Hematology & Oncology

## 2019-02-11 ENCOUNTER — Other Ambulatory Visit: Payer: Self-pay

## 2019-02-11 ENCOUNTER — Inpatient Hospital Stay: Payer: 59

## 2019-02-11 VITALS — BP 131/77 | HR 63 | Temp 98.2°F | Resp 20 | Wt 260.0 lb

## 2019-02-11 DIAGNOSIS — C9111 Chronic lymphocytic leukemia of B-cell type in remission: Secondary | ICD-10-CM

## 2019-02-11 DIAGNOSIS — C9110 Chronic lymphocytic leukemia of B-cell type not having achieved remission: Secondary | ICD-10-CM

## 2019-02-11 LAB — CBC WITH DIFFERENTIAL (CANCER CENTER ONLY)
Abs Immature Granulocytes: 0.02 10*3/uL (ref 0.00–0.07)
Basophils Absolute: 0.1 10*3/uL (ref 0.0–0.1)
Basophils Relative: 1 %
Eosinophils Absolute: 0.2 10*3/uL (ref 0.0–0.5)
Eosinophils Relative: 2 %
HCT: 44.1 % (ref 39.0–52.0)
Hemoglobin: 15.4 g/dL (ref 13.0–17.0)
Immature Granulocytes: 0 %
Lymphocytes Relative: 34 %
Lymphs Abs: 2.6 10*3/uL (ref 0.7–4.0)
MCH: 32.7 pg (ref 26.0–34.0)
MCHC: 34.9 g/dL (ref 30.0–36.0)
MCV: 93.6 fL (ref 80.0–100.0)
Monocytes Absolute: 0.7 10*3/uL (ref 0.1–1.0)
Monocytes Relative: 9 %
Neutro Abs: 4.2 10*3/uL (ref 1.7–7.7)
Neutrophils Relative %: 54 %
Platelet Count: 194 10*3/uL (ref 150–400)
RBC: 4.71 MIL/uL (ref 4.22–5.81)
RDW: 12 % (ref 11.5–15.5)
WBC Count: 7.7 10*3/uL (ref 4.0–10.5)
nRBC: 0 % (ref 0.0–0.2)

## 2019-02-11 LAB — CMP (CANCER CENTER ONLY)
ALT: 18 U/L (ref 0–44)
AST: 22 U/L (ref 15–41)
Albumin: 4.4 g/dL (ref 3.5–5.0)
Alkaline Phosphatase: 71 U/L (ref 38–126)
Anion gap: 9 (ref 5–15)
BUN: 13 mg/dL (ref 8–23)
CO2: 28 mmol/L (ref 22–32)
Calcium: 8.8 mg/dL — ABNORMAL LOW (ref 8.9–10.3)
Chloride: 100 mmol/L (ref 98–111)
Creatinine: 1.03 mg/dL (ref 0.61–1.24)
GFR, Est AFR Am: >60 mL/min (ref >60)
GFR, Estimated: >60 mL/min (ref >60)
Glucose, Bld: 105 mg/dL — ABNORMAL HIGH (ref 70–99)
Potassium: 3.7 mmol/L (ref 3.5–5.1)
Sodium: 137 mmol/L (ref 135–145)
Total Bilirubin: 0.7 mg/dL (ref 0.3–1.2)
Total Protein: 6.7 g/dL (ref 6.5–8.1)

## 2019-02-11 LAB — SAVE SMEAR (SSMR)

## 2019-02-11 NOTE — Progress Notes (Signed)
Hematology and Oncology Follow Up Visit  Charles [name] [id number] [birth year] 63 y.o. 02/11/2019   Principle Diagnosis:  Chronic lymphocytic leukemia (Trisomy 12) - stage C - remission  Current Therapy:   Observation   Interim History:  Charles [name] is here today for follow-up.,  He is doing quite well.  He saw him a year ago.  Since then, he has had no problems.  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  He was very much involved with the opening of the new women's center over at [facility].  Apparently, younger brother has metastatic colon cancer.  He is taking chemotherapy right now.  There is been no problem with infections.  He has had no rashes.  He has had no cough or shortness of breath.  There has been no change in bowel or bladder habits.  He has not noted any swollen lymph nodes.  Overall, his performance status is ECOG 0.    Medications:  Allergies as of 02/11/2019   No Known Allergies     Medication List       Accurate as of February 11, 2019 11:56 AM. If you have any questions, ask your nurse or doctor.        aspirin 81 MG tablet Take 81 mg by mouth daily.   cetirizine 10 MG tablet Commonly known as: ZYRTEC Take 10 mg by mouth daily.   esomeprazole 40 MG capsule Commonly known as: NEXIUM Take 1 capsule (40 mg total) by mouth daily. Before bed   famciclovir 500 MG tablet Commonly known as: FAMVIR Take 1 tablet (500 mg total) by mouth every morning.   lisinopril-hydrochlorothiazide 20-25 MG tablet Commonly known as: ZESTORETIC Take 1 tablet by mouth daily.   meloxicam 15 MG tablet Commonly known as: MOBIC Take 1 tablet (15 mg total) by mouth daily.   rosuvastatin 10 MG tablet Commonly known as: CRESTOR Take 1 tablet (10 mg total) by mouth every morning.   sildenafil 100 MG tablet Commonly known as: Viagra Take 0.5 tablets (50 mg total) by mouth daily as needed for erectile dysfunction.   Vitamin D3 50 MCG (2000 UT) Tabs Take 2,000  Units by mouth.       Allergies: No Known Allergies  Past Medical History, Surgical history, Social history, and Family History were reviewed and updated.  Review of Systems: Review of Systems  Constitutional: Negative.   HENT: Negative.   Eyes: Negative.   Cardiovascular: Negative.   Gastrointestinal: Negative.   Genitourinary: Negative.   Musculoskeletal: Negative.   Skin: Negative.   Neurological: Negative.   Endo/Heme/Allergies: Negative.   Psychiatric/Behavioral: Negative.      Physical Exam:  vitals were not taken for this visit.   Wt Readings from Last 3 Encounters:  02/10/18 248 lb (112.5 kg)  09/13/17 250 lb (113.4 kg)  08/05/17 250 lb (113.4 kg)    Physical Exam Vitals signs reviewed.  HENT:     Head: Normocephalic and atraumatic.  Eyes:     Pupils: Pupils are equal, round, and reactive to light.  Neck:     Musculoskeletal: Normal range of motion.  Cardiovascular:     Rate and Rhythm: Normal rate and regular rhythm.     Heart sounds: Normal heart sounds.  Pulmonary:     Effort: Pulmonary effort is normal.     Breath sounds: Normal breath sounds.  Abdominal:     General: Bowel sounds are normal.     Palpations: Abdomen is soft.  Musculoskeletal: Normal  range of motion.        General: No tenderness or deformity.  Lymphadenopathy:     Cervical: No cervical adenopathy.  Skin:    General: Skin is warm and dry.     Findings: No erythema or rash.  Neurological:     Mental Status: He is alert and oriented to person, place, and time.  Psychiatric:        Behavior: Behavior normal.        Thought Content: Thought content normal.        Judgment: Judgment normal.       Lab Results  Component Value Date   WBC 7.7 02/11/2019   HGB 15.4 02/11/2019   HCT 44.1 02/11/2019   MCV 93.6 02/11/2019   PLT 194 02/11/2019   No results found for: FERRITIN, IRON, TIBC, UIBC, IRONPCTSAT Lab Results  Component Value Date   RETICCTPCT 1.8 01/31/2011   RBC  4.71 02/11/2019   RETICCTABS 79.6 01/31/2011   No results found for: [facility] Lab Results  Component Value Date   IGGSERUM 708 12/27/2015   IGA 131 06/24/2015   IGMSERUM 53 12/27/2015   Lab Results  Component Value Date   TOTALPROTELP 6.4 04/23/2013   ALBUMINELP 60.9 04/23/2013   A1GS 6.7 (H) 04/23/2013   A2GS 11.5 04/23/2013   BETS 6.6 04/23/2013   BETA2SER 4.1 04/23/2013   GAMS 10.2 (L) 04/23/2013   MSPIKE Not Observed 12/27/2015   SPEI * 04/23/2013     Chemistry      Component Value Date/Time   NA 140 02/10/2018 1106   NA 140 08/05/2017 1115   NA 139 06/26/2016 1000   K 4.2 02/10/2018 1106   K 4.4 06/26/2016 1000   CL 97 (L) 02/10/2018 1106   CL 102 04/10/2012 0815   CO2 33 02/10/2018 1106   CO2 27 06/26/2016 1000   BUN 16 02/10/2018 1106   BUN 16 08/05/2017 1115   BUN 13.5 06/26/2016 1000   CREATININE 1.20 02/10/2018 1106   CREATININE 1.1 06/26/2016 1000      Component Value Date/Time   CALCIUM 9.6 02/10/2018 1106   CALCIUM 9.5 06/26/2016 1000   ALKPHOS 71 02/10/2018 1106   ALKPHOS 83 06/26/2016 1000   AST 26 02/10/2018 1106   AST 20 06/26/2016 1000   ALT 28 02/10/2018 1106   ALT 19 06/26/2016 1000   BILITOT 0.9 02/10/2018 1106   BILITOT 0.69 06/26/2016 1000      Impression and Plan: Charles [name] is a very pleasant 63 yo caucasian male with CLL.   He completed 6 cycles of chemotherapy with Rituxan/Treanda in February 2014. So far, he has done well and there has been no evidence of recurrence.   He continues to do well and is being followed closely by his dermatologist .  We will plan to see him back in another year.  Volanda Napoleon, MD 7/22/202011:56 AM

## 2019-02-16 MED FILL — LISINOPRIL-HCTZ 20-25 MG TA: 20-25 | 90 days supply | Qty: 90 | Fill #0

## 2019-02-16 MED FILL — ROSUVASTATIN CALCIUM 10 MG: 10 | 90 days supply | Qty: 90 | Fill #0

## 2019-02-16 MED FILL — FAMCICLOVIR 500 MG TABLET: 500 | 30 days supply | Qty: 30 | Fill #2

## 2019-02-19 ENCOUNTER — Ambulatory Visit: Payer: 59 | Admitting: Podiatry

## 2019-02-19 ENCOUNTER — Encounter: Payer: Self-pay | Admitting: Podiatry

## 2019-02-19 ENCOUNTER — Other Ambulatory Visit: Payer: Self-pay

## 2019-02-19 ENCOUNTER — Ambulatory Visit (INDEPENDENT_AMBULATORY_CARE_PROVIDER_SITE_OTHER): Payer: 59

## 2019-02-19 VITALS — Temp 97.9°F

## 2019-02-19 DIAGNOSIS — M7752 Other enthesopathy of left foot: Secondary | ICD-10-CM

## 2019-02-19 DIAGNOSIS — M7750 Other enthesopathy of unspecified foot: Secondary | ICD-10-CM

## 2019-02-19 DIAGNOSIS — M76812 Anterior tibial syndrome, left leg: Secondary | ICD-10-CM | POA: Diagnosis not present

## 2019-02-19 DIAGNOSIS — Z8619 Personal history of other infectious and parasitic diseases: Secondary | ICD-10-CM | POA: Insufficient documentation

## 2019-02-19 DIAGNOSIS — H9190 Unspecified hearing loss, unspecified ear: Secondary | ICD-10-CM | POA: Insufficient documentation

## 2019-02-19 NOTE — Progress Notes (Signed)
He presents today for follow-up of his first metatarsal phalangeal joint and midfoot and ankle left.  States is been aching for about a week now is better after getting a new pair shoes stating that the worst part of it is right here as he points to the distal end of the tibialis anterior tendon left.  Objective: Vital signs are stable alert oriented x3.  Pulses are strongly palpable.  He has pain on inversion against resistance on tenderness on palpation of the plantar fascia.  Tibialis anterior tendon is painful distally and there is some fluctuance at the insertion site.  Radiographs taken today do not demonstrate any acute findings.  Assessment: Tibialis anterior tendinitis resolving.  Plan: At this point I encouraged him to continue the use of his regular new shoes ice therapy compression therapy.  I offered him an injection and medication he declined.

## 2019-03-23 ENCOUNTER — Other Ambulatory Visit: Payer: Self-pay | Admitting: Podiatry

## 2019-03-23 MED FILL — FAMCICLOVIR 500 MG TABLET: 500 | 30 days supply | Qty: 30 | Fill #3

## 2019-03-23 MED FILL — MELOXICAM 15 MG TABLET: 15 | 90 days supply | Qty: 90 | Fill #0

## 2019-04-22 MED FILL — FAMCICLOVIR 500 MG TABLET: 500 | 60 days supply | Qty: 60 | Fill #4

## 2019-04-22 MED FILL — ESOMEPRAZOLE MAG DR 40 MG C: 40 | 90 days supply | Qty: 90 | Fill #1

## 2019-05-25 MED FILL — LISINOPRIL-HCTZ 20-25 MG TA: 20-25 | 90 days supply | Qty: 90 | Fill #1

## 2019-05-25 MED FILL — ROSUVASTATIN CALCIUM 10 MG: 10 | 90 days supply | Qty: 90 | Fill #1

## 2019-06-25 MED FILL — FAMCICLOVIR 500 MG TABLET: 500 | 60 days supply | Qty: 60 | Fill #5

## 2019-06-26 MED FILL — MELOXICAM 15 MG TABLET: 15 | 90 days supply | Qty: 90 | Fill #1

## 2019-07-20 MED FILL — ESOMEPRAZOLE MAG DR 40 MG C: 40 | 90 days supply | Qty: 90 | Fill #2

## 2019-08-24 ENCOUNTER — Other Ambulatory Visit: Payer: Self-pay | Admitting: Hematology & Oncology

## 2019-08-24 DIAGNOSIS — C9110 Chronic lymphocytic leukemia of B-cell type not having achieved remission: Secondary | ICD-10-CM

## 2019-08-24 MED FILL — ROSUVASTATIN CALCIUM 10 MG: 10 | 90 days supply | Qty: 90 | Fill #0

## 2019-08-24 MED FILL — LISINOPRIL-HCTZ 20-25 MG TA: 20-25 | 90 days supply | Qty: 90 | Fill #0

## 2019-08-24 MED FILL — FAMCICLOVIR 500 MG TABLET: 500 | 60 days supply | Qty: 60 | Fill #0

## 2019-08-31 DIAGNOSIS — C4490 Unspecified malignant neoplasm of skin, unspecified: Secondary | ICD-10-CM | POA: Insufficient documentation

## 2019-08-31 DIAGNOSIS — I10 Essential (primary) hypertension: Secondary | ICD-10-CM | POA: Diagnosis not present

## 2019-08-31 DIAGNOSIS — R7303 Prediabetes: Secondary | ICD-10-CM | POA: Diagnosis not present

## 2019-08-31 DIAGNOSIS — E7800 Pure hypercholesterolemia, unspecified: Secondary | ICD-10-CM | POA: Diagnosis not present

## 2019-08-31 DIAGNOSIS — K219 Gastro-esophageal reflux disease without esophagitis: Secondary | ICD-10-CM | POA: Diagnosis not present

## 2019-08-31 DIAGNOSIS — Z125 Encounter for screening for malignant neoplasm of prostate: Secondary | ICD-10-CM | POA: Diagnosis not present

## 2019-08-31 DIAGNOSIS — M722 Plantar fascial fibromatosis: Secondary | ICD-10-CM | POA: Diagnosis not present

## 2019-08-31 DIAGNOSIS — C9110 Chronic lymphocytic leukemia of B-cell type not having achieved remission: Secondary | ICD-10-CM | POA: Diagnosis not present

## 2019-08-31 DIAGNOSIS — Z0000 Encounter for general adult medical examination without abnormal findings: Secondary | ICD-10-CM | POA: Diagnosis not present

## 2019-08-31 MED FILL — KETOCONAZOLE 2% CREAM: 2 | 15 days supply | Qty: 30 | Fill #0

## 2019-09-07 MED FILL — MELOXICAM 15 MG TABLET: 15 | 30 days supply | Qty: 90 | Fill #0

## 2019-09-14 MED FILL — DOXYCYCLINE MONO 100 MG TAB: 100 | 7 days supply | Qty: 14 | Fill #0

## 2019-10-12 MED FILL — ESOMEPRAZOLE MAG DR 40 MG C: 40 | 90 days supply | Qty: 90 | Fill #0

## 2019-10-19 MED FILL — FAMCICLOVIR 500 MG TABLET: 500 | 60 days supply | Qty: 60 | Fill #1

## 2019-10-27 ENCOUNTER — Other Ambulatory Visit: Payer: Self-pay | Admitting: Family Medicine

## 2019-10-27 ENCOUNTER — Ambulatory Visit: Payer: Self-pay

## 2019-10-27 ENCOUNTER — Other Ambulatory Visit: Payer: Self-pay

## 2019-10-27 DIAGNOSIS — M25562 Pain in left knee: Secondary | ICD-10-CM

## 2019-10-28 DIAGNOSIS — Z85820 Personal history of malignant melanoma of skin: Secondary | ICD-10-CM | POA: Diagnosis not present

## 2019-10-28 DIAGNOSIS — L570 Actinic keratosis: Secondary | ICD-10-CM | POA: Diagnosis not present

## 2019-10-28 DIAGNOSIS — Z85828 Personal history of other malignant neoplasm of skin: Secondary | ICD-10-CM | POA: Diagnosis not present

## 2019-10-28 MED FILL — FLUOROURACIL 5 % CREA: 5 | 14 days supply | Qty: 40 | Fill #0

## 2019-10-29 ENCOUNTER — Encounter: Payer: Self-pay | Admitting: Hematology & Oncology

## 2019-11-11 ENCOUNTER — Encounter: Payer: Self-pay | Admitting: Orthopaedic Surgery

## 2019-11-11 ENCOUNTER — Other Ambulatory Visit: Payer: Self-pay

## 2019-11-11 ENCOUNTER — Ambulatory Visit: Payer: 59 | Admitting: Orthopaedic Surgery

## 2019-11-11 DIAGNOSIS — M25562 Pain in left knee: Secondary | ICD-10-CM | POA: Diagnosis not present

## 2019-11-11 MED ORDER — METHYLPREDNISOLONE ACETATE 40 MG/ML IJ SUSP
40.0000 mg | INTRAMUSCULAR | Status: AC | PRN
  Administered 2019-11-11: 40 mg via INTRA_ARTICULAR

## 2019-11-11 MED ORDER — LIDOCAINE HCL 1 % IJ SOLN
3.0000 mL | INTRAMUSCULAR | Status: AC | PRN
  Administered 2019-11-11: 10:00:00 3 mL

## 2019-11-11 NOTE — Progress Notes (Signed)
Office Visit Note   Patient: Charles [name]           Date of Birth: 08/01/1955           MRN: [id number] Visit Date: 11/11/2019              Requested by: Shawnee Knapp, MD [place],  [place] [id number] PCP: Shawnee Knapp, MD   Assessment & Plan: Visit Diagnoses:  1. Acute pain of left knee     Plan:  We will see him back in 2 weeks to see how he does with the cortisone injection.  He is to be mindful of any mechanical symptoms of the knee.  He is to avoid any pivoting activities.  He shown quad strengthening exercises which she will perform.  Questions were encouraged and answered by Dr. Ninfa Linden myself.  Follow-Up Instructions: Return in about 2 weeks (around 11/25/2019).   Orders:  Orders Placed This Encounter  Procedures  . Large Joint Inj: L knee   No orders of the defined types were placed in this encounter.     Procedures: Large Joint Inj: L knee on 11/11/2019 10:11 AM Indications: pain Details: 22 G 1.5 in needle, anterolateral approach  Arthrogram: No  Medications: 3 mL lidocaine 1 %; 40 mg methylPREDNISolone acetate 40 MG/ML Outcome: tolerated well, no immediate complications Procedure, treatment alternatives, risks and benefits explained, specific risks discussed. Consent was given by the patient. Immediately prior to procedure a time out was called to verify the correct patient, procedure, equipment, support staff and site/side marked as required. Patient was prepped and draped in the usual sterile fashion.       Clinical Data: No additional findings.   Subjective: Chief Complaint  Patient presents with  . Left Knee - Pain    HPI Charles [name] 64 year old male were seen for the first time.  He comes in today due to left knee pain that started on April 4 while at work.  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  So as he was stepping up 22 inches with his right leg pushing off with his left leg he had severe pain in the left knee.  He notes that he had a pop in his knee when the pain occurred.  He was seen at health at work who told him that he had a sprain.  Since that time he continues to have pain in the left knee medial aspect.  He feels as if the knee buckles.  He does have some locking whenever he goes to for stand up and ambulate.  No additional painful popping.  No prior knee pain before April 4 in the left knee.  Does have a history of right knee arthroscopy in 2004 2005 by Dr. Theda Sers.  He has tried relative rest and a cane.  Denies any swelling of the knee.  States that [employer] denied his claim due to the fact that he had no fall at the onset of the knee pain. Radiographs left knee are reviewed these are dated April 6 which showed  mild to moderate medial joint line narrowing.  Mild patellofemoral changes.  Chondrocalcinosis is noted on the medial lateral meniscus.  No acute fractures.  Review of Systems  Constitutional: Negative for chills and fever.  Respiratory: Negative for  shortness of breath.   Cardiovascular: Negative for chest pain.  See HPI    Objective: Vital Signs: There were no vitals taken for this visit.  Physical Exam Constitutional:      Appearance: He is not ill-appearing or diaphoretic.  Pulmonary:     Effort: Pulmonary effort is normal.  Neurological:     Mental Status: He is alert and oriented to person, place, and time.  Psychiatric:        Mood and Affect: Mood normal.     Ortho Exam Bilateral knees good range of motion.  Tenderness left medial and medial joint line.  No instability valgus varus stressing of either knee.  Positive McMurray's left knee.  Right knee negative McMurray's.  No abnormal warmth erythema or effusion of either knee.  Specialty Comments:  No specialty comments available.  Imaging: No results found.   PMFS  History: Patient Active Problem List   Diagnosis Date Noted  . Hearing loss 02/19/2019  . History of shingles 02/19/2019  . Family history of prostate cancer 08/28/2018  . Poor dentition 08/28/2018  . Essential hypertension, benign 08/02/2016  . Pre-diabetes 07/14/2015  . Obesity 07/14/2015  . GERD (gastroesophageal reflux disease) 12/25/2012  . Pure hypercholesterolemia 12/25/2012  . CLL (chronic lymphocytic leukemia) ([place]) 03/18/2012   Past Medical History:  Diagnosis Date  . Allergy   . Arthritis   . CLL (chronic lymphoblastic leukemia) 08/03/2011  . CLL (chronic lymphocytic leukemia) ([place]) 03/18/2012  . GERD (gastroesophageal reflux disease)   . Shingles   . Ulcer     Family History  Problem Relation Age of Onset  . Cancer Brother 29       prostate cancer stage 4  . Hyperlipidemia Father   . Cancer Brother 18       prostate  . Cancer Sister   . Breast cancer Sister   . Diabetes Mother   . Melanoma Sister   . Cancer Brother   . Cancer - Colon Brother     Past Surgical History:  Procedure Laterality Date  . HERNIA REPAIR    . KNEE SURGERY    . SHOULDER SURGERY    . WRIST SURGERY     Social History   Occupational History  . Occupation: [employer]  Tobacco Use  . Smoking status: Former Smoker    Packs/day: 2.00    Years: 20.00    Pack years: 40.00    Types: Cigarettes    Start date: 08/24/1978    Quit date: 08/24/1998    Years since quitting: 21.2  . Smokeless tobacco: Never Used  . Tobacco comment: quit smoking 15 years ago  Substance and Sexual Activity  . Alcohol use: Yes    Alcohol/week: 0.0 standard drinks    Comment: 1 a week  . Drug use: No  . Sexual activity: Yes    Partners: Female    Birth control/protection: None

## 2019-11-23 MED FILL — ROSUVASTATIN CALCIUM 10 MG: 10 | 90 days supply | Qty: 90 | Fill #1

## 2019-11-23 MED FILL — LISINOPRIL-HCTZ 20-25 MG TA: 20-25 | 90 days supply | Qty: 90 | Fill #1

## 2019-11-25 ENCOUNTER — Ambulatory Visit: Payer: 59 | Admitting: Orthopaedic Surgery

## 2019-11-25 ENCOUNTER — Other Ambulatory Visit: Payer: Self-pay

## 2019-11-25 ENCOUNTER — Encounter: Payer: Self-pay | Admitting: Orthopaedic Surgery

## 2019-11-25 DIAGNOSIS — M25562 Pain in left knee: Secondary | ICD-10-CM

## 2019-11-25 NOTE — Progress Notes (Signed)
Office Visit Note   Patient: Charles [name]           Date of Birth: 1956-01-24           MRN: [id number] Visit Date: 11/25/2019              Requested by: Shawnee Knapp, MD [place],  [place] [address] PCP: Shawnee Knapp, MD   Assessment & Plan: Visit Diagnoses:  1. Acute pain of left knee     Plan: We will get an MRI of his left knee to rule out meniscal tear.  Have him follow-up after the MRI to go over results and discuss further treatment.  Questions were encouraged and answered by Dr. Ninfa Linden myself.  Follow-Up Instructions: No follow-ups on file.   Orders:  No orders of the defined types were placed in this encounter.  No orders of the defined types were placed in this encounter.     Procedures: No procedures performed   Clinical Data: No additional findings.   Subjective: Chief Complaint  Patient presents with  . Left Knee - Pain    HPI Charles [name] returns today 2 weeks status post injection left knee.  States knee is better but he still has to watch how he steps stated that his knee feels like it is going to give way.  Still having pain along the medial joint line.  If he walks too long he has shooting pain in the knee.  He denies any swelling in the knee.  Does feel knee somewhat locks up if he sits too long.  Has been taking Tylenol.  He continues to work just refraining from any ladders.  Review of Systems See HPI otherwise negative  Objective: Vital Signs: There were no vitals taken for this visit.  Physical Exam Constitutional:      Appearance: He is not ill-appearing or diaphoretic.  Neurological:     Mental Status: He is alert and oriented to person, place, and time.  Psychiatric:        Mood and Affect: Mood normal.     Ortho Exam Bilateral knees no effusion or erythema.  Positive McMurray's left knee.  Tenderness along medial joint line left knee.  No instability valgus varus stressing.  Deep flexion of the left knee causes  pain medial joint line. Specialty Comments:  No specialty comments available.  Imaging: No results found.   PMFS History: Patient Active Problem List   Diagnosis Date Noted  . Hearing loss 02/19/2019  . History of shingles 02/19/2019  . Family history of prostate cancer 08/28/2018  . Poor dentition 08/28/2018  . Essential hypertension, benign 08/02/2016  . Pre-diabetes 07/14/2015  . Obesity 07/14/2015  . GERD (gastroesophageal reflux disease) 12/25/2012  . Pure hypercholesterolemia 12/25/2012  . CLL (chronic lymphocytic leukemia) ([place]) 03/18/2012   Past Medical History:  Diagnosis Date  . Allergy   . Arthritis   . CLL (chronic lymphoblastic leukemia) 08/03/2011  . CLL (chronic lymphocytic leukemia) ([place]) 03/18/2012  . GERD (gastroesophageal reflux disease)   . Shingles   . Ulcer     Family History  Problem Relation Age of Onset  . Cancer Brother 33       prostate cancer stage 4  . Hyperlipidemia Father   . Cancer Brother 75       prostate  . Cancer Sister   . Breast cancer Sister   . Diabetes Mother   . Melanoma Sister   . Cancer Brother   .  Cancer - Colon Brother     Past Surgical History:  Procedure Laterality Date  . HERNIA REPAIR    . KNEE SURGERY    . SHOULDER SURGERY    . WRIST SURGERY     Social History   Occupational History  . Occupation: [employer]  Tobacco Use  . Smoking status: Former Smoker    Packs/day: 2.00    Years: 20.00    Pack years: 40.00    Types: Cigarettes    Start date: 08/24/1978    Quit date: 08/24/1998    Years since quitting: 21.2  . Smokeless tobacco: Never Used  . Tobacco comment: quit smoking 15 years ago  Substance and Sexual Activity  . Alcohol use: Yes    Alcohol/week: 0.0 standard drinks    Comment: 1 a week  . Drug use: No  . Sexual activity: Yes    Partners: Female    Birth control/protection: None

## 2019-11-25 NOTE — Addendum Note (Signed)
Addended by: Michae Kava B on: 11/25/2019 04:36 PM   Modules accepted: Orders

## 2019-12-02 ENCOUNTER — Other Ambulatory Visit: Payer: Self-pay

## 2019-12-02 ENCOUNTER — Ambulatory Visit (HOSPITAL_COMMUNITY)
Admission: RE | Admit: 2019-12-02 | Discharge: 2019-12-02 | Disposition: A | Discharge status: Home / Self Care | Payer: 59 | Source: Ambulatory Visit | Attending: Physician Assistant | Admitting: Physician Assistant

## 2019-12-02 DIAGNOSIS — S83242A Other tear of medial meniscus, current injury, left knee, initial encounter: Secondary | ICD-10-CM | POA: Diagnosis not present

## 2019-12-02 DIAGNOSIS — M25562 Pain in left knee: Secondary | ICD-10-CM | POA: Diagnosis not present

## 2019-12-07 ENCOUNTER — Encounter: Payer: Self-pay | Admitting: Physician Assistant

## 2019-12-07 ENCOUNTER — Ambulatory Visit: Payer: 59 | Admitting: Physician Assistant

## 2019-12-07 ENCOUNTER — Other Ambulatory Visit: Payer: Self-pay

## 2019-12-07 DIAGNOSIS — S838X2D Sprain of other specified parts of left knee, subsequent encounter: Secondary | ICD-10-CM

## 2019-12-07 DIAGNOSIS — S83242D Other tear of medial meniscus, current injury, left knee, subsequent encounter: Secondary | ICD-10-CM | POA: Diagnosis not present

## 2019-12-07 DIAGNOSIS — S83241D Other tear of medial meniscus, current injury, right knee, subsequent encounter: Secondary | ICD-10-CM

## 2019-12-07 NOTE — Progress Notes (Signed)
Office Visit Note   Patient: Charles [name]           Date of Birth: 1956-06-18           MRN: [id number] Visit Date: 12/07/2019              Requested by: Shawnee Knapp, MD [place],  [place] [address] PCP: Shawnee Knapp, MD   Assessment & Plan: Visit Diagnoses:  1. Acute medial meniscus tear of right knee, subsequent encounter   2. Acute lateral meniscal injury of left knee, subsequent encounter     Plan: Due to the fact that he continues to have mechanical symptoms of the knee and given his MRI findings of medial meniscal tear and a lateral meniscus tear recommend left knee arthroscopy with debridement and partial meniscectomies.  Risk benefits of surgery discussed with him at length.  He understands risk of prolonged pain, worsening pain, DVT/PE and infection.  He will follow up with [place] 1 week postop.  He is given Samella Parr card will call her when he is ready to schedule surgery.  Follow-Up Instructions: Return 1 week postop.   Orders:  No orders of the defined types were placed in this encounter.  No orders of the defined types were placed in this encounter.     Procedures: No procedures performed   Clinical Data: No additional findings.   Subjective: Chief Complaint  Patient presents with  . Left Knee - Follow-up    HPI Charles [name] comes in today to go over the MRI of his left knee.  He continues to have giving way sensation in the knee and some painful popping.  Most of his pain is medial aspect of the left knee. MRI images are reviewed with the patient knee model was also shown to the patient to better demonstrate the pathology.  Left knee MRI dated 12/02/2019 showed horizontal tear medial meniscus from the junction of the posterior horn into the body of the meniscus.  Body is degenerated and diminutive.  Radial tear posterior horn of the lateral meniscus.  Medial compartment slight joint space narrowing and degeneration and thinning cartilage.    Review of Systems Please see HPI otherwise negative  Objective: Vital Signs: There were no vitals taken for this visit.  Physical Exam General: Well-developed well-nourished male no acute distress. Ortho Exam Left knee good range of motion no abnormal warmth erythema or effusion.  Tenderness along the medial joint line.  Ambulates without any assistive device with a slight antalgic gait.  Specialty Comments:  No specialty comments available.  Imaging: No results found.   PMFS History: Patient Active Problem List   Diagnosis Date Noted  . Hearing loss 02/19/2019  . History of shingles 02/19/2019  . Family history of prostate cancer 08/28/2018  . Poor dentition 08/28/2018  . Essential hypertension, benign 08/02/2016  . Pre-diabetes 07/14/2015  . Obesity 07/14/2015  . GERD (gastroesophageal reflux disease) 12/25/2012  . Pure hypercholesterolemia 12/25/2012  . CLL (chronic lymphocytic leukemia) ([place]) 03/18/2012   Past Medical History:  Diagnosis Date  . Allergy   . Arthritis   . CLL (chronic lymphoblastic leukemia) 08/03/2011  . CLL (chronic lymphocytic leukemia) ([place]) 03/18/2012  . GERD (gastroesophageal reflux disease)   . Shingles   . Ulcer     Family History  Problem Relation Age of Onset  . Cancer Brother 38       prostate cancer stage 4  . Hyperlipidemia Father   .  Cancer Brother 41       prostate  . Cancer Sister   . Breast cancer Sister   . Diabetes Mother   . Melanoma Sister   . Cancer Brother   . Cancer - Colon Brother     Past Surgical History:  Procedure Laterality Date  . HERNIA REPAIR    . KNEE SURGERY    . SHOULDER SURGERY    . WRIST SURGERY     Social History   Occupational History  . Occupation: [employer]  Tobacco Use  . Smoking status: Former Smoker    Packs/day: 2.00    Years: 20.00    Pack years: 40.00    Types: Cigarettes    Start date: 08/24/1978    Quit date: 08/24/1998    Years since quitting: 21.3  . Smokeless  tobacco: Never Used  . Tobacco comment: quit smoking 15 years ago  Substance and Sexual Activity  . Alcohol use: Yes    Alcohol/week: 0.0 standard drinks    Comment: 1 a week  . Drug use: No  . Sexual activity: Yes    Partners: Female    Birth control/protection: None

## 2019-12-15 ENCOUNTER — Encounter (HOSPITAL_BASED_OUTPATIENT_CLINIC_OR_DEPARTMENT_OTHER): Payer: Self-pay | Admitting: Orthopaedic Surgery

## 2019-12-15 ENCOUNTER — Other Ambulatory Visit: Payer: Self-pay

## 2019-12-16 ENCOUNTER — Encounter (HOSPITAL_BASED_OUTPATIENT_CLINIC_OR_DEPARTMENT_OTHER)
Admission: RE | Admit: 2019-12-16 | Discharge: 2019-12-16 | Disposition: A | Discharge status: Home / Self Care | Payer: 59 | Source: Ambulatory Visit | Attending: Orthopaedic Surgery | Admitting: Orthopaedic Surgery

## 2019-12-16 DIAGNOSIS — Z01818 Encounter for other preprocedural examination: Secondary | ICD-10-CM | POA: Insufficient documentation

## 2019-12-16 LAB — BASIC METABOLIC PANEL
Anion gap: 10 (ref 5–15)
BUN: 14 mg/dL (ref 8–23)
CO2: 25 mmol/L (ref 22–32)
Calcium: 9 mg/dL (ref 8.9–10.3)
Chloride: 102 mmol/L (ref 98–111)
Creatinine, Ser: 0.97 mg/dL (ref 0.61–1.24)
GFR calc Af Amer: >60 mL/min (ref >60)
GFR calc non Af Amer: >60 mL/min (ref >60)
Glucose, Bld: 100 mg/dL — ABNORMAL HIGH (ref 70–99)
Potassium: 4 mmol/L (ref 3.5–5.1)
Sodium: 137 mmol/L (ref 135–145)

## 2019-12-17 ENCOUNTER — Ambulatory Visit (HOSPITAL_COMMUNITY): Payer: 59

## 2019-12-17 ENCOUNTER — Other Ambulatory Visit: Payer: Self-pay | Admitting: Physician Assistant

## 2019-12-18 ENCOUNTER — Other Ambulatory Visit: Payer: Self-pay

## 2019-12-22 ENCOUNTER — Other Ambulatory Visit (HOSPITAL_COMMUNITY)
Admission: RE | Admit: 2019-12-22 | Discharge: 2019-12-22 | Disposition: A | Discharge status: Home / Self Care | Payer: 59 | Source: Ambulatory Visit | Attending: Orthopaedic Surgery | Admitting: Orthopaedic Surgery

## 2019-12-22 DIAGNOSIS — Z01812 Encounter for preprocedural laboratory examination: Secondary | ICD-10-CM | POA: Insufficient documentation

## 2019-12-22 DIAGNOSIS — Z20822 Contact with and (suspected) exposure to covid-19: Secondary | ICD-10-CM | POA: Insufficient documentation

## 2019-12-22 LAB — SARS CORONAVIRUS 2 (TAT 6-24 HRS): SARS Coronavirus 2: NEGATIVE

## 2019-12-22 MED FILL — MELOXICAM 15 MG TABLET: 15 | 30 days supply | Qty: 90 | Fill #1

## 2019-12-22 MED FILL — FAMCICLOVIR 500 MG TABLET: 500 | 60 days supply | Qty: 60 | Fill #2

## 2019-12-24 ENCOUNTER — Encounter (HOSPITAL_BASED_OUTPATIENT_CLINIC_OR_DEPARTMENT_OTHER)
Admission: RE | Disposition: A | Discharge status: Home / Self Care | Payer: Self-pay | Source: Ambulatory Visit | Attending: Orthopaedic Surgery

## 2019-12-24 ENCOUNTER — Encounter (HOSPITAL_BASED_OUTPATIENT_CLINIC_OR_DEPARTMENT_OTHER): Payer: Self-pay | Admitting: Orthopaedic Surgery

## 2019-12-24 ENCOUNTER — Ambulatory Visit (HOSPITAL_BASED_OUTPATIENT_CLINIC_OR_DEPARTMENT_OTHER): Payer: 59 | Admitting: Certified Registered Nurse Anesthetist

## 2019-12-24 ENCOUNTER — Ambulatory Visit (HOSPITAL_BASED_OUTPATIENT_CLINIC_OR_DEPARTMENT_OTHER)
Admission: RE | Admit: 2019-12-24 | Discharge: 2019-12-24 | Disposition: A | Discharge status: Home / Self Care | Payer: 59 | Source: Ambulatory Visit | Attending: Orthopaedic Surgery | Admitting: Orthopaedic Surgery

## 2019-12-24 ENCOUNTER — Other Ambulatory Visit: Payer: Self-pay

## 2019-12-24 DIAGNOSIS — M1990 Unspecified osteoarthritis, unspecified site: Secondary | ICD-10-CM | POA: Diagnosis not present

## 2019-12-24 DIAGNOSIS — S83272A Complex tear of lateral meniscus, current injury, left knee, initial encounter: Secondary | ICD-10-CM | POA: Diagnosis not present

## 2019-12-24 DIAGNOSIS — S83282A Other tear of lateral meniscus, current injury, left knee, initial encounter: Secondary | ICD-10-CM | POA: Diagnosis not present

## 2019-12-24 DIAGNOSIS — S83242A Other tear of medial meniscus, current injury, left knee, initial encounter: Secondary | ICD-10-CM | POA: Diagnosis not present

## 2019-12-24 DIAGNOSIS — X501XXA Overexertion from prolonged static or awkward postures, initial encounter: Secondary | ICD-10-CM | POA: Diagnosis not present

## 2019-12-24 DIAGNOSIS — S83242D Other tear of medial meniscus, current injury, left knee, subsequent encounter: Secondary | ICD-10-CM | POA: Diagnosis not present

## 2019-12-24 DIAGNOSIS — E7800 Pure hypercholesterolemia, unspecified: Secondary | ICD-10-CM | POA: Diagnosis not present

## 2019-12-24 DIAGNOSIS — M94262 Chondromalacia, left knee: Secondary | ICD-10-CM | POA: Diagnosis not present

## 2019-12-24 DIAGNOSIS — Z87891 Personal history of nicotine dependence: Secondary | ICD-10-CM | POA: Insufficient documentation

## 2019-12-24 DIAGNOSIS — I10 Essential (primary) hypertension: Secondary | ICD-10-CM | POA: Insufficient documentation

## 2019-12-24 HISTORY — DX: Other tear of medial meniscus, current injury, left knee, initial encounter: S83.242A

## 2019-12-24 HISTORY — PX: KNEE ARTHROSCOPY WITH LATERAL MENISECTOMY: SHX6193

## 2019-12-24 SURGERY — ARTHROSCOPY, KNEE, WITH LATERAL MENISCECTOMY
Anesthesia: General | Site: Knee | Laterality: Left

## 2019-12-24 MED ORDER — CEFAZOLIN SODIUM-DEXTROSE 2-4 GM/100ML-% IV SOLN
INTRAVENOUS | Status: AC
  Filled 2019-12-24: qty 100

## 2019-12-24 MED ORDER — KETOROLAC TROMETHAMINE 30 MG/ML IJ SOLN
INTRAMUSCULAR | Status: DC | PRN
Start: 2019-12-24 — End: 2019-12-24
  Administered 2019-12-24: 30 mg via INTRAVENOUS

## 2019-12-24 MED ORDER — ONDANSETRON HCL 4 MG/2ML IJ SOLN
INTRAMUSCULAR | Status: AC
  Filled 2019-12-24: qty 2

## 2019-12-24 MED ORDER — DEXAMETHASONE SODIUM PHOSPHATE 10 MG/ML IJ SOLN
INTRAMUSCULAR | Status: DC | PRN
  Administered 2019-12-24: 10 mg via INTRAVENOUS

## 2019-12-24 MED ORDER — CEFAZOLIN SODIUM-DEXTROSE 2-4 GM/100ML-% IV SOLN
2.0000 g | INTRAVENOUS | Status: AC
  Administered 2019-12-24: 2 g via INTRAVENOUS

## 2019-12-24 MED ORDER — FENTANYL CITRATE (PF) 100 MCG/2ML IJ SOLN: 50.0000 ug | INTRAMUSCULAR | Status: DC | PRN

## 2019-12-24 MED ORDER — OXYCODONE HCL 5 MG/5ML PO SOLN: 5.0000 mg | Freq: Once | ORAL | Status: DC | PRN

## 2019-12-24 MED ORDER — OXYCODONE HCL 5 MG PO TABS: 5.0000 mg | ORAL_TABLET | Freq: Once | ORAL | Status: DC | PRN

## 2019-12-24 MED ORDER — ONDANSETRON HCL 4 MG/2ML IJ SOLN
INTRAMUSCULAR | Status: DC | PRN
  Administered 2019-12-24: 4 mg via INTRAVENOUS

## 2019-12-24 MED ORDER — BUPIVACAINE HCL (PF) 0.5 % IJ SOLN
INTRAMUSCULAR | Status: AC
  Filled 2019-12-24: qty 30

## 2019-12-24 MED ORDER — ONDANSETRON HCL 4 MG/2ML IJ SOLN: 4.0000 mg | Freq: Once | INTRAMUSCULAR | Status: DC | PRN

## 2019-12-24 MED ORDER — BUPIVACAINE HCL (PF) 0.25 % IJ SOLN
INTRAMUSCULAR | Status: AC
  Filled 2019-12-24: qty 30

## 2019-12-24 MED ORDER — MIDAZOLAM HCL 2 MG/2ML IJ SOLN
INTRAMUSCULAR | Status: AC
  Filled 2019-12-24: qty 2

## 2019-12-24 MED ORDER — SODIUM CHLORIDE 0.9 % IR SOLN
Status: DC | PRN
  Administered 2019-12-24: 6000 mL

## 2019-12-24 MED ORDER — LIDOCAINE 2% (20 MG/ML) 5 ML SYRINGE
INTRAMUSCULAR | Status: AC
  Filled 2019-12-24: qty 5

## 2019-12-24 MED ORDER — LACTATED RINGERS IV SOLN: INTRAVENOUS | Status: DC

## 2019-12-24 MED ORDER — PROPOFOL 10 MG/ML IV BOLUS
INTRAVENOUS | Status: DC | PRN
  Administered 2019-12-24: 200 mg via INTRAVENOUS

## 2019-12-24 MED ORDER — LIDOCAINE HCL (PF) 1 % IJ SOLN
INTRAMUSCULAR | Status: DC | PRN
  Administered 2019-12-24: 20 mL

## 2019-12-24 MED ORDER — CHLORHEXIDINE GLUCONATE 0.12 % MT SOLN: 15.0000 mL | Freq: Once | OROMUCOSAL | Status: DC

## 2019-12-24 MED ORDER — MIDAZOLAM HCL 2 MG/2ML IJ SOLN: 1.0000 mg | INTRAMUSCULAR | Status: DC | PRN

## 2019-12-24 MED ORDER — LIDOCAINE HCL (PF) 1 % IJ SOLN
INTRAMUSCULAR | Status: AC
  Filled 2019-12-24: qty 30

## 2019-12-24 MED ORDER — ONDANSETRON HCL 4 MG/2ML IJ SOLN
INTRAMUSCULAR | Status: AC
  Filled 2019-12-24: qty 6

## 2019-12-24 MED ORDER — LIDOCAINE 2% (20 MG/ML) 5 ML SYRINGE
INTRAMUSCULAR | Status: DC | PRN
  Administered 2019-12-24: 80 mg via INTRAVENOUS

## 2019-12-24 MED ORDER — ORAL CARE MOUTH RINSE: 15.0000 mL | Freq: Once | OROMUCOSAL | Status: DC

## 2019-12-24 MED ORDER — FENTANYL CITRATE (PF) 100 MCG/2ML IJ SOLN
INTRAMUSCULAR | Status: AC
  Filled 2019-12-24: qty 2

## 2019-12-24 MED ORDER — PROPOFOL 500 MG/50ML IV EMUL
INTRAVENOUS | Status: AC
  Filled 2019-12-24: qty 50

## 2019-12-24 MED ORDER — HYDROCODONE-ACETAMINOPHEN 5-325 MG PO TABS: 1.0000 | ORAL_TABLET | Freq: Four times a day (QID) | ORAL | 0 refills | Status: DC | PRN

## 2019-12-24 MED ORDER — FENTANYL CITRATE (PF) 100 MCG/2ML IJ SOLN: 25.0000 ug | INTRAMUSCULAR | Status: DC | PRN

## 2019-12-24 MED ORDER — MORPHINE SULFATE (PF) 4 MG/ML IV SOLN
INTRAVENOUS | Status: DC | PRN
  Administered 2019-12-24: 4 mg

## 2019-12-24 MED ORDER — MORPHINE SULFATE (PF) 4 MG/ML IV SOLN
INTRAVENOUS | Status: AC
  Filled 2019-12-24: qty 1

## 2019-12-24 MED ORDER — KETOROLAC TROMETHAMINE 30 MG/ML IJ SOLN
INTRAMUSCULAR | Status: AC
  Filled 2019-12-24: qty 1

## 2019-12-24 MED ORDER — FENTANYL CITRATE (PF) 100 MCG/2ML IJ SOLN
INTRAMUSCULAR | Status: DC | PRN
  Administered 2019-12-24: 25 ug via INTRAVENOUS
  Administered 2019-12-24: 50 ug via INTRAVENOUS
  Administered 2019-12-24: 25 ug via INTRAVENOUS

## 2019-12-24 MED ORDER — DEXAMETHASONE SODIUM PHOSPHATE 10 MG/ML IJ SOLN
INTRAMUSCULAR | Status: AC
  Filled 2019-12-24: qty 2

## 2019-12-24 MED ORDER — MIDAZOLAM HCL 5 MG/5ML IJ SOLN
INTRAMUSCULAR | Status: DC | PRN
  Administered 2019-12-24: 2 mg via INTRAVENOUS

## 2019-12-24 MED ORDER — DEXAMETHASONE SODIUM PHOSPHATE 10 MG/ML IJ SOLN
INTRAMUSCULAR | Status: AC
  Filled 2019-12-24: qty 1

## 2019-12-24 MED FILL — HYDROCODON-APAP 5-325: 5-325 | 4 days supply | Qty: 30 | Fill #0

## 2019-12-24 SURGICAL SUPPLY — 36 items
BLADE EXCALIBUR 4.0X13 (MISCELLANEOUS) IMPLANT
BNDG ELASTIC 6X5.8 VLCR STR LF (GAUZE/BANDAGES/DRESSINGS) ×2 IMPLANT
COVER WAND RF STERILE (DRAPES) IMPLANT
DISSECTOR  3.8MM X 13CM (MISCELLANEOUS) ×2
DISSECTOR 3.8MM X 13CM (MISCELLANEOUS) IMPLANT
DRAPE ARTHROSCOPY W/POUCH 90 (DRAPES) ×2 IMPLANT
DRAPE U-SHAPE 47X51 STRL (DRAPES) ×2 IMPLANT
DRSG PAD ABDOMINAL 8X10 ST (GAUZE/BANDAGES/DRESSINGS) ×2 IMPLANT
DURAPREP 26ML APPLICATOR (WOUND CARE) ×2 IMPLANT
ELECT MENISCUS 165MM 90D (ELECTRODE) IMPLANT
ELECT REM PT RETURN 9FT ADLT (ELECTROSURGICAL)
ELECTRODE REM PT RTRN 9FT ADLT (ELECTROSURGICAL) IMPLANT
EXCALIBUR 3.8MM X 13CM (MISCELLANEOUS) IMPLANT
GAUZE SPONGE 4X4 12PLY STRL (GAUZE/BANDAGES/DRESSINGS) ×2 IMPLANT
GAUZE XEROFORM 1X8 LF (GAUZE/BANDAGES/DRESSINGS) ×2 IMPLANT
GLOVE BIO SURGEON STRL SZ 6.5 (GLOVE) ×1 IMPLANT
GLOVE BIOGEL PI IND STRL 7.0 (GLOVE) IMPLANT
GLOVE BIOGEL PI IND STRL 8 (GLOVE) ×2 IMPLANT
GLOVE BIOGEL PI INDICATOR 7.0 (GLOVE) ×1
GLOVE BIOGEL PI INDICATOR 8 (GLOVE) ×2
GLOVE ORTHO TXT STRL SZ7.5 (GLOVE) ×2 IMPLANT
GLOVE SURG ORTHO 8.0 STRL STRW (GLOVE) ×2 IMPLANT
GOWN STRL REUS W/ TWL LRG LVL3 (GOWN DISPOSABLE) ×2 IMPLANT
GOWN STRL REUS W/ TWL XL LVL3 (GOWN DISPOSABLE) ×1 IMPLANT
GOWN STRL REUS W/TWL LRG LVL3 (GOWN DISPOSABLE) ×4
GOWN STRL REUS W/TWL XL LVL3 (GOWN DISPOSABLE) ×2
KNEE WRAP E Z 3 GEL PACK (MISCELLANEOUS) ×2 IMPLANT
MANIFOLD NEPTUNE II (INSTRUMENTS) ×1 IMPLANT
PACK DSU ARTHROSCOPY (CUSTOM PROCEDURE TRAY) ×2 IMPLANT
PADDING CAST COTTON 6X4 STRL (CAST SUPPLIES) ×2 IMPLANT
PENCIL SMOKE EVACUATOR (MISCELLANEOUS) IMPLANT
SET BASIN DAY SURGERY F.S. (CUSTOM PROCEDURE TRAY) ×2 IMPLANT
SUT ETHILON 3 0 PS 1 (SUTURE) ×2 IMPLANT
TOWEL GREEN STERILE FF (TOWEL DISPOSABLE) ×2 IMPLANT
TUBING ARTHROSCOPY IRRIG 16FT (MISCELLANEOUS) ×2 IMPLANT
WATER STERILE IRR 1000ML POUR (IV SOLUTION) ×2 IMPLANT

## 2019-12-24 NOTE — Transfer of Care (Signed)
Immediate Anesthesia Transfer of Care Note  Patient: Charles [name]  Procedure(s) Performed: KNEE ARTHROSCOPY WITH PARTIAL LATERAL MENISECTOMY (Left Knee)  Patient Location: PACU  Anesthesia Type:General  Level of Consciousness: drowsy and patient cooperative  Airway & Oxygen Therapy: Patient Spontanous Breathing and Patient connected to face mask oxygen  Post-op Assessment: Report given to RN and Post -op Vital signs reviewed and stable  Post vital signs: Reviewed and stable  Last Vitals:  Vitals Value Taken Time  BP 90/64 12/24/19 1101  Temp    Pulse 59 12/24/19 1104  Resp 15 12/24/19 1104  SpO2 97 % 12/24/19 1104  Vitals shown include unvalidated device data.  Last Pain:  Vitals:   12/24/19 0947  TempSrc: Oral  PainSc: 0-No pain         Complications: No apparent anesthesia complications

## 2019-12-24 NOTE — H&P (Signed)
Charles [name] is an 64 y.o. male.   Chief Complaint:  Left knee pain with locking and catching HPI: Clair Gulling sustained an acute twisting injury to his left knee recently.  He felt a pop in the knee and since then had swelling with mechanical symptoms.  Conservative treatment was tried with his knee with activity modification and rest.  He also had a steroid injection as well.  When conservative treatment failed we sent him for an MRI of his left knee.  The MRI does show a complex tear of the medial meniscus of his left knee.  There is also some tearing of the lateral meniscus.  At this point with his continued mechanical symptoms and the failure of conservative treatment we have recommended an arthroscopic intervention for his left knee.  Past Medical History:  Diagnosis Date  . Allergy   . Arthritis   . CLL (chronic lymphoblastic leukemia) 08/03/2011  . CLL (chronic lymphocytic leukemia) ([place]) 03/18/2012  . GERD (gastroesophageal reflux disease)   . Shingles   . Ulcer     Past Surgical History:  Procedure Laterality Date  . HERNIA REPAIR    . KNEE SURGERY    . SHOULDER SURGERY    . WRIST SURGERY      Family History  Problem Relation Age of Onset  . Cancer Brother 27       prostate cancer stage 4  . Hyperlipidemia Father   . Cancer Brother 43       prostate  . Cancer Sister   . Breast cancer Sister   . Diabetes Mother   . Melanoma Sister   . Cancer Brother   . Cancer - Colon Brother    Social History:  reports that he quit smoking about 21 years ago. His smoking use included cigarettes. He started smoking about 41 years ago. He has a 40.00 pack-year smoking history. He has never used smokeless tobacco. He reports current alcohol use. He reports that he does not use drugs.  Allergies: No Known Allergies  No medications prior to admission.    Results for orders placed or performed during the hospital encounter of 12/22/19 (from the past 48 hour(s))  SARS CORONAVIRUS 2 (TAT 6-24  HRS) Nasopharyngeal Nasopharyngeal Swab     Status: None   Collection Time: 12/22/19 11:22 AM   Specimen: Nasopharyngeal Swab  Result Value Ref Range   SARS Coronavirus 2 NEGATIVE NEGATIVE    Comment: (NOTE) SARS-CoV-2 target nucleic acids are NOT DETECTED. The SARS-CoV-2 RNA is generally detectable in upper and lower respiratory specimens during the acute phase of infection. Negative results do not preclude SARS-CoV-2 infection, do not rule out co-infections with other pathogens, and should not be used as the sole basis for treatment or other patient management decisions. Negative results must be combined with clinical observations, patient history, and epidemiological information. The expected result is Negative. Fact Sheet for Patients: SugarRoll.be Fact Sheet for Healthcare Providers: https://www.woods-mathews.com/ This test is not yet approved or cleared by the [place] FDA and  has been authorized for detection and/or diagnosis of SARS-CoV-2 by FDA under an Emergency Use Authorization (EUA). This EUA will remain  in effect (meaning this test can be used) for the duration of the COVID-19 declaration under Section 56 4(b)(1) of the Act, 21 U.S.C. section 360bbb-3(b)(1), unless the authorization is terminated or revoked sooner. Performed at [facility], [place] [address] [place], [place] [address]    No results found.  Review of Systems  Musculoskeletal: Positive for joint swelling.  All other systems reviewed and are negative.   Height 5\' 9"  (1.753 m), weight 113.4 kg. Physical Exam  Constitutional: He is oriented to person, place, and time. He appears well-developed and well-nourished.  HENT:  Head: Normocephalic and atraumatic.  Eyes: Pupils are equal, round, and reactive to light. EOM are normal.  Cardiovascular: Normal rate and regular rhythm.  Respiratory: Effort normal and breath sounds normal.  GI: Soft. Bowel  sounds are normal.  Musculoskeletal:     Cervical back: Normal range of motion and neck supple.     Left knee: Effusion present. Decreased range of motion. Tenderness present over the medial joint line and lateral joint line. Abnormal meniscus.  Neurological: He is alert and oriented to person, place, and time.  Skin: Skin is warm and dry.  Psychiatric: He has a normal mood and affect.     Assessment/Plan Left knee acute medial and lateral meniscal tears  The plan is to proceed to surgery today for left knee arthroscopy will hopefully be both therapeutic and diagnostic.  Based on the MRI results, hopefully performing partial medial and lateral meniscectomies will help decrease his mechanical symptoms and swelling and improve the function of the knee.  We had a long and thorough discussion about the surgery and the risk and benefits were explained in detail.  Informed consent is obtained.  Mcarthur Rossetti, MD 12/24/2019, 9:09 AM

## 2019-12-24 NOTE — Op Note (Signed)
NAME: Charles [name], Charles [name] MEDICAL RECORD [id number] ACCOUNT [id number] DATE OF BIRTH:Dec 11, 1955 FACILITY: [facility] LOCATION: [facility] PHYSICIAN:Collen Hostler Kerry Fort, MD  OPERATIVE REPORT  DATE OF PROCEDURE:  12/24/2019  PREOPERATIVE DIAGNOSIS:  Left knee medial and lateral acute meniscal tears.  POSTOPERATIVE DIAGNOSIS:  Left knee lateral complex posterior horn lateral meniscal tear.  PROCEDURE:  Left knee arthroscopy with debridement and partial lateral meniscectomy.  FINDINGS:   1.  Posterior horn and root lateral meniscal tear. 2.  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  SURGEON:  Lind Guest. Ninfa Linden, MD  ASSISTANT:  Erskine Emery, PA-C  ANESTHESIA:   1.  General. 2.  Local left knee block.  ANTIBIOTICS:  Two grams IV Ancef.  ESTIMATED BLOOD LOSS:  Minimal.  COMPLICATIONS:  None.  INDICATIONS:  The patient is a 64 year old gentleman with no previous knee issues or knee pain who sustained a work-related twisting injury to his left knee.  He then developed immediate pain and swelling in the knee.  We saw him in the office and tried  conservative therapy with activity modification and time and rest.  We placed steroid injection in the knee as well.  After the continued mechanical symptoms of locking and catching an MRI was obtained of his left knee.  It did show a complex tear of the  posterior horn of the lateral meniscus, but also a medial meniscal tear as well.  Given his continued mechanical symptoms, and the failure of conservative treatment, we did recommend an arthroscopic intervention.  He is presenting for that today.  We  had a long and thorough discussion about the surgery as well as the risks and benefits involved.  DESCRIPTION OF PROCEDURE:  After informed consent was obtained and appropriate left knee was marked, he was brought to the operating room and placed supine on the operating table.  General anesthesia was then obtained.  His  left thigh, knee, leg, and  ankle were prepped and draped with DuraPrep and sterile drapes including a sterile stockinette.  With a lateral leg post utilized the bed was raised and the left operative knee was flexed off the side of the table.  A timeout was called to identify  correct patient, correct left knee.  We then made an anterolateral arthroscopy portal into the left knee and placed a cannula in the knee.  There was not any type of perfusion to drain to we placed a camera in the knee went to the medial compartment.  At  that point, we made an anteromedial incision.  We did find some areas of grade III chondromalacia of the medial femoral condyle and I probed the medial meniscus and did not find any significant medial meniscal tear.  There was a little bit of fraying of  the meniscus.  We debrided this back to a stable margin using arthroscopic shaver.  We also performed a chondroplasty on the medial femoral condyle, which was abrasion/debridement of the articular cartilage.  We then assessed the intercondylar area of  the knee and the ACL and PCL were intact.  With the knee in a figure-of-four position we went to the lateral compartment, we did find a complex flap tear of the posterior horn and root of the lateral meniscus.  This does appear to be an acute type of  injury and one we do not usually see.  Using arthroscopic basket forceps biters and a shaver, we were able to perform a partial lateral meniscectomy getting this still back to a  stable margin with no flap component.  We found some thinning of the  cartilage on the lateral femoral condyle as well, but neither of the medial and lateral femoral condyles had exposed bone.  We then assessed the patellofemoral joint and found grade II chondromalacia of the trochlea groove as well as the undersurface of  the patella.  This was debrided back to stable margins as well.  We then allowed fluid lavage of the knee and drained all the fluid from the  knee.  We closed the portal sites with interrupted nylon suture.  Xeroform well-padded sterile dressing was  applied.  The patient was awakened, extubated, and taken to recovery room in stable condition with all final counts being correct and no complications noted.  Postoperatively, we will allow him weightbearing as tolerated.  We will give discharge  instructions on wound care and how to treat the knee.  We will see him back in the office in a week.  CN/NUANCE  D:12/24/2019 T:12/24/2019 JOB:[id number]

## 2019-12-24 NOTE — Anesthesia Preprocedure Evaluation (Signed)
Anesthesia Evaluation  Patient identified by MRN, date of birth, ID band Patient awake    Reviewed: Allergy & Precautions, NPO status , Patient's Chart, lab work & pertinent test results  History of Anesthesia Complications Negative for: history of anesthetic complications  Airway Mallampati: II  TM Distance: >3 FB Neck ROM: Full    Dental   Pulmonary neg pulmonary ROS, former smoker,    Pulmonary exam normal        Cardiovascular hypertension, Normal cardiovascular exam     Neuro/Psych negative neurological ROS  negative psych ROS   GI/Hepatic Neg liver ROS, GERD  ,  Endo/Other  negative endocrine ROS  Renal/GU negative Renal ROS  negative genitourinary   Musculoskeletal negative musculoskeletal ROS (+)   Abdominal   Peds  Hematology CLL   Anesthesia Other Findings   Reproductive/Obstetrics                           Anesthesia Physical Anesthesia Plan  ASA: II  Anesthesia Plan: General   Post-op Pain Management:    Induction: Intravenous  PONV Risk Score and Plan: 2 and Ondansetron, Dexamethasone, Midazolam and Treatment may vary due to age or medical condition  Airway Management Planned: LMA  Additional Equipment: None  Intra-op Plan:   Post-operative Plan: Extubation in OR  Informed Consent: I have reviewed the patients History and Physical, chart, labs and discussed the procedure including the risks, benefits and alternatives for the proposed anesthesia with the patient or authorized representative who has indicated his/her understanding and acceptance.     Dental advisory given  Plan Discussed with:   Anesthesia Plan Comments:         Anesthesia Quick Evaluation

## 2019-12-24 NOTE — Anesthesia Procedure Notes (Signed)
Procedure Name: LMA Insertion Date/Time: 12/24/2019 10:17 AM Performed by: Genelle Bal, CRNA Pre-anesthesia Checklist: Patient identified, Emergency Drugs available, Suction available and Patient being monitored Patient Re-evaluated:Patient Re-evaluated prior to induction Oxygen Delivery Method: Circle system utilized Preoxygenation: Pre-oxygenation with 100% oxygen Induction Type: IV induction Ventilation: Mask ventilation without difficulty LMA: LMA inserted LMA Size: 5.0 Number of attempts: 1 Airway Equipment and Method: Bite block Placement Confirmation: positive ETCO2 Tube secured with: Tape Dental Injury: Teeth and Oropharynx as per pre-operative assessment

## 2019-12-24 NOTE — Brief Op Note (Signed)
12/24/2019  10:56 AM  PATIENT:  Charles [name]  64 y.o. male  PRE-OPERATIVE DIAGNOSIS:  Left knee medial and lateral meniscal tears  POST-OPERATIVE DIAGNOSIS:  Left knee medial and lateral meniscal tears  PROCEDURE:  Procedure(s): KNEE ARTHROSCOPY WITH PARTIAL LATERAL MENISECTOMY (Left)  SURGEON:  Surgeon(s) and Role:    Mcarthur Rossetti, MD - Primary  PHYSICIAN ASSISTANT: Benita Stabile, PA-C  ANESTHESIA:   local and general  EBL:  5 mL   COUNTS:  YES  DICTATION: .Other Dictation: Dictation Number [id number]  PLAN OF CARE: Discharge to home after PACU  PATIENT DISPOSITION:  PACU - hemodynamically stable.   Delay start of Pharmacological VTE agent (>24hrs) due to surgical blood loss or risk of bleeding: no

## 2019-12-24 NOTE — Anesthesia Postprocedure Evaluation (Signed)
Anesthesia Post Note  Patient: Charles [name]  Procedure(s) Performed: KNEE ARTHROSCOPY WITH PARTIAL LATERAL MENISECTOMY (Left Knee)     Patient location during evaluation: PACU Anesthesia Type: General Level of consciousness: awake and alert Pain management: pain level controlled Vital Signs Assessment: post-procedure vital signs reviewed and stable Respiratory status: spontaneous breathing, nonlabored ventilation and respiratory function stable Cardiovascular status: blood pressure returned to baseline and stable Postop Assessment: no apparent nausea or vomiting Anesthetic complications: no    Last Vitals:  Vitals:   12/24/19 1140 12/24/19 1204  BP:  117/75  Pulse: 63 61  Resp: 13 (!) 22  Temp:  36.5 C  SpO2: 95% 95%    Last Pain:  Vitals:   12/24/19 1204  TempSrc: Oral  PainSc: 0-No pain                 Lidia Collum

## 2019-12-24 NOTE — Discharge Instructions (Signed)
Expect left knee swelling - ice and elevation as needed. Do pump your feet several times throughout the day for the next several days. You may put your full weight on your left knee and leg as comfort allows. You may remove your dressings tomorrow 6/4 and get your incisions wet daily in the shower. Place small band-aids over your incisions daily after each shower.   Post Anesthesia Home Care Instructions  Activity: Get plenty of rest for the remainder of the day. A responsible individual must stay with you for 24 hours following the procedure.  For the next 24 hours, DO NOT: -Drive a car -Paediatric nurse -Drink alcoholic beverages -Take any medication unless instructed by your physician -Make any legal decisions or sign important papers.  Meals: Start with liquid foods such as gelatin or soup. Progress to regular foods as tolerated. Avoid greasy, spicy, heavy foods. If nausea and/or vomiting occur, drink only clear liquids until the nausea and/or vomiting subsides. Call your physician if vomiting continues.  Special Instructions/Symptoms: Your throat may feel dry or sore from the anesthesia or the breathing tube placed in your throat during surgery. If this causes discomfort, gargle with warm salt water. The discomfort should disappear within 24 hours.  *May take Ibuprofen at 7pm today 12/24/19   Call your surgeon if you experience:   1.  Fever over 101.0. 2.  Inability to urinate. 3.  Nausea and/or vomiting. 4.  Extreme swelling or bruising at the surgical site. 5.  Continued bleeding from the incision. 6.  Increased pain, redness or drainage from the incision. 7.  Problems related to your pain medication. 8.  Any problems and/or concerns

## 2019-12-25 ENCOUNTER — Encounter: Payer: Self-pay | Admitting: *Deleted

## 2019-12-31 ENCOUNTER — Encounter: Payer: Self-pay | Admitting: Physician Assistant

## 2019-12-31 ENCOUNTER — Ambulatory Visit (INDEPENDENT_AMBULATORY_CARE_PROVIDER_SITE_OTHER): Payer: 59 | Admitting: Physician Assistant

## 2019-12-31 ENCOUNTER — Other Ambulatory Visit: Payer: Self-pay

## 2019-12-31 DIAGNOSIS — Z9889 Other specified postprocedural states: Secondary | ICD-10-CM

## 2019-12-31 NOTE — Progress Notes (Addendum)
HPI [name] returns today 1 week status post left knee arthroscopy.  He states is doing okay some pain in the knee is a catching sensation at times.  He has had no fevers chills no shortness of breath no chest pain or calf pain.  Pain posterior aspect of the knee mainly the lateral side. He was found to have a lateral meniscus tear involving the posterior horn root and also grade 2 grade III chondromalacia of the medial femoral condyle.  Grade II chondromalacia trochlea groove.  Physical exam: Left knee full extension flexion to approximately 110 degrees.  Port sites healing well no signs of infection.  Left calf supple nontender.   Impression: 1 week status post left knee arthroscopy with partial lateral meniscectomy.  Plan: We will continue to work on strengthening the knee.  Sutures removed.  No submerging the port sites until wounds are completely healed.  Questions were encouraged and answered.  We will see him back in 4 weeks sooner if there is any questions or concerns.

## 2020-01-11 MED FILL — ESOMEPRAZOLE MAG DR 40 MG C: 40 | 90 days supply | Qty: 90 | Fill #1

## 2020-01-22 DIAGNOSIS — D225 Melanocytic nevi of trunk: Secondary | ICD-10-CM | POA: Diagnosis not present

## 2020-01-22 DIAGNOSIS — D2272 Melanocytic nevi of left lower limb, including hip: Secondary | ICD-10-CM | POA: Diagnosis not present

## 2020-01-22 DIAGNOSIS — L918 Other hypertrophic disorders of the skin: Secondary | ICD-10-CM | POA: Diagnosis not present

## 2020-01-22 DIAGNOSIS — Z85828 Personal history of other malignant neoplasm of skin: Secondary | ICD-10-CM | POA: Diagnosis not present

## 2020-01-22 DIAGNOSIS — L821 Other seborrheic keratosis: Secondary | ICD-10-CM | POA: Diagnosis not present

## 2020-01-22 DIAGNOSIS — D224 Melanocytic nevi of scalp and neck: Secondary | ICD-10-CM | POA: Diagnosis not present

## 2020-01-22 DIAGNOSIS — Z85820 Personal history of malignant melanoma of skin: Secondary | ICD-10-CM | POA: Diagnosis not present

## 2020-01-22 DIAGNOSIS — D1801 Hemangioma of skin and subcutaneous tissue: Secondary | ICD-10-CM | POA: Diagnosis not present

## 2020-01-22 DIAGNOSIS — D485 Neoplasm of uncertain behavior of skin: Secondary | ICD-10-CM | POA: Diagnosis not present

## 2020-01-28 ENCOUNTER — Encounter: Payer: Self-pay | Admitting: Physician Assistant

## 2020-01-28 ENCOUNTER — Other Ambulatory Visit: Payer: Self-pay

## 2020-01-28 ENCOUNTER — Ambulatory Visit (INDEPENDENT_AMBULATORY_CARE_PROVIDER_SITE_OTHER): Payer: 59 | Admitting: Physician Assistant

## 2020-01-28 DIAGNOSIS — Z9889 Other specified postprocedural states: Secondary | ICD-10-CM

## 2020-01-28 NOTE — Progress Notes (Signed)
HPI: Charles [name] returns today status post left knee arthroscopy 12/24/2019.  He underwent partial lateral meniscectomy and debridement of the knee is found to have grade 2 to grade III chondromalacia of the medial femoral condyle and grade 2 changes of the trochlear groove.  He states overall knee is better and is range of motion strength are improving he has pain in the popliteal region otherwise doing well.  Physical exam: Left knee full extension full flexion.  Port sites are healing well no calf tenderness.  Impression: Status post left knee arthroscopy with partial lateral meniscectomy  Plan: He will continue work on scar tissue mobilization.  Continue to work on Hotel manager.  Follow-up with [place] on an as-needed basis.  He may benefit from cortisone or supplemental injection in future.

## 2020-02-11 ENCOUNTER — Inpatient Hospital Stay: Payer: 59 | Attending: Hematology & Oncology | Admitting: Hematology & Oncology

## 2020-02-11 ENCOUNTER — Inpatient Hospital Stay: Payer: 59

## 2020-02-11 ENCOUNTER — Other Ambulatory Visit: Payer: Self-pay

## 2020-02-11 ENCOUNTER — Encounter: Payer: Self-pay | Admitting: Hematology & Oncology

## 2020-02-11 VITALS — BP 135/77 | HR 59 | Temp 98.3°F | Resp 16 | Wt 262.1 lb

## 2020-02-11 DIAGNOSIS — C9110 Chronic lymphocytic leukemia of B-cell type not having achieved remission: Secondary | ICD-10-CM

## 2020-02-11 DIAGNOSIS — Z9221 Personal history of antineoplastic chemotherapy: Secondary | ICD-10-CM | POA: Diagnosis not present

## 2020-02-11 LAB — CMP (CANCER CENTER ONLY)
ALT: 17 U/L (ref 0–44)
AST: 19 U/L (ref 15–41)
Albumin: 4.8 g/dL (ref 3.5–5.0)
Alkaline Phosphatase: 65 U/L (ref 38–126)
Anion gap: 8 (ref 5–15)
BUN: 20 mg/dL (ref 8–23)
CO2: 32 mmol/L (ref 22–32)
Calcium: 9.9 mg/dL (ref 8.9–10.3)
Chloride: 97 mmol/L — ABNORMAL LOW (ref 98–111)
Creatinine: 1.25 mg/dL — ABNORMAL HIGH (ref 0.61–1.24)
GFR, Est AFR Am: >60 mL/min (ref >60)
GFR, Estimated: >60 mL/min (ref >60)
Glucose, Bld: 119 mg/dL — ABNORMAL HIGH (ref 70–99)
Potassium: 4.2 mmol/L (ref 3.5–5.1)
Sodium: 137 mmol/L (ref 135–145)
Total Bilirubin: 0.7 mg/dL (ref 0.3–1.2)
Total Protein: 7.2 g/dL (ref 6.5–8.1)

## 2020-02-11 LAB — CBC WITH DIFFERENTIAL (CANCER CENTER ONLY)
Abs Immature Granulocytes: 0.02 10*3/uL (ref 0.00–0.07)
Basophils Absolute: 0.1 10*3/uL (ref 0.0–0.1)
Basophils Relative: 1 %
Eosinophils Absolute: 0.2 10*3/uL (ref 0.0–0.5)
Eosinophils Relative: 2 %
HCT: 44.3 % (ref 39.0–52.0)
Hemoglobin: 15.4 g/dL (ref 13.0–17.0)
Immature Granulocytes: 0 %
Lymphocytes Relative: 39 %
Lymphs Abs: 3.5 10*3/uL (ref 0.7–4.0)
MCH: 32.6 pg (ref 26.0–34.0)
MCHC: 34.8 g/dL (ref 30.0–36.0)
MCV: 93.7 fL (ref 80.0–100.0)
Monocytes Absolute: 0.7 10*3/uL (ref 0.1–1.0)
Monocytes Relative: 8 %
Neutro Abs: 4.5 10*3/uL (ref 1.7–7.7)
Neutrophils Relative %: 50 %
Platelet Count: 206 10*3/uL (ref 150–400)
RBC: 4.73 MIL/uL (ref 4.22–5.81)
RDW: 12.1 % (ref 11.5–15.5)
WBC Count: 9 10*3/uL (ref 4.0–10.5)
nRBC: 0 % (ref 0.0–0.2)

## 2020-02-11 LAB — SAVE SMEAR(SSMR), FOR PROVIDER SLIDE REVIEW

## 2020-02-11 NOTE — Progress Notes (Signed)
Hematology and Oncology Follow Up Visit  Charles [name] [id number] Sep 28, 1955 64 y.o. 02/11/2020   Principle Diagnosis:  Chronic lymphocytic leukemia (Trisomy 12) - stage C - remission  Current Therapy:   Observation   Interim History:  Charles [name] is here today for follow-up.,  We see him yearly.  Actually, I see him every now and then in the hospital.  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  He is doing well.  His weight is going up a little bit.  His wife is doing okay.  She had a horrible case of meningitis.  Thankfully she recovered from this.  He has had no problem with infections.  He has had the coronavirus vaccine.  He has had no cough or shortness of breath.  There has been no nausea or vomiting.  He has had no change in bowel or bladder habits.  He has had no bleeding.  Charles [name] has had a couple basal cell removed from his skin.  Overall, his performance status is ECOG 0.    Medications:  Allergies as of 02/11/2020   No Known Allergies     Medication List       Accurate as of February 11, 2020  1:01 PM. If you have any questions, ask your nurse or doctor.        STOP taking these medications   fluorouracil 5 % cream Commonly known as: EFUDEX Stopped by: Volanda Napoleon, MD     TAKE these medications   aspirin 81 MG tablet Take 81 mg by mouth daily.   cetirizine 10 MG tablet Commonly known as: ZYRTEC Take 10 mg by mouth daily.   esomeprazole 40 MG capsule Commonly known as: NEXIUM Take 1 capsule (40 mg total) by mouth daily. Before bed   famciclovir 500 MG tablet Commonly known as: FAMVIR TAKE 1 TABLET (500 MG TOTAL) BY MOUTH EVERY MORNING.   HYDROcodone-acetaminophen 5-325 MG tablet Commonly known as: NORCO/VICODIN Take 1-2 tablets by mouth every 6 (six) hours as needed for moderate pain.   ketoconazole 2 % cream Commonly known as: NIZORAL SMARTSIG:1 Application Topical 1 to 2 Times  Daily   lisinopril-hydrochlorothiazide 20-25 MG tablet Commonly known as: ZESTORETIC Take 1 tablet by mouth daily.   meloxicam 15 MG tablet Commonly known as: MOBIC TAKE 1 TABLET BY MOUTH DAILY.   rosuvastatin 10 MG tablet Commonly known as: CRESTOR Take 1 tablet (10 mg total) by mouth every morning.   sildenafil 100 MG tablet Commonly known as: Viagra Take 0.5 tablets (50 mg total) by mouth daily as needed for erectile dysfunction.   Vitamin D3 50 MCG (2000 UT) Tabs Take 2,000 Units by mouth.       Allergies: No Known Allergies  Past Medical History, Surgical history, Social history, and Family History were reviewed and updated.  Review of Systems: Review of Systems  Constitutional: Negative.   HENT: Negative.   Eyes: Negative.   Cardiovascular: Negative.   Gastrointestinal: Negative.   Genitourinary: Negative.   Musculoskeletal: Negative.   Skin: Negative.   Neurological: Negative.   Endo/Heme/Allergies: Negative.   Psychiatric/Behavioral: Negative.      Physical Exam:  weight is 262 lb 1.3 oz (118.9 kg) (abnormal). His oral temperature is 98.3 F (36.8 C). His blood pressure is 135/77 (abnormal) and his pulse is 59. His respiration is 16 and oxygen saturation is 97%.   Wt Readings from Last  3 Encounters:  02/11/20 (!) 262 lb 1.3 oz (118.9 kg)  12/24/19 259 lb 4.2 oz (117.6 kg)  02/11/19 260 lb (117.9 kg)    Physical Exam Vitals reviewed.  HENT:     Head: Normocephalic and atraumatic.  Eyes:     Pupils: Pupils are equal, round, and reactive to light.  Cardiovascular:     Rate and Rhythm: Normal rate and regular rhythm.     Heart sounds: Normal heart sounds.  Pulmonary:     Effort: Pulmonary effort is normal.     Breath sounds: Normal breath sounds.  Abdominal:     General: Bowel sounds are normal.     Palpations: Abdomen is soft.  Musculoskeletal:        General: No tenderness or deformity. Normal range of motion.     Cervical back: Normal  range of motion.  Lymphadenopathy:     Cervical: No cervical adenopathy.  Skin:    General: Skin is warm and dry.     Findings: No erythema or rash.  Neurological:     Mental Status: He is alert and oriented to person, place, and time.  Psychiatric:        Behavior: Behavior normal.        Thought Content: Thought content normal.        Judgment: Judgment normal.       Lab Results  Component Value Date   WBC 9.0 02/11/2020   HGB 15.4 02/11/2020   HCT 44.3 02/11/2020   MCV 93.7 02/11/2020   PLT 206 02/11/2020   No results found for: FERRITIN, IRON, TIBC, UIBC, IRONPCTSAT Lab Results  Component Value Date   RETICCTPCT 1.8 01/31/2011   RBC 4.73 02/11/2020   RETICCTABS 79.6 01/31/2011   No results found for: [facility] Lab Results  Component Value Date   IGGSERUM 708 12/27/2015   IGA 131 06/24/2015   IGMSERUM 53 12/27/2015   Lab Results  Component Value Date   TOTALPROTELP 6.4 04/23/2013   ALBUMINELP 60.9 04/23/2013   A1GS 6.7 (H) 04/23/2013   A2GS 11.5 04/23/2013   BETS 6.6 04/23/2013   BETA2SER 4.1 04/23/2013   GAMS 10.2 (L) 04/23/2013   MSPIKE Not Observed 12/27/2015   SPEI * 04/23/2013     Chemistry      Component Value Date/Time   NA 137 02/11/2020 1128   NA 140 08/05/2017 1115   NA 139 06/26/2016 1000   K 4.2 02/11/2020 1128   K 4.4 06/26/2016 1000   CL 97 (L) 02/11/2020 1128   CL 102 04/10/2012 0815   CO2 32 02/11/2020 1128   CO2 27 06/26/2016 1000   BUN 20 02/11/2020 1128   BUN 16 08/05/2017 1115   BUN 13.5 06/26/2016 1000   CREATININE 1.25 (H) 02/11/2020 1128   CREATININE 1.1 06/26/2016 1000      Component Value Date/Time   CALCIUM 9.9 02/11/2020 1128   CALCIUM 9.5 06/26/2016 1000   ALKPHOS 65 02/11/2020 1128   ALKPHOS 83 06/26/2016 1000   AST 19 02/11/2020 1128   AST 20 06/26/2016 1000   ALT 17 02/11/2020 1128   ALT 19 06/26/2016 1000   BILITOT 0.7 02/11/2020 1128   BILITOT 0.69 06/26/2016 1000       Impression and Plan: Charles [name] is a very pleasant 64 yo caucasian male with CLL.   He completed 6 cycles of chemotherapy with Rituxan/Treanda in February 2014. So far, he has done well and there has been no evidence of recurrence.  His white cell count is normal.  However, I have noted that his lymphocytes are going up slowly.  We will have to watch this.  He continues to do well and is being followed closely by his dermatologist .  Apparently, he will need a wider biopsy of a lesion on his back.  We will plan to see him back in another year.  Volanda Napoleon, MD 7/22/20211:01 PM

## 2020-02-15 ENCOUNTER — Other Ambulatory Visit: Payer: Self-pay | Admitting: Hematology & Oncology

## 2020-02-15 ENCOUNTER — Other Ambulatory Visit (HOSPITAL_COMMUNITY): Payer: Self-pay | Admitting: Physician Assistant

## 2020-02-15 DIAGNOSIS — C9110 Chronic lymphocytic leukemia of B-cell type not having achieved remission: Secondary | ICD-10-CM

## 2020-02-15 MED FILL — LISINOPRIL-HCTZ 20-25 MG TA: 20-25 | 90 days supply | Qty: 90 | Fill #0

## 2020-02-15 MED FILL — ROSUVASTATIN CALCIUM 10 MG: 10 | 90 days supply | Qty: 90 | Fill #0

## 2020-02-15 MED FILL — FAMCICLOVIR 500 MG TABLET: 500 | 60 days supply | Qty: 60 | Fill #0

## 2020-02-18 DIAGNOSIS — L988 Other specified disorders of the skin and subcutaneous tissue: Secondary | ICD-10-CM | POA: Diagnosis not present

## 2020-02-18 DIAGNOSIS — D485 Neoplasm of uncertain behavior of skin: Secondary | ICD-10-CM | POA: Diagnosis not present

## 2020-03-02 DIAGNOSIS — Z4802 Encounter for removal of sutures: Secondary | ICD-10-CM | POA: Diagnosis not present

## 2020-03-05 ENCOUNTER — Encounter: Payer: Self-pay | Admitting: Hematology & Oncology

## 2020-03-29 MED FILL — MELOXICAM 15 MG TABLET: 15 | 90 days supply | Qty: 90 | Fill #2

## 2020-04-11 MED FILL — ESOMEPRAZOLE MAG DR 40 MG C: 40 | 90 days supply | Qty: 90 | Fill #2

## 2020-04-20 DIAGNOSIS — R1013 Epigastric pain: Secondary | ICD-10-CM | POA: Diagnosis not present

## 2020-04-20 DIAGNOSIS — R17 Unspecified jaundice: Secondary | ICD-10-CM | POA: Diagnosis not present

## 2020-04-20 DIAGNOSIS — R634 Abnormal weight loss: Secondary | ICD-10-CM | POA: Diagnosis not present

## 2020-04-20 DIAGNOSIS — R82998 Other abnormal findings in urine: Secondary | ICD-10-CM | POA: Diagnosis not present

## 2020-04-20 DIAGNOSIS — E7800 Pure hypercholesterolemia, unspecified: Secondary | ICD-10-CM | POA: Diagnosis not present

## 2020-04-20 DIAGNOSIS — R194 Change in bowel habit: Secondary | ICD-10-CM | POA: Diagnosis not present

## 2020-04-20 DIAGNOSIS — I10 Essential (primary) hypertension: Secondary | ICD-10-CM | POA: Diagnosis not present

## 2020-04-20 DIAGNOSIS — R7303 Prediabetes: Secondary | ICD-10-CM | POA: Diagnosis not present

## 2020-04-25 MED FILL — FAMCICLOVIR 500 MG TABLET: 500 | 60 days supply | Qty: 60 | Fill #1

## 2020-04-26 DIAGNOSIS — R160 Hepatomegaly, not elsewhere classified: Secondary | ICD-10-CM | POA: Diagnosis not present

## 2020-04-27 ENCOUNTER — Other Ambulatory Visit (HOSPITAL_COMMUNITY): Payer: Self-pay | Admitting: Physician Assistant

## 2020-04-27 MED FILL — hydrOXYzine HCL 25 MG TABS: 25 | 10 days supply | Qty: 30 | Fill #0

## 2020-04-28 ENCOUNTER — Telehealth: Payer: Self-pay | Admitting: Nurse Practitioner

## 2020-04-28 NOTE — Telephone Encounter (Signed)
Dr Ardis Hughs The patient is being seen for elevated LFT. He is booked in the first opening. I do not think there is any suggestion I could offer this provider.

## 2020-04-28 NOTE — Telephone Encounter (Signed)
[facility] provider "[employer] PA-C" called requesting a call back to get advise on how to treat patient in the meantime since his OV is scheduled for 05/23/2020.

## 2020-04-29 ENCOUNTER — Encounter: Payer: Self-pay | Admitting: Hematology & Oncology

## 2020-04-29 NOTE — Telephone Encounter (Signed)
Patient is on the wait list for primary GI and all APP's as a high priority. Called the office of [place], [place]. Recommendations faxed for her to review.

## 2020-04-29 NOTE — Telephone Encounter (Signed)
He needs further lab testing: cbc, cmet, INR, Acute (viral) hepatitis panel, ANA, AMA, ASMA.   He needs  to stop his statin and famcyclovir asap.  He needs to be on our "wait" list for a sooner appt with an APP or myself.

## 2020-05-02 DIAGNOSIS — R7989 Other specified abnormal findings of blood chemistry: Secondary | ICD-10-CM | POA: Diagnosis not present

## 2020-05-04 ENCOUNTER — Telehealth: Payer: Self-pay | Admitting: Hematology & Oncology

## 2020-05-04 NOTE — Telephone Encounter (Signed)
Faxed medical records to: [facility]: [phone] P: [phone]  Charles [name] [birth year] Order: [id number]      COPY SCANNED

## 2020-05-23 ENCOUNTER — Ambulatory Visit: Payer: 59 | Admitting: Nurse Practitioner

## 2020-05-23 ENCOUNTER — Other Ambulatory Visit (INDEPENDENT_AMBULATORY_CARE_PROVIDER_SITE_OTHER): Payer: 59

## 2020-05-23 ENCOUNTER — Encounter: Payer: Self-pay | Admitting: Nurse Practitioner

## 2020-05-23 VITALS — BP 112/70 | HR 65 | Ht 69.0 in | Wt 246.0 lb

## 2020-05-23 DIAGNOSIS — R17 Unspecified jaundice: Secondary | ICD-10-CM | POA: Diagnosis not present

## 2020-05-23 DIAGNOSIS — C9110 Chronic lymphocytic leukemia of B-cell type not having achieved remission: Secondary | ICD-10-CM

## 2020-05-23 DIAGNOSIS — R7989 Other specified abnormal findings of blood chemistry: Secondary | ICD-10-CM | POA: Diagnosis not present

## 2020-05-23 LAB — CBC WITH DIFFERENTIAL/PLATELET
Basophils Absolute: 0.1 10*3/uL (ref 0.0–0.1)
Basophils Relative: 1.3 % (ref 0.0–3.0)
Eosinophils Absolute: 0.3 10*3/uL (ref 0.0–0.7)
Eosinophils Relative: 2.7 % (ref 0.0–5.0)
HCT: 40.5 % (ref 39.0–52.0)
Hemoglobin: 14.5 g/dL (ref 13.0–17.0)
Lymphocytes Relative: 35.4 % (ref 12.0–46.0)
Lymphs Abs: 3.5 10*3/uL (ref 0.7–4.0)
MCHC: 35.9 g/dL (ref 30.0–36.0)
MCV: 96.7 fl (ref 78.0–100.0)
Monocytes Absolute: 0.6 10*3/uL (ref 0.1–1.0)
Monocytes Relative: 5.8 % (ref 3.0–12.0)
Neutro Abs: 5.5 10*3/uL (ref 1.4–7.7)
Neutrophils Relative %: 54.8 % (ref 43.0–77.0)
Platelets: 192 10*3/uL (ref 150.0–400.0)
RBC: 4.19 Mil/uL — ABNORMAL LOW (ref 4.22–5.81)
RDW: 14.3 % (ref 11.5–15.5)
WBC: 10 10*3/uL (ref 4.0–10.5)

## 2020-05-23 LAB — BASIC METABOLIC PANEL
BUN: 12 mg/dL (ref 6–23)
CO2: 25 mEq/L (ref 19–32)
Calcium: 9.6 mg/dL (ref 8.4–10.5)
Chloride: 99 mEq/L (ref 96–112)
Creatinine, Ser: 0.93 mg/dL (ref 0.40–1.50)
GFR: 86.12 mL/min (ref >60.00)
Glucose, Bld: 96 mg/dL (ref 70–99)
Potassium: 3.8 mEq/L (ref 3.5–5.1)
Sodium: 136 mEq/L (ref 135–145)

## 2020-05-23 LAB — PROTIME-INR
INR: 1.2 ratio — ABNORMAL HIGH (ref 0.8–1.0)
Prothrombin Time: 13.9 s — ABNORMAL HIGH (ref 9.6–13.1)

## 2020-05-23 LAB — FERRITIN: Ferritin: 301.5 ng/mL (ref 22.0–322.0)

## 2020-05-23 LAB — GAMMA GT: GGT: 111 U/L — ABNORMAL HIGH (ref 7–51)

## 2020-05-23 NOTE — Progress Notes (Signed)
05/23/2020 [name] [id number] 11-25-1955   Chief Complaint: Elevated LFTs  History of Present Illness:  Charles [name] is a 64 year old male with a past medical history of arthritis, shingles, CLL treated with Rituxan/Treanda completed 08/2012 and  in remission followed by Dr. Marin Olp and GERD.  S/p left knee arthroscopy with debriedment left knee 12/24/2019. He presents today as referred by his PCP Charles Mons PA-C for further evaluation regarding acute transaminitis.  Mid September 2021, he noticed his urine looked like dark tea.  He thought he was dehydrated and he drink more fluids.  He also noticed his stools were much lighter in color, described as a beige stool.  He felt hot and cold without sweats for several days.  No documented fever.  He described feeling as if food was sitting in his lower chest without actually being stuck, he could still drink fluids and eat food. No N/V.  Approximately 2 weeks later, he appeared jaundiced with generalized itchiness and he contacted his PCP. Labs showed elevated T. Bili, alk phos, AST and ALT levels. A RUQ sonogram showed normal gallbladder and hepatomegaly with nodular contour suspicious for cirrhosis. Portal vein was patent. See full sonogram report below.   Laboratory studies   04/20/2020 showed an Alk phos level of 139.  AST 666.  ALT 938.  Total bili 13.8. Cr. 1.39.  Laboratory studies 05/02/2020 showed an Alk phos level of 174.  AST 61.  ALT 70.  Total bili 5.8. Cr. 0.99.  ANA negative.  AMA < 20.  SMA7.  Hepatitis A IgM negative.  Hepatitis B surface antigen negative.  Hepatitis B core IgM antibody negative.  Hepatitis C antibody negative.  CBC with plt count was not done. INR was not done.   He was advised by Dr. Ardis Hughs to discontinue Lipitor and Famciclovir which he has taken for the past few years.  He denies starting any new medications within the past 6 months.  No herbal supplements.  No antibiotics.  He takes ASA 44m daily.  He takes Meloxicam daily for at least 2 years. He drinks 1 beer monthly.  He drinks a small amount of liquor maybe once yearly.  No family history of liver disease.  His urine is back to normal yellow color.  His bowel movements are now darker brown without rectal bleeding or melena.  His pruritus has improved.  His appetite has improved.  He has mild fatigue.  He has occasional heartburn. He remains on Esomeprazole 457mQD. No upper abdominal pain. No lower abdominal pain. He underwent a Cologuard test 07/28/2015 and 01/14/2019 which were negative.  No other complaints today.   RUQ ultrasound 04/26/2020: PANCREAS:  No focal abnormalities are identified.  Visualization is limited.   VASCULATURE:  No abdominal aortic aneurysm.  Visualized IVC is patent.  Portal vein is patent with normal flow direction.   HEPATOBILIARY:  Liver: Liver is enlarged measuring 20 cm and demonstrates a nodular contour. Mildly echogenic parenchyma.  Gallbladder: Decompressed. No pericholecystic fluid. Negative sonographic Murphy's sign.  CBD: Normal.  No intrahepatic biliary ductal dilatation.   RIGHT KIDNEY:  Right kidney is of normal size.  No hydronephrosis.  No suspicious masses.  Normal renal echotexture.   IMPRESSION:  1. Hepatomegaly with nodular contour suspicious for cirrhosis. No visible lesion.  2. Additional findings as detailed above.   Past Medical History:  Diagnosis Date   Allergy    Arthritis    CLL (chronic lymphoblastic leukemia) 08/03/2011  CLL (chronic lymphocytic leukemia) ([place]) 03/18/2012   GERD (gastroesophageal reflux disease)    Shingles    Ulcer    Past Surgical History:  Procedure Laterality Date   HERNIA REPAIR     KNEE ARTHROSCOPY WITH LATERAL MENISECTOMY Left 12/24/2019   Procedure: KNEE ARTHROSCOPY WITH PARTIAL LATERAL MENISECTOMY;  Surgeon: Mcarthur Rossetti, MD;  Location: [facility];  Service: Orthopedics;  Laterality: Left;   KNEE  SURGERY     SHOULDER SURGERY     WRIST SURGERY       Current Medications, Allergies, Past Medical History, Past Surgical History, Family History and Social History were reviewed in [employer] record.   Review of Systems:   Constitutional: See HPI.  Respiratory: Negative for shortness of breath.   Cardiovascular: Negative for chest pain, palpitations and leg swelling.  Gastrointestinal: See HPI.  Musculoskeletal: Negative for back pain or muscle aches.  Neurological: Negative for dizziness, headaches or paresthesias.    Physical Exam: BP 112/70    Pulse 65    Ht 5' 9"  (1.753 m)    Wt 246 lb (111.6 kg)    BMI 36.33 kg/m  General: Well developed 64 year old male in no acute distress. Head: Normocephalic and atraumatic. Eyes: Mild scleral icterus. Conjunctiva pink . Ears: Normal auditory acuity. Mouth: Dentures. No ulcers or lesions.  Lungs: Clear throughout to auscultation. Heart: Regular rate and rhythm, no murmur. Abdomen: Soft, nontender and nondistended. No masses or hepatomegaly. No ascites. Normal bowel sounds x 4 quadrants.  Rectal: Deferred.  Musculoskeletal: Symmetrical with no gross deformities. Extremities: No edema. Neurological: Alert oriented x 4. No focal deficits. No asterixis.  Psychological: Alert and cooperative. Normal mood and affect Skin: Mild jaundice.   Assessment and Recommendations:  13.  64 year old male with acute transaminitis consistent with acute cholestasis and jaundice. RUQ sonogram 10/5 showed hepatomegaly and nodular contour concerning for possible cirrhosis.  -CBC, hepatic panel, IgG, GGT, iron, ferritin, ceruloplasmin, CMV IgM, EBV IgM, Hepatitis A total antibody, hepatitis B core total antibody and hepatitis B surface antibody -He will need hepatitis A and hepatitis B vaccinations if not immune -Abdominal MRI/MRCP to further evaluate the liver and biliary tree -Remain off Lipitor and Famciclovir for now -Further  recommendations will be determined after the above lab and MRI/MRCP results received and reviewed -Patient to follow-up in the office with Dr. Ardis Hughs in 4 weeks  2. GERD -Continue Esomeprazole for now  3. Colon cancer screening. Cologuard 12/2018 was negative.   4. CLL in remission

## 2020-05-23 NOTE — Patient Instructions (Signed)
If you are age 65 or older, your body mass index should be between 23-30. Your Body mass index is 36.33 kg/m. If this is out of the aforementioned range listed, please consider follow up with your Primary Care Provider.  If you are age 76 or younger, your body mass index should be between 19-25. Your Body mass index is 36.33 kg/m. If this is out of the aformentioned range listed, please consider follow up with your Primary Care Provider.   Your provider has requested that you go to the basement level for lab work before leaving today. Press "B" on the elevator. The lab is located at the first door on the left as you exit the elevator.  Due to recent changes in healthcare laws, you may see the results of your imaging and laboratory studies on MyChart before your provider has had a chance to review them.  We understand that in some cases there may be results that are confusing or concerning to you. Not all laboratory results come back in the same time frame and the provider may be waiting for multiple results in order to interpret others.  Please give [place] 48 hours in order for your provider to thoroughly review all the results before contacting the office for clarification of your results.   You have been scheduled for an MRI at [facility] on 05/30/20. Your appointment time is 8:00 am. Please arrive 30 minutes prior to your appointment time for registration purposes. Please make certain not to have anything to eat or drink 4 hours prior to your test. In addition, if you have any metal in your body, have a pacemaker or defibrillator, please be sure to let your ordering physician know. This test typically takes 45 minutes to 1 hour to complete. Should you need to reschedule, please call [phone] to do so.  Call the office if your urine gets dark in color or if your stool gets light in color.  You have been scheduled to follow up with Carl Best, CRNP on June 23, 2020 at 2:00 pm

## 2020-05-24 LAB — CMV IGM: CMV IgM: <30 AU/mL

## 2020-05-24 LAB — CERULOPLASMIN: Ceruloplasmin: 31 mg/dL (ref 18–36)

## 2020-05-24 LAB — HEPATITIS B CORE ANTIBODY, TOTAL: Hep B Core Total Ab: NONREACTIVE

## 2020-05-24 LAB — HEPATITIS A ANTIBODY, TOTAL: Hepatitis A AB,Total: NONREACTIVE

## 2020-05-24 LAB — HEPATITIS B SURFACE ANTIBODY,QUALITATIVE: Hep B S Ab: NONREACTIVE

## 2020-05-24 LAB — EPSTEIN-BARR VIRUS VCA, IGM: EBV VCA IgM: <36 U/mL

## 2020-05-24 LAB — ALPHA-1-ANTITRYPSIN: A-1 Antitrypsin, Ser: 175 mg/dL (ref 83–199)

## 2020-05-24 LAB — IGG: IgG (Immunoglobin G), Serum: 1038 mg/dL (ref 600–1540)

## 2020-05-25 ENCOUNTER — Other Ambulatory Visit (INDEPENDENT_AMBULATORY_CARE_PROVIDER_SITE_OTHER): Payer: 59

## 2020-05-25 DIAGNOSIS — R7989 Other specified abnormal findings of blood chemistry: Secondary | ICD-10-CM | POA: Diagnosis not present

## 2020-05-25 DIAGNOSIS — R17 Unspecified jaundice: Secondary | ICD-10-CM

## 2020-05-25 DIAGNOSIS — C9110 Chronic lymphocytic leukemia of B-cell type not having achieved remission: Secondary | ICD-10-CM

## 2020-05-25 LAB — HEPATIC FUNCTION PANEL
ALT: 21 U/L (ref 0–53)
AST: 27 U/L (ref 0–37)
Albumin: 4.4 g/dL (ref 3.5–5.2)
Alkaline Phosphatase: 112 U/L (ref 39–117)
Bilirubin, Direct: 0.7 mg/dL — ABNORMAL HIGH (ref 0.0–0.3)
Total Bilirubin: 1.6 mg/dL — ABNORMAL HIGH (ref 0.2–1.2)
Total Protein: 7.7 g/dL (ref 6.0–8.3)

## 2020-05-25 NOTE — Progress Notes (Signed)
I agree with the above note, plan 

## 2020-05-30 ENCOUNTER — Ambulatory Visit (HOSPITAL_COMMUNITY): Payer: 59

## 2020-05-30 MED FILL — LISINOPRIL-HCTZ 20-25 MG TA: 20-25 | 90 days supply | Qty: 90 | Fill #1

## 2020-05-31 ENCOUNTER — Other Ambulatory Visit: Payer: Self-pay

## 2020-05-31 ENCOUNTER — Ambulatory Visit (HOSPITAL_COMMUNITY)
Admission: RE | Admit: 2020-05-31 | Discharge: 2020-05-31 | Disposition: A | Discharge status: Home / Self Care | Payer: 59 | Source: Ambulatory Visit | Attending: Nurse Practitioner | Admitting: Nurse Practitioner

## 2020-05-31 DIAGNOSIS — K449 Diaphragmatic hernia without obstruction or gangrene: Secondary | ICD-10-CM | POA: Diagnosis not present

## 2020-05-31 DIAGNOSIS — K7689 Other specified diseases of liver: Secondary | ICD-10-CM | POA: Diagnosis not present

## 2020-05-31 DIAGNOSIS — K760 Fatty (change of) liver, not elsewhere classified: Secondary | ICD-10-CM | POA: Diagnosis not present

## 2020-05-31 DIAGNOSIS — R17 Unspecified jaundice: Secondary | ICD-10-CM | POA: Insufficient documentation

## 2020-05-31 DIAGNOSIS — C9110 Chronic lymphocytic leukemia of B-cell type not having achieved remission: Secondary | ICD-10-CM | POA: Insufficient documentation

## 2020-05-31 DIAGNOSIS — R161 Splenomegaly, not elsewhere classified: Secondary | ICD-10-CM | POA: Diagnosis not present

## 2020-05-31 DIAGNOSIS — R7989 Other specified abnormal findings of blood chemistry: Secondary | ICD-10-CM | POA: Diagnosis not present

## 2020-05-31 MED ORDER — GADOBUTROL 1 MMOL/ML IV SOLN
10.0000 mL | Freq: Once | INTRAVENOUS | Status: AC | PRN
  Administered 2020-05-31: 10 mL via INTRAVENOUS

## 2020-06-03 ENCOUNTER — Other Ambulatory Visit: Payer: Self-pay

## 2020-06-03 DIAGNOSIS — R17 Unspecified jaundice: Secondary | ICD-10-CM

## 2020-06-03 DIAGNOSIS — R7989 Other specified abnormal findings of blood chemistry: Secondary | ICD-10-CM

## 2020-06-14 ENCOUNTER — Other Ambulatory Visit: Payer: 59

## 2020-06-14 DIAGNOSIS — R7989 Other specified abnormal findings of blood chemistry: Secondary | ICD-10-CM

## 2020-06-14 DIAGNOSIS — R17 Unspecified jaundice: Secondary | ICD-10-CM

## 2020-06-14 LAB — HEPATIC FUNCTION PANEL
ALT: 15 U/L (ref 0–53)
AST: 20 U/L (ref 0–37)
Albumin: 4.5 g/dL (ref 3.5–5.2)
Alkaline Phosphatase: 89 U/L (ref 39–117)
Bilirubin, Direct: 0.3 mg/dL (ref 0.0–0.3)
Total Bilirubin: 1.2 mg/dL (ref 0.2–1.2)
Total Protein: 7.5 g/dL (ref 6.0–8.3)

## 2020-06-23 ENCOUNTER — Other Ambulatory Visit (INDEPENDENT_AMBULATORY_CARE_PROVIDER_SITE_OTHER): Payer: 59

## 2020-06-23 ENCOUNTER — Encounter: Payer: Self-pay | Admitting: Nurse Practitioner

## 2020-06-23 ENCOUNTER — Ambulatory Visit (INDEPENDENT_AMBULATORY_CARE_PROVIDER_SITE_OTHER): Payer: 59 | Admitting: Nurse Practitioner

## 2020-06-23 ENCOUNTER — Other Ambulatory Visit: Payer: Self-pay | Admitting: Nurse Practitioner

## 2020-06-23 VITALS — BP 132/62 | HR 68 | Ht 68.0 in | Wt 250.4 lb

## 2020-06-23 DIAGNOSIS — Z1211 Encounter for screening for malignant neoplasm of colon: Secondary | ICD-10-CM

## 2020-06-23 DIAGNOSIS — R7989 Other specified abnormal findings of blood chemistry: Secondary | ICD-10-CM

## 2020-06-23 DIAGNOSIS — Z800 Family history of malignant neoplasm of digestive organs: Secondary | ICD-10-CM | POA: Diagnosis not present

## 2020-06-23 LAB — HEPATIC FUNCTION PANEL
ALT: 16 U/L (ref 0–53)
AST: 20 U/L (ref 0–37)
Albumin: 4.4 g/dL (ref 3.5–5.2)
Alkaline Phosphatase: 89 U/L (ref 39–117)
Bilirubin, Direct: 0.3 mg/dL (ref 0.0–0.3)
Total Bilirubin: 0.9 mg/dL (ref 0.2–1.2)
Total Protein: 7.4 g/dL (ref 6.0–8.3)

## 2020-06-23 LAB — GAMMA GT: GGT: 49 U/L (ref 7–51)

## 2020-06-23 MED ORDER — SUPREP BOWEL PREP KIT 17.5-3.13-1.6 GM/177ML PO SOLN: 1.0000 | ORAL | 0 refills | Status: DC

## 2020-06-23 MED FILL — SUPREP BOWEL PREP KIT: 17.5-3.13-1 | 1 days supply | Qty: 354 | Fill #0

## 2020-06-23 NOTE — Patient Instructions (Signed)
If you are age 65 or older, your body mass index should be between 23-30. Your Body mass index is 38.07 kg/m. If this is out of the aforementioned range listed, please consider follow up with your Primary Care Provider.  If you are age 24 or younger, your body mass index should be between 19-25. Your Body mass index is 38.07 kg/m. If this is out of the aformentioned range listed, please consider follow up with your Primary Care Provider.   We have sent the following medications to your pharmacy for you to pick up at your convenience: [place] have been scheduled for an endoscopy and colonoscopy. Please follow the written instructions given to you at your visit today. Please pick up your prep supplies at the pharmacy within the next 1-3 days. If you use inhalers (even only as needed), please bring them with you on the day of your procedure.  Ok to have Shingrix Vaccine.   Your provider has requested that you go to the basement level for lab work before leaving today. Press "B" on the elevator. The lab is located at the first door on the left as you exit the elevator.   Thank you for choosing me and [facility].  [place]

## 2020-06-23 NOTE — Progress Notes (Signed)
0.    06/23/2020 MARCELUS DUBBERLY [id number] March 11, 1956   Chief Complaint: Follow-up liver tests  History of Present Illness: Smitty Ackerley is a 64 year old male with a past medical history of arthritis, GERD, shingles, CLL treated with Rituxan/Treanda completed 08/2012 in remission followed by Dr. Marin Olp.  S/P left knee arthroscopy with debriedment left knee 12/24/2019. I saw Mr. Galluzzo in the office on 05/23/2020 for further evaluation for acute transaminitis, refer to this consult note for comprehensive history review.  He presents today for further follow-up regarding acute transaminitis with associated jaundice of unclear etiology.  His LFTs have normalized.  See results below.  He underwent an abdominal MRI with MRCP with and without contrast 06/01/2020 which showed mild hepatic steatosis and hepatomegaly, nodular contour to the liver suggestive of cirrhosis, no evidence of hepatoma or biliary ductal dilatation was noted.  Mild splenomegaly was present.  A moderate hiatal hernia was also noted.  There was motion artifact throughout.  He reports feeling quite well.  His urine and stools are normal color.  No nausea.  No upper or lower abdominal pain.  He is eating a healthier diet.  Dr. Ardis Hughs discontinued his Famciclovir and he questioned if he can receive a shingles vaccination.  He remains off Lipitor.  He previously drank 1 beer monthly, however, no alcohol since his LFTs were elevated. No other complaints at this time.   He denies ever having a screening colonoscopy.  Cologuard 12/2018 was negative.  His brother died from colon cancer at the age of 62.  Laboratory studies   04/20/2020:  Alk phos level of 139.  AST 666.  ALT 938.  Total bili 13.8. Cr. 1.39.  Laboratory studies 05/02/2020:  Alk phos level of 174.  AST 61.  ALT 70.  Total bili 5.8. Cr. 0.99.  ANA negative.  AMA < 20.  SMA 7.  Hepatitis A IgM negative.  Hepatitis B surface antigen negative. Hepatitis B core IgM antibody  negative.  Hepatitis C antibody negative.  Lipitor and Famciclovir were discontinued on 04/29/2020.  Laboratory studies 05/23/2020: WBC 10.  Hemoglobin 14.5.  Hematocrit 40.5.  Platelet 192.  IgG 1038.  Hepatitis A total antibody nonreactive.  Hepatitis B core total antibody nonreactive.  Hepatitis B surface antibody nonreactive.  Ceruloplasmin 31.  Alpha-1 antitrypsin 175.  GGT 111.  EBV IgM less than 36.  CMV IgM less than 30. Laboratory studies 05/25/2020: Alk phos 112. AST 27.  ALT 21.  Total bili 1.6.  Direct bili 0.7. Laboratory studies 06/14/2020: Alk phos 89.  AST 20.  ALT 15.  Total bili 1.2.  Direct bili 0.3.  Abdominal sonogram 04/26/2020: showed hepatomegaly with nodular contour suspicious for cirrhosis. No visible lesion.   Abdominal MRI with MRCP w/wo contrast 06/01/2020: 1. Examination is generally limited by breath motion artifact throughout. 2. Mild hepatic steatosis and hepatomegaly. The left lobe of the liver is shrunken and there is a prominent caudate lobe. Morphology suggests cirrhosis without overtly nodular contour or fibrotic enhancement pattern. 3. No mass or suspicious contrast enhancement. 4. No biliary ductal dilatation per report of cholestasis. 5. Mild splenomegaly. 6. Moderate hiatal hernia with intrathoracic position of the gastric Fundus.  Current Medications, Allergies, Past Medical History, Past Surgical History, Family History and Social History were reviewed in [employer] record.  Current Outpatient Medications on File Prior to Visit  Medication Sig Dispense Refill  . aspirin 81 MG tablet Take 81 mg by mouth daily.    Marland Kitchen  cetirizine (ZYRTEC) 10 MG tablet Take 10 mg by mouth daily.    . Cholecalciferol (VITAMIN D3) 2000 UNITS TABS Take 2,000 Units by mouth.    . esomeprazole (NEXIUM) 40 MG capsule Take 1 capsule (40 mg total) by mouth daily. Before bed 90 capsule 3  . ketoconazole (NIZORAL) 2 % cream SMARTSIG:1 Application  Topical 1 to 2 Times Daily    . lisinopril-hydrochlorothiazide (PRINZIDE,ZESTORETIC) 20-25 MG tablet Take 1 tablet by mouth daily. 90 tablet 3  . meloxicam (MOBIC) 15 MG tablet TAKE 1 TABLET BY MOUTH DAILY. 30 tablet 11  . rosuvastatin (CRESTOR) 10 MG tablet Take 1 tablet by mouth at bedtime.     No current facility-administered medications on file prior to visit.   No Known Allergies   Review of Systems:   Constitutional: Negative for fever, sweats, chills or weight loss.  Respiratory: Negative for shortness of breath.   Cardiovascular: Negative for chest pain, palpitations and leg swelling.  Gastrointestinal: See HPI.  Musculoskeletal: Negative for back pain or muscle aches.  Neurological: Negative for dizziness, headaches or paresthesias.    Physical Exam: BP 132/62 (BP Location: Left Arm, Patient Position: Sitting, Cuff Size: Normal)   Pulse 68   Ht 5' 8"  (1.727 m) Comment: height measured without shoes  Wt 250 lb 6 oz (113.6 kg)   BMI 38.07 kg/m   General: Well developed 64 year old male in no acute distress. Head: Normocephalic and atraumatic. Eyes: No scleral icterus. Conjunctiva pink . Ears: Normal auditory acuity. Lungs: Clear throughout to auscultation. Heart: Regular rate and rhythm, no murmur. Abdomen: Soft, nontender and nondistended. No masses or hepatomegaly. Normal bowel sounds x 4 quadrants.  Rectal: Deferred.  Musculoskeletal: Symmetrical with no gross deformities. Extremities: No edema. Neurological: Alert oriented x 4. No focal deficits.  Psychological: Alert and cooperative. Normal mood and affect  Assessment and Recommendations:  22.  64 year old male with acute transaminitis consistent with acute cholestasis and jaundice of unclear etiology, resolved. LFTs normalized. He remains off Lipitor and Famciclovir.  RUQ sonogram 10/5 showed hepatomegaly and nodular contour concerning for possible cirrhosis which is unlikely with PLT 192. Abd MRI/MRCP showed  hepatomegaly with steatosis, nodular liver contour suggestive of cirrhosis and mild splenomegaly with motion artifact.  -repeat hepatic panel, GGT -Ok to have Shinrix (recombinant) vaccination, to discuss further with Dr. Marin Olp. No live vaccines (History of CLL) -Dr. Ardis Hughs to verify if patient can restart low dose Lipitor  -Patient to call our office if he develops pruritis, jaundice or darker colored urine  -Discussed hepatic steatosis at risk for cirrhosis. Avoid weight gain. Heart healthy diet recommended.  2. GERD -Continue Esomeprazole   3. Colon cancer screening. Cologuard 01/28/2019 was negative. Brother died from colon cancer age 37. -Colonoscopy recommended. Colonoscopy benefits and risks discussed including risk with sedation, risk of bleeding, perforation and infection   4. CLL in remission

## 2020-06-24 ENCOUNTER — Telehealth: Payer: Self-pay

## 2020-06-24 ENCOUNTER — Other Ambulatory Visit: Payer: Self-pay | Admitting: Gastroenterology

## 2020-06-24 ENCOUNTER — Ambulatory Visit: Payer: 59 | Admitting: Gastroenterology

## 2020-06-24 DIAGNOSIS — R7989 Other specified abnormal findings of blood chemistry: Secondary | ICD-10-CM

## 2020-06-24 MED ORDER — ROSUVASTATIN CALCIUM 5 MG PO TABS: 5.0000 mg | ORAL_TABLET | Freq: Every day | ORAL | 6 refills | Status: DC

## 2020-06-24 MED FILL — ROSUVASTATIN CALCIUM 5 MG T: 5 | 30 days supply | Qty: 30 | Fill #0

## 2020-06-24 NOTE — Telephone Encounter (Signed)
Left message on machine to call back  

## 2020-06-24 NOTE — Telephone Encounter (Signed)
The pt has been advised and prescription sent to the pharmacy. Also the lab order has been entered. He will come in 4 weeks for repeat.

## 2020-06-24 NOTE — Telephone Encounter (Signed)
Author: Milus Banister, MD Service: Gastroenterology Author Type: Physician  Filed: 06/24/2020 7:57 AM Encounter Date: 06/23/2020 Status: Signed  Editor: Milus Banister, MD (Physician)     Show:Clear all [x] Manual[] Template[] Copied  Added by: [x] Milus Banister, MD  [] Hover for details I think restarting statin at low dose is probably safe.  I believe he was actually on Crestor at 10 mg once daily for many many years.  Let him know it is safe to resume that medicine at 5 mg once daily to start, offer new prescription if needed for once daily 5 mg pills, 1 month with 6 refills.  He needs LFTs in 4 weeks after starting the medicine.  Thanks

## 2020-06-24 NOTE — Telephone Encounter (Signed)
-----   Message from Milus Banister, MD sent at 06/24/2020  7:55 AM EST -----   ----- Message ----- From: Noralyn Pick, NP Sent: 06/23/2020   3:02 PM EST To: Milus Banister, MD

## 2020-06-24 NOTE — Progress Notes (Signed)
I think restarting statin at low dose is probably safe.  I believe he was actually on Crestor at 10 mg once daily for many many years.  Let him know it is safe to resume that medicine at 5 mg once daily to start, offer new prescription if needed for once daily 5 mg pills, 1 month with 6 refills.  He needs LFTs in 4 weeks after starting the medicine.  Thanks

## 2020-06-24 NOTE — Telephone Encounter (Signed)
Patient returned your call.

## 2020-06-27 MED FILL — MELOXICAM 15 MG TABLET: 15 | 90 days supply | Qty: 90 | Fill #3

## 2020-06-30 ENCOUNTER — Encounter: Payer: Self-pay | Admitting: Gastroenterology

## 2020-07-05 ENCOUNTER — Other Ambulatory Visit: Payer: Self-pay

## 2020-07-05 ENCOUNTER — Ambulatory Visit (AMBULATORY_SURGERY_CENTER): Payer: 59 | Admitting: Gastroenterology

## 2020-07-05 ENCOUNTER — Encounter: Payer: Self-pay | Admitting: Gastroenterology

## 2020-07-05 VITALS — BP 113/61 | HR 50 | Temp 98.6°F | Resp 12 | Ht 68.0 in | Wt 250.0 lb

## 2020-07-05 DIAGNOSIS — Z1211 Encounter for screening for malignant neoplasm of colon: Secondary | ICD-10-CM | POA: Diagnosis not present

## 2020-07-05 DIAGNOSIS — E785 Hyperlipidemia, unspecified: Secondary | ICD-10-CM | POA: Diagnosis not present

## 2020-07-05 DIAGNOSIS — D122 Benign neoplasm of ascending colon: Secondary | ICD-10-CM

## 2020-07-05 DIAGNOSIS — I10 Essential (primary) hypertension: Secondary | ICD-10-CM | POA: Diagnosis not present

## 2020-07-05 DIAGNOSIS — Z800 Family history of malignant neoplasm of digestive organs: Secondary | ICD-10-CM

## 2020-07-05 DIAGNOSIS — D123 Benign neoplasm of transverse colon: Secondary | ICD-10-CM | POA: Diagnosis not present

## 2020-07-05 DIAGNOSIS — D125 Benign neoplasm of sigmoid colon: Secondary | ICD-10-CM

## 2020-07-05 DIAGNOSIS — K219 Gastro-esophageal reflux disease without esophagitis: Secondary | ICD-10-CM | POA: Diagnosis not present

## 2020-07-05 MED ORDER — SODIUM CHLORIDE 0.9 % IV SOLN: 500.0000 mL | INTRAVENOUS | Status: DC

## 2020-07-05 NOTE — Patient Instructions (Signed)
Handouts given for polyps and diverticulosis.  YOU HAD AN ENDOSCOPIC PROCEDURE TODAY AT THE [facility]:   Refer to the procedure report that was given to you for any specific questions about what was found during the examination.  If the procedure report does not answer your questions, please call your gastroenterologist to clarify.  If you requested that your care partner not be given the details of your procedure findings, then the procedure report has been included in a sealed envelope for you to review at your convenience later.  YOU SHOULD EXPECT: Some feelings of bloating in the abdomen. Passage of more gas than usual.  Walking can help get rid of the air that was put into your GI tract during the procedure and reduce the bloating. If you had a lower endoscopy (such as a colonoscopy or flexible sigmoidoscopy) you may notice spotting of blood in your stool or on the toilet paper. If you underwent a bowel prep for your procedure, you may not have a normal bowel movement for a few days.  Please Note:  You might notice some irritation and congestion in your nose or some drainage.  This is from the oxygen used during your procedure.  There is no need for concern and it should clear up in a day or so.  SYMPTOMS TO REPORT IMMEDIATELY:  Following lower endoscopy (colonoscopy or flexible sigmoidoscopy):  Excessive amounts of blood in the stool  Significant tenderness or worsening of abdominal pains  Swelling of the abdomen that is new, acute  Fever of 100F or higher  For urgent or emergent issues, a gastroenterologist can be reached at any hour by calling [phone]. Do not use MyChart messaging for urgent concerns.    DIET:  We do recommend a small meal at first, but then you may proceed to your regular diet.  Drink plenty of fluids but you should avoid alcoholic beverages for 24 hours.  ACTIVITY:  You should plan to take it easy for the rest of today and you should NOT DRIVE  or use heavy machinery until tomorrow (because of the sedation medicines used during the test).    FOLLOW UP: Our staff will call the number listed on your records 48-72 hours following your procedure to check on you and address any questions or concerns that you may have regarding the information given to you following your procedure. If we do not reach you, we will leave a message.  We will attempt to reach you two times.  During this call, we will ask if you have developed any symptoms of COVID 19. If you develop any symptoms (ie: fever, flu-like symptoms, shortness of breath, cough etc.) before then, please call [phone].  If you test positive for Covid 19 in the 2 weeks post procedure, please call and report this information to us.    If any biopsies were taken you will be contacted by phone or by letter within the next 1-3 weeks.  Please call us at [phone] if you have not heard about the biopsies in 3 weeks.    SIGNATURES/CONFIDENTIALITY: You and/or your care partner have signed paperwork which will be entered into your electronic medical record.  These signatures attest to the fact that that the information above on your After Visit Summary has been reviewed and is understood.  Full responsibility of the confidentiality of this discharge information lies with you and/or your care-partner.  

## 2020-07-05 NOTE — Progress Notes (Signed)
Called to room to assist during endoscopic procedure.  Patient ID and intended procedure confirmed with present staff. Received instructions for my participation in the procedure from the performing physician.  

## 2020-07-05 NOTE — Op Note (Signed)
[facility] Patient Name: Boston Cookson Procedure Date: 07/05/2020 11:03 AM MRN: [id number] Endoscopist: Milus Banister , MD Age: 64 Referring MD:  Date of Birth: 1956-03-06 Gender: Male Account #: [id number] Procedure:                Colonoscopy Indications:              Screening in patient at increased risk: Family                            history of 1st-degree relative with colorectal                            cancer; brother had colon cancer (in his early 77s) Medicines:                Monitored Anesthesia Care Procedure:                Pre-Anesthesia Assessment:                           - Prior to the procedure, a History and Physical                            was performed, and patient medications and                            allergies were reviewed. The patient's tolerance of                            previous anesthesia was also reviewed. The risks                            and benefits of the procedure and the sedation                            options and risks were discussed with the patient.                            All questions were answered, and informed consent                            was obtained. Prior Anticoagulants: The patient has                            taken no previous anticoagulant or antiplatelet                            agents. ASA Grade Assessment: II - A patient with                            mild systemic disease. After reviewing the risks                            and benefits, the patient was deemed in  satisfactory condition to undergo the procedure.                           After obtaining informed consent, the colonoscope                            was passed under direct vision. Throughout the                            procedure, the patient's blood pressure, pulse, and                            oxygen saturations were monitored continuously. The                            Colonoscope  was introduced through the anus and                            advanced to the the cecum, identified by                            appendiceal orifice and ileocecal valve. The                            colonoscopy was performed without difficulty. The                            patient tolerated the procedure well. The quality                            of the bowel preparation was good. The ileocecal                            valve, appendiceal orifice, and rectum were                            photographed. Scope In: 11:14:57 AM Scope Out: 11:37:33 AM Scope Withdrawal Time: 0 hours 13 minutes 52 seconds  Total Procedure Duration: 0 hours 22 minutes 36 seconds  Findings:                 Three sessile polyps were found in the sigmoid                            colon, transverse colon and ascending colon. The                            polyps were 3 to 6 mm in size. These polyps were                            removed with a cold snare. Resection and retrieval                            were complete.  Multiple small and large-mouthed diverticula were                            found in the left colon.                           The exam was otherwise without abnormality on                            direct and retroflexion views. Complications:            No immediate complications. Estimated blood loss:                            None. Estimated Blood Loss:     Estimated blood loss: none. Impression:               - Three 3 to 6 mm polyps in the sigmoid colon, in                            the transverse colon and in the ascending colon,                            removed with a cold snare. Resected and retrieved.                           - Diverticulosis in the left colon.                           - The examination was otherwise normal on direct                            and retroflexion views. Recommendation:           - Patient has a contact number  available for                            emergencies. The signs and symptoms of potential                            delayed complications were discussed with the                            patient. Return to normal activities tomorrow.                            Written discharge instructions were provided to the                            patient.                           - Resume previous diet.                           - Continue present medications.                           -  Await pathology results. Milus Banister, MD 07/05/2020 11:47:46 AM This report has been signed electronically.

## 2020-07-05 NOTE — Progress Notes (Signed)
Report to PACU, RN, vss, BBS= Clear.  

## 2020-07-07 ENCOUNTER — Telehealth: Payer: Self-pay

## 2020-07-07 NOTE — Telephone Encounter (Signed)
  Follow up Call-  Call back number 07/05/2020  Post procedure Call Back phone  # [phone]  Permission to leave phone message Yes  Some recent data might be hidden     Patient questions:  Do you have a fever, pain , or abdominal swelling? No. Pain Score  0 *  Have you tolerated food without any problems? Yes.    Have you been able to return to your normal activities? Yes.    Do you have any questions about your discharge instructions: Diet   No. Medications  No. Follow up visit  No.  Do you have questions or concerns about your Care? No.  Actions: * If pain score is 4 or above: No action needed, pain <4.  1. Have you developed a fever since your procedure? no  2.   Have you had an respiratory symptoms (SOB or cough) since your procedure? no  3.   Have you tested positive for COVID 19 since your procedure no  4.   Have you had any family members/close contacts diagnosed with the COVID 19 since your procedure?  no   If yes to any of these questions please route to Joylene John, RN and Joella Prince, RN

## 2020-07-12 MED FILL — ESOMEPRAZOLE MAG DR 40 MG C: 40 | 90 days supply | Qty: 90 | Fill #3

## 2020-07-13 ENCOUNTER — Encounter: Payer: Self-pay | Admitting: Gastroenterology

## 2020-07-13 ENCOUNTER — Other Ambulatory Visit (HOSPITAL_COMMUNITY): Payer: Self-pay | Admitting: Internal Medicine

## 2020-07-13 MED FILL — SHINGRIX 50 MCG SUS: 50 | 1 days supply | Qty: 1 | Fill #0

## 2020-07-19 ENCOUNTER — Other Ambulatory Visit (INDEPENDENT_AMBULATORY_CARE_PROVIDER_SITE_OTHER): Payer: 59

## 2020-07-19 DIAGNOSIS — R7989 Other specified abnormal findings of blood chemistry: Secondary | ICD-10-CM | POA: Diagnosis not present

## 2020-07-19 LAB — HEPATIC FUNCTION PANEL
ALT: 15 U/L (ref 0–53)
AST: 19 U/L (ref 0–37)
Albumin: 4.3 g/dL (ref 3.5–5.2)
Alkaline Phosphatase: 68 U/L (ref 39–117)
Bilirubin, Direct: 0.2 mg/dL (ref 0.0–0.3)
Total Bilirubin: 0.7 mg/dL (ref 0.2–1.2)
Total Protein: 7.1 g/dL (ref 6.0–8.3)

## 2020-07-20 ENCOUNTER — Other Ambulatory Visit: Payer: Self-pay

## 2020-07-20 DIAGNOSIS — R7989 Other specified abnormal findings of blood chemistry: Secondary | ICD-10-CM

## 2020-07-25 MED FILL — ROSUVASTATIN CALCIUM 5 MG T: 5 | 30 days supply | Qty: 30 | Fill #1

## 2020-08-05 DIAGNOSIS — D126 Benign neoplasm of colon, unspecified: Secondary | ICD-10-CM | POA: Insufficient documentation

## 2020-08-29 ENCOUNTER — Other Ambulatory Visit (HOSPITAL_COMMUNITY): Payer: Self-pay | Admitting: Physician Assistant

## 2020-08-29 MED FILL — ROSUVASTATIN CALCIUM 5 MG T: 5 | 30 days supply | Qty: 30 | Fill #2

## 2020-08-29 MED FILL — LISINOPRIL-HCTZ 20-25 MG TA: 20-25 | 90 days supply | Qty: 90 | Fill #0

## 2020-08-31 DIAGNOSIS — Z23 Encounter for immunization: Secondary | ICD-10-CM | POA: Diagnosis not present

## 2020-08-31 DIAGNOSIS — R7303 Prediabetes: Secondary | ICD-10-CM | POA: Diagnosis not present

## 2020-08-31 DIAGNOSIS — Z0000 Encounter for general adult medical examination without abnormal findings: Secondary | ICD-10-CM | POA: Diagnosis not present

## 2020-08-31 DIAGNOSIS — E7800 Pure hypercholesterolemia, unspecified: Secondary | ICD-10-CM | POA: Diagnosis not present

## 2020-08-31 DIAGNOSIS — I10 Essential (primary) hypertension: Secondary | ICD-10-CM | POA: Diagnosis not present

## 2020-08-31 DIAGNOSIS — Z125 Encounter for screening for malignant neoplasm of prostate: Secondary | ICD-10-CM | POA: Diagnosis not present

## 2020-08-31 DIAGNOSIS — C9110 Chronic lymphocytic leukemia of B-cell type not having achieved remission: Secondary | ICD-10-CM | POA: Diagnosis not present

## 2020-09-02 ENCOUNTER — Telehealth: Payer: Self-pay | Admitting: Gastroenterology

## 2020-09-02 NOTE — Telephone Encounter (Signed)
I spoke with him on the phone this morning concerning the "data breech" about which he received a letter from [facility] health.  We discussed the fact that this data breech and loss of his tissue in no way affects his medical care or the recommendations that I gave to him following his colonoscopy.  He was grateful for the call.

## 2020-09-05 DIAGNOSIS — L570 Actinic keratosis: Secondary | ICD-10-CM | POA: Diagnosis not present

## 2020-09-05 DIAGNOSIS — Z85828 Personal history of other malignant neoplasm of skin: Secondary | ICD-10-CM | POA: Diagnosis not present

## 2020-09-05 DIAGNOSIS — D485 Neoplasm of uncertain behavior of skin: Secondary | ICD-10-CM | POA: Diagnosis not present

## 2020-09-05 DIAGNOSIS — B078 Other viral warts: Secondary | ICD-10-CM | POA: Diagnosis not present

## 2020-09-05 DIAGNOSIS — Z85820 Personal history of malignant melanoma of skin: Secondary | ICD-10-CM | POA: Diagnosis not present

## 2020-09-19 ENCOUNTER — Other Ambulatory Visit (INDEPENDENT_AMBULATORY_CARE_PROVIDER_SITE_OTHER): Payer: 59

## 2020-09-19 DIAGNOSIS — R7989 Other specified abnormal findings of blood chemistry: Secondary | ICD-10-CM | POA: Diagnosis not present

## 2020-09-19 LAB — HEPATIC FUNCTION PANEL
ALT: 14 U/L (ref 0–53)
AST: 17 U/L (ref 0–37)
Albumin: 4.4 g/dL (ref 3.5–5.2)
Alkaline Phosphatase: 64 U/L (ref 39–117)
Bilirubin, Direct: 0.1 mg/dL (ref 0.0–0.3)
Total Bilirubin: 0.9 mg/dL (ref 0.2–1.2)
Total Protein: 7.3 g/dL (ref 6.0–8.3)

## 2020-09-21 ENCOUNTER — Other Ambulatory Visit: Payer: Self-pay

## 2020-09-21 DIAGNOSIS — R7989 Other specified abnormal findings of blood chemistry: Secondary | ICD-10-CM

## 2020-09-26 ENCOUNTER — Other Ambulatory Visit (HOSPITAL_COMMUNITY): Payer: Self-pay | Admitting: Physician Assistant

## 2020-09-26 MED FILL — MELOXICAM 15 MG TABLET: 15 | 90 days supply | Qty: 90 | Fill #0

## 2020-10-10 MED FILL — ROSUVASTATIN CALCIUM 10 MG: 10 | 90 days supply | Qty: 90 | Fill #1

## 2020-10-12 ENCOUNTER — Other Ambulatory Visit (HOSPITAL_COMMUNITY): Payer: Self-pay | Admitting: Physician Assistant

## 2020-10-12 MED FILL — ESOMEPRAZOLE MAG DR 40 MG C: 40 | 90 days supply | Qty: 90 | Fill #0

## 2020-11-01 ENCOUNTER — Other Ambulatory Visit (HOSPITAL_COMMUNITY): Payer: Self-pay

## 2020-11-01 ENCOUNTER — Telehealth: Payer: Self-pay

## 2020-11-01 MED FILL — Zoster Vac Recombinant Adjuvanted for IM Inj 50 MCG/0.5ML: INTRAMUSCULAR | 1 days supply | Qty: 1 | Fill #0 | Status: AC

## 2020-11-01 NOTE — Telephone Encounter (Signed)
Accident claim from completed today by myself and Benita Stabile today. Pt was informed it was ready for pickup. Form placed at the front desk.

## 2020-11-17 ENCOUNTER — Other Ambulatory Visit (HOSPITAL_COMMUNITY): Payer: Self-pay

## 2020-11-17 DIAGNOSIS — G510 Bell's palsy: Secondary | ICD-10-CM | POA: Diagnosis not present

## 2020-11-17 MED ORDER — PREDNISONE 20 MG PO TABS
60.0000 mg | ORAL_TABLET | Freq: Every day | ORAL | 0 refills | Status: AC
  Filled 2020-11-17: qty 21, 7d supply, fill #0

## 2020-11-17 MED ORDER — VALACYCLOVIR HCL 1 G PO TABS
1000.0000 mg | ORAL_TABLET | Freq: Three times a day (TID) | ORAL | 0 refills | Status: AC
  Filled 2020-11-17: qty 21, 7d supply, fill #0

## 2020-11-20 MED FILL — Lisinopril & Hydrochlorothiazide Tab 20-25 MG: ORAL | 90 days supply | Qty: 90 | Fill #0 | Status: AC

## 2020-11-21 ENCOUNTER — Other Ambulatory Visit (HOSPITAL_COMMUNITY): Payer: Self-pay

## 2020-11-24 ENCOUNTER — Other Ambulatory Visit (HOSPITAL_COMMUNITY): Payer: Self-pay

## 2020-11-24 DIAGNOSIS — N529 Male erectile dysfunction, unspecified: Secondary | ICD-10-CM | POA: Diagnosis not present

## 2020-11-24 DIAGNOSIS — G510 Bell's palsy: Secondary | ICD-10-CM | POA: Diagnosis not present

## 2020-11-24 MED ORDER — TADALAFIL 10 MG PO TABS
ORAL_TABLET | ORAL | 0 refills | Status: DC
  Filled 2020-11-24: qty 6, 30d supply, fill #0
  Filled 2021-02-22: qty 3, 3d supply, fill #1
  Filled 2021-02-22: qty 3, 27d supply, fill #1

## 2020-11-26 DIAGNOSIS — N529 Male erectile dysfunction, unspecified: Secondary | ICD-10-CM | POA: Insufficient documentation

## 2020-11-26 DIAGNOSIS — G510 Bell's palsy: Secondary | ICD-10-CM | POA: Insufficient documentation

## 2020-12-07 DIAGNOSIS — G510 Bell's palsy: Secondary | ICD-10-CM | POA: Diagnosis not present

## 2020-12-07 DIAGNOSIS — R634 Abnormal weight loss: Secondary | ICD-10-CM | POA: Diagnosis not present

## 2020-12-07 DIAGNOSIS — K529 Noninfective gastroenteritis and colitis, unspecified: Secondary | ICD-10-CM | POA: Diagnosis not present

## 2020-12-11 NOTE — Telephone Encounter (Signed)
Beth, pls contact the patient and let him know I was out of the office on vacation last week when he sent the [employer]. I last saw him in office in Dec. 2021. His symptoms in his mychart msg below including nausea/ diarrhea (black in color) are new.  Pls schedule his for an office follow up to discuss any ongoing symptoms and evaluation. Labs recommended include CBC, BMP, Hepatic panel and lipase. THX.

## 2020-12-11 NOTE — Telephone Encounter (Signed)
Beth, in addition to the labs requested below, pls also include a GI pathogen panel if he is still having diarrhea. thx

## 2020-12-12 NOTE — Telephone Encounter (Signed)
Noted  

## 2020-12-14 ENCOUNTER — Other Ambulatory Visit (INDEPENDENT_AMBULATORY_CARE_PROVIDER_SITE_OTHER): Payer: 59

## 2020-12-14 DIAGNOSIS — R7989 Other specified abnormal findings of blood chemistry: Secondary | ICD-10-CM | POA: Diagnosis not present

## 2020-12-14 LAB — HEPATIC FUNCTION PANEL
ALT: 16 U/L (ref 0–53)
AST: 17 U/L (ref 0–37)
Albumin: 4.1 g/dL (ref 3.5–5.2)
Alkaline Phosphatase: 74 U/L (ref 39–117)
Bilirubin, Direct: 0.1 mg/dL (ref 0.0–0.3)
Total Bilirubin: 0.6 mg/dL (ref 0.2–1.2)
Total Protein: 6.5 g/dL (ref 6.0–8.3)

## 2020-12-22 ENCOUNTER — Other Ambulatory Visit: Payer: Self-pay

## 2020-12-22 DIAGNOSIS — R7989 Other specified abnormal findings of blood chemistry: Secondary | ICD-10-CM

## 2020-12-25 MED FILL — Meloxicam Tab 15 MG: ORAL | 90 days supply | Qty: 90 | Fill #0 | Status: AC

## 2020-12-26 ENCOUNTER — Other Ambulatory Visit (HOSPITAL_COMMUNITY): Payer: Self-pay

## 2021-01-02 ENCOUNTER — Other Ambulatory Visit (HOSPITAL_COMMUNITY): Payer: Self-pay

## 2021-01-02 MED FILL — Esomeprazole Magnesium Cap Delayed Release 40 MG (Base Eq): ORAL | 90 days supply | Qty: 90 | Fill #0 | Status: AC

## 2021-01-03 ENCOUNTER — Other Ambulatory Visit (HOSPITAL_COMMUNITY): Payer: Self-pay

## 2021-01-04 ENCOUNTER — Other Ambulatory Visit (HOSPITAL_COMMUNITY): Payer: Self-pay

## 2021-01-04 ENCOUNTER — Other Ambulatory Visit (HOSPITAL_COMMUNITY): Payer: Self-pay | Admitting: Physician Assistant

## 2021-01-10 ENCOUNTER — Other Ambulatory Visit (HOSPITAL_COMMUNITY): Payer: Self-pay

## 2021-01-12 ENCOUNTER — Other Ambulatory Visit (HOSPITAL_COMMUNITY): Payer: Self-pay

## 2021-01-16 MED FILL — Rosuvastatin Calcium Tab 5 MG: ORAL | 90 days supply | Qty: 90 | Fill #0 | Status: CN

## 2021-01-17 ENCOUNTER — Other Ambulatory Visit (HOSPITAL_COMMUNITY): Payer: Self-pay

## 2021-01-17 ENCOUNTER — Other Ambulatory Visit (HOSPITAL_COMMUNITY): Payer: Self-pay | Admitting: Physician Assistant

## 2021-01-17 MED ORDER — ROSUVASTATIN CALCIUM 10 MG PO TABS
10.0000 mg | ORAL_TABLET | Freq: Every day | ORAL | 1 refills | Status: DC
  Filled 2021-01-17: qty 90, 90d supply, fill #0
  Filled 2021-04-12: qty 90, 90d supply, fill #1

## 2021-01-31 DIAGNOSIS — L821 Other seborrheic keratosis: Secondary | ICD-10-CM | POA: Diagnosis not present

## 2021-01-31 DIAGNOSIS — D2262 Melanocytic nevi of left upper limb, including shoulder: Secondary | ICD-10-CM | POA: Diagnosis not present

## 2021-01-31 DIAGNOSIS — L918 Other hypertrophic disorders of the skin: Secondary | ICD-10-CM | POA: Diagnosis not present

## 2021-01-31 DIAGNOSIS — L570 Actinic keratosis: Secondary | ICD-10-CM | POA: Diagnosis not present

## 2021-01-31 DIAGNOSIS — D2261 Melanocytic nevi of right upper limb, including shoulder: Secondary | ICD-10-CM | POA: Diagnosis not present

## 2021-01-31 DIAGNOSIS — D225 Melanocytic nevi of trunk: Secondary | ICD-10-CM | POA: Diagnosis not present

## 2021-01-31 DIAGNOSIS — Z85828 Personal history of other malignant neoplasm of skin: Secondary | ICD-10-CM | POA: Diagnosis not present

## 2021-01-31 DIAGNOSIS — Z85820 Personal history of malignant melanoma of skin: Secondary | ICD-10-CM | POA: Diagnosis not present

## 2021-01-31 DIAGNOSIS — D2272 Melanocytic nevi of left lower limb, including hip: Secondary | ICD-10-CM | POA: Diagnosis not present

## 2021-02-10 ENCOUNTER — Telehealth: Payer: Self-pay

## 2021-02-10 ENCOUNTER — Other Ambulatory Visit: Payer: Self-pay

## 2021-02-10 ENCOUNTER — Inpatient Hospital Stay: Payer: 59 | Attending: Hematology & Oncology

## 2021-02-10 ENCOUNTER — Encounter: Payer: Self-pay | Admitting: Hematology & Oncology

## 2021-02-10 ENCOUNTER — Inpatient Hospital Stay: Payer: 59 | Admitting: Hematology & Oncology

## 2021-02-10 VITALS — BP 141/72 | HR 55 | Temp 98.0°F | Resp 20 | Wt 250.0 lb

## 2021-02-10 DIAGNOSIS — C9110 Chronic lymphocytic leukemia of B-cell type not having achieved remission: Secondary | ICD-10-CM | POA: Insufficient documentation

## 2021-02-10 DIAGNOSIS — Z79899 Other long term (current) drug therapy: Secondary | ICD-10-CM | POA: Insufficient documentation

## 2021-02-10 LAB — CBC WITH DIFFERENTIAL (CANCER CENTER ONLY)
Abs Immature Granulocytes: 0.03 10*3/uL (ref 0.00–0.07)
Basophils Absolute: 0.1 10*3/uL (ref 0.0–0.1)
Basophils Relative: 1 %
Eosinophils Absolute: 0.4 10*3/uL (ref 0.0–0.5)
Eosinophils Relative: 4 %
HCT: 44.4 % (ref 39.0–52.0)
Hemoglobin: 15.5 g/dL (ref 13.0–17.0)
Immature Granulocytes: 0 %
Lymphocytes Relative: 44 %
Lymphs Abs: 4.3 10*3/uL — ABNORMAL HIGH (ref 0.7–4.0)
MCH: 32.6 pg (ref 26.0–34.0)
MCHC: 34.9 g/dL (ref 30.0–36.0)
MCV: 93.3 fL (ref 80.0–100.0)
Monocytes Absolute: 0.6 10*3/uL (ref 0.1–1.0)
Monocytes Relative: 6 %
Neutro Abs: 4.5 10*3/uL (ref 1.7–7.7)
Neutrophils Relative %: 45 %
Platelet Count: 189 10*3/uL (ref 150–400)
RBC: 4.76 MIL/uL (ref 4.22–5.81)
RDW: 12.3 % (ref 11.5–15.5)
WBC Count: 9.9 10*3/uL (ref 4.0–10.5)
nRBC: 0 % (ref 0.0–0.2)

## 2021-02-10 LAB — CMP (CANCER CENTER ONLY)
ALT: 13 U/L (ref 0–44)
AST: 17 U/L (ref 15–41)
Albumin: 4.5 g/dL (ref 3.5–5.0)
Alkaline Phosphatase: 74 U/L (ref 38–126)
Anion gap: 9 (ref 5–15)
BUN: 22 mg/dL (ref 8–23)
CO2: 29 mmol/L (ref 22–32)
Calcium: 10.1 mg/dL (ref 8.9–10.3)
Chloride: 99 mmol/L (ref 98–111)
Creatinine: 1.1 mg/dL (ref 0.61–1.24)
GFR, Estimated: >60 mL/min (ref >60)
Glucose, Bld: 102 mg/dL — ABNORMAL HIGH (ref 70–99)
Potassium: 3.7 mmol/L (ref 3.5–5.1)
Sodium: 137 mmol/L (ref 135–145)
Total Bilirubin: 0.6 mg/dL (ref 0.3–1.2)
Total Protein: 7.1 g/dL (ref 6.5–8.1)

## 2021-02-10 NOTE — Progress Notes (Signed)
Hematology and Oncology Follow Up Visit  Charles [name] [id number] January 13, 1956 65 y.o. 02/10/2021   Principle Diagnosis:  Chronic lymphocytic leukemia (Trisomy 12) - stage C - remission  Current Therapy:   Observation   Interim History:  Charles [name] is here today for follow-up.,  We see him yearly.  He has had a problem last year with respect to biliary obstruction.  It was felt that this may have been a gallstone that got lodged and finally passed.  He feels well right now.  He is still working.  He works in Theatre manager at [employer].  Actually, I see him every now and then in the hospital.  He was give me good ideas as to how to get around the hospital more effectively.  His wife is doing okay.  She had a case of meningitis I think a couple years ago.  He has had no problems with COVID.  He stays hydrated.  He has had no problems with bowels or bladder.  He has had no bleeding.  He has had no swollen lymph nodes.  Currently, his performance status is ECOG 0.     Medications:  Allergies as of 02/10/2021   No Known Allergies      Medication List        Accurate as of February 10, 2021  1:24 PM. If you have any questions, ask your nurse or doctor.          STOP taking these medications    Shingrix injection Generic drug: Zoster Vaccine Adjuvanted Stopped by: Volanda Napoleon, MD   Suprep Bowel Prep Kit 17.5-3.13-1.6 GM/177ML Soln Generic drug: Na Sulfate-K Sulfate-Mg Sulf Stopped by: Volanda Napoleon, MD       TAKE these medications    aspirin 81 MG tablet Take 81 mg by mouth daily.   cetirizine 10 MG tablet Commonly known as: ZYRTEC Take 10 mg by mouth daily.   esomeprazole 40 MG capsule Commonly known as: NEXIUM TAKE 1 CAPSULE BY MOUTH DAILY   hydrOXYzine 25 MG tablet Commonly known as: ATARAX/VISTARIL TAKE 1/2 TO 1 TABLET BY MOUTH THREE TIMES DAILY AS NEEDED ITCHING   ketoconazole 2 % cream Commonly known as: NIZORAL SMARTSIG:1 Application Topical  1 to 2 Times Daily   lisinopril-hydrochlorothiazide 20-25 MG tablet Commonly known as: ZESTORETIC TAKE 1 TABLET BY MOUTH DAILY   meloxicam 15 MG tablet Commonly known as: MOBIC TAKE 1 TABLET BY MOUTH ONCE DAILY.   rosuvastatin 10 MG tablet Commonly known as: CRESTOR Take 1 tablet (10 mg total) by mouth at bedtime.   tadalafil 10 MG tablet Commonly known as: CIALIS Take 1 to 2 tablets (10-20 mg dose) by mouth daily as needed for Erectile Dysfunction.   Vitamin D3 50 MCG (2000 UT) Tabs Take 2,000 Units by mouth.        Allergies: No Known Allergies  Past Medical History, Surgical history, Social history, and Family History were reviewed and updated.  Review of Systems: Review of Systems  Constitutional: Negative.   HENT: Negative.    Eyes: Negative.   Cardiovascular: Negative.   Gastrointestinal: Negative.   Genitourinary: Negative.   Musculoskeletal: Negative.   Skin: Negative.   Neurological: Negative.   Endo/Heme/Allergies: Negative.   Psychiatric/Behavioral: Negative.      Physical Exam:  weight is 250 lb (113.4 kg). His oral temperature is 98 F (36.7 C). His blood pressure is 141/72 (abnormal) and his pulse is 55 (abnormal). His respiration is 20 and oxygen saturation is  96%.   Wt Readings from Last 3 Encounters:  02/10/21 250 lb (113.4 kg)  07/05/20 250 lb (113.4 kg)  06/23/20 250 lb 6 oz (113.6 kg)    Physical Exam Vitals reviewed.  HENT:     Head: Normocephalic and atraumatic.  Eyes:     Pupils: Pupils are equal, round, and reactive to light.  Cardiovascular:     Rate and Rhythm: Normal rate and regular rhythm.     Heart sounds: Normal heart sounds.  Pulmonary:     Effort: Pulmonary effort is normal.     Breath sounds: Normal breath sounds.  Abdominal:     General: Bowel sounds are normal.     Palpations: Abdomen is soft.  Musculoskeletal:        General: No tenderness or deformity. Normal range of motion.     Cervical back: Normal range  of motion.  Lymphadenopathy:     Cervical: No cervical adenopathy.  Skin:    General: Skin is warm and dry.     Findings: No erythema or rash.  Neurological:     Mental Status: He is alert and oriented to person, place, and time.  Psychiatric:        Behavior: Behavior normal.        Thought Content: Thought content normal.        Judgment: Judgment normal.      Lab Results  Component Value Date   WBC 9.9 02/10/2021   HGB 15.5 02/10/2021   HCT 44.4 02/10/2021   MCV 93.3 02/10/2021   PLT 189 02/10/2021   Lab Results  Component Value Date   FERRITIN 301.5 05/23/2020   Lab Results  Component Value Date   RETICCTPCT 1.8 01/31/2011   RBC 4.76 02/10/2021   RETICCTABS 79.6 01/31/2011   No results found for: Nils Pyle [facility] Lab Results  Component Value Date   IGGSERUM 1,038 05/23/2020   IGA 131 06/24/2015   IGMSERUM 53 12/27/2015   Lab Results  Component Value Date   TOTALPROTELP 6.4 04/23/2013   ALBUMINELP 60.9 04/23/2013   A1GS 6.7 (H) 04/23/2013   A2GS 11.5 04/23/2013   BETS 6.6 04/23/2013   BETA2SER 4.1 04/23/2013   GAMS 10.2 (L) 04/23/2013   MSPIKE Not Observed 12/27/2015   SPEI * 04/23/2013     Chemistry      Component Value Date/Time   NA 137 02/10/2021 1147   NA 140 08/05/2017 1115   NA 139 06/26/2016 1000   K 3.7 02/10/2021 1147   K 4.4 06/26/2016 1000   CL 99 02/10/2021 1147   CL 102 04/10/2012 0815   CO2 29 02/10/2021 1147   CO2 27 06/26/2016 1000   BUN 22 02/10/2021 1147   BUN 16 08/05/2017 1115   BUN 13.5 06/26/2016 1000   CREATININE 1.10 02/10/2021 1147   CREATININE 1.1 06/26/2016 1000      Component Value Date/Time   CALCIUM 10.1 02/10/2021 1147   CALCIUM 9.5 06/26/2016 1000   ALKPHOS 74 02/10/2021 1147   ALKPHOS 83 06/26/2016 1000   AST 17 02/10/2021 1147   AST 20 06/26/2016 1000   ALT 13 02/10/2021 1147   ALT 19 06/26/2016 1000   BILITOT 0.6 02/10/2021 1147   BILITOT 0.69 06/26/2016 1000       Impression and Plan: Charles [name] is a very pleasant 65 yo caucasian male with CLL.   He completed 6 cycles of chemotherapy with Rituxan/Treanda in February 2014. So far, he has done well and there has  been no evidence of recurrence.   His white cell count is normal.  However, I have noted that his lymphocytes are going up slowly.  We will have to watch this.  He continues to do well and is being followed closely by his dermatologist .  Apparently, he will need a wider biopsy of a lesion on his back.  We will plan to see him back in 6 months.    Volanda Napoleon, MD 7/22/20221:24 PM

## 2021-02-10 NOTE — Telephone Encounter (Signed)
Pt states that Dr. Niel Hummer told him one year for lab and f/u even though the LOS states 6 months.  Pt insistent on one year, appts made and printed    Charles [name]

## 2021-02-11 LAB — IGG, IGA, IGM
IgA: 117 mg/dL (ref 61–437)
IgG (Immunoglobin G), Serum: 738 mg/dL (ref 603–1613)
IgM (Immunoglobulin M), Srm: 70 mg/dL (ref 20–172)

## 2021-02-13 LAB — KAPPA/LAMBDA LIGHT CHAINS
Kappa free light chain: 21.8 mg/L — ABNORMAL HIGH (ref 3.3–19.4)
Kappa, lambda light chain ratio: 1.61 (ref 0.26–1.65)
Lambda free light chains: 13.5 mg/L (ref 5.7–26.3)

## 2021-02-13 LAB — PROTEIN ELECTROPHORESIS, SERUM, WITH REFLEX
A/G Ratio: 1.6 (ref 0.7–1.7)
Albumin ELP: 4.2 g/dL (ref 2.9–4.4)
Alpha-1-Globulin: 0.2 g/dL (ref 0.0–0.4)
Alpha-2-Globulin: 0.8 g/dL (ref 0.4–1.0)
Beta Globulin: 0.9 g/dL (ref 0.7–1.3)
Gamma Globulin: 0.7 g/dL (ref 0.4–1.8)
Globulin, Total: 2.6 g/dL (ref 2.2–3.9)
Total Protein ELP: 6.8 g/dL (ref 6.0–8.5)

## 2021-02-21 ENCOUNTER — Telehealth: Payer: Self-pay

## 2021-02-22 ENCOUNTER — Other Ambulatory Visit (HOSPITAL_COMMUNITY): Payer: Self-pay

## 2021-02-23 ENCOUNTER — Other Ambulatory Visit (HOSPITAL_COMMUNITY): Payer: Self-pay

## 2021-02-23 ENCOUNTER — Other Ambulatory Visit (HOSPITAL_COMMUNITY): Payer: Self-pay | Admitting: Physician Assistant

## 2021-02-24 ENCOUNTER — Other Ambulatory Visit (HOSPITAL_COMMUNITY): Payer: Self-pay | Admitting: Physician Assistant

## 2021-02-24 ENCOUNTER — Other Ambulatory Visit (HOSPITAL_COMMUNITY): Payer: Self-pay

## 2021-02-27 ENCOUNTER — Other Ambulatory Visit (HOSPITAL_COMMUNITY): Payer: Self-pay

## 2021-02-27 ENCOUNTER — Other Ambulatory Visit (HOSPITAL_COMMUNITY): Payer: Self-pay | Admitting: Physician Assistant

## 2021-02-28 ENCOUNTER — Other Ambulatory Visit (HOSPITAL_COMMUNITY): Payer: Self-pay

## 2021-02-28 MED ORDER — LISINOPRIL-HYDROCHLOROTHIAZIDE 20-25 MG PO TABS
1.0000 | ORAL_TABLET | Freq: Every day | ORAL | 1 refills | Status: DC
  Filled 2021-02-28: qty 90, 90d supply, fill #0
  Filled 2021-05-18: qty 90, 90d supply, fill #1

## 2021-03-21 ENCOUNTER — Other Ambulatory Visit (HOSPITAL_COMMUNITY): Payer: Self-pay

## 2021-03-21 MED FILL — Meloxicam Tab 15 MG: ORAL | 90 days supply | Qty: 90 | Fill #1 | Status: AC

## 2021-04-12 ENCOUNTER — Other Ambulatory Visit (HOSPITAL_COMMUNITY): Payer: Self-pay

## 2021-04-12 MED FILL — Esomeprazole Magnesium Cap Delayed Release 40 MG (Base Eq): ORAL | 90 days supply | Qty: 90 | Fill #1 | Status: AC

## 2021-05-18 ENCOUNTER — Other Ambulatory Visit (HOSPITAL_COMMUNITY): Payer: Self-pay

## 2021-06-09 DIAGNOSIS — R519 Headache, unspecified: Secondary | ICD-10-CM | POA: Diagnosis not present

## 2021-06-09 DIAGNOSIS — R6883 Chills (without fever): Secondary | ICD-10-CM | POA: Diagnosis not present

## 2021-06-09 DIAGNOSIS — R059 Cough, unspecified: Secondary | ICD-10-CM | POA: Diagnosis not present

## 2021-06-18 MED FILL — Meloxicam Tab 15 MG: ORAL | 90 days supply | Qty: 90 | Fill #2 | Status: AC

## 2021-06-19 ENCOUNTER — Other Ambulatory Visit (HOSPITAL_COMMUNITY): Payer: Self-pay

## 2021-07-02 MED FILL — Esomeprazole Magnesium Cap Delayed Release 40 MG (Base Eq): ORAL | 90 days supply | Qty: 90 | Fill #2 | Status: AC

## 2021-07-03 ENCOUNTER — Other Ambulatory Visit (HOSPITAL_COMMUNITY): Payer: Self-pay

## 2021-07-09 ENCOUNTER — Other Ambulatory Visit (HOSPITAL_COMMUNITY): Payer: Self-pay

## 2021-07-10 ENCOUNTER — Other Ambulatory Visit (HOSPITAL_COMMUNITY): Payer: Self-pay

## 2021-07-10 MED ORDER — ROSUVASTATIN CALCIUM 10 MG PO TABS
10.0000 mg | ORAL_TABLET | Freq: Every day | ORAL | 1 refills | Status: DC
  Filled 2021-07-10: qty 90, 90d supply, fill #0

## 2021-08-21 ENCOUNTER — Other Ambulatory Visit (HOSPITAL_COMMUNITY): Payer: Self-pay

## 2021-08-21 ENCOUNTER — Telehealth: Payer: Self-pay | Admitting: *Deleted

## 2021-08-21 ENCOUNTER — Other Ambulatory Visit: Payer: Self-pay

## 2021-08-21 ENCOUNTER — Encounter: Payer: Self-pay | Admitting: Hematology & Oncology

## 2021-08-21 ENCOUNTER — Inpatient Hospital Stay: Payer: 59 | Admitting: Hematology & Oncology

## 2021-08-21 ENCOUNTER — Inpatient Hospital Stay: Payer: 59 | Attending: Hematology & Oncology

## 2021-08-21 VITALS — BP 125/57 | HR 54 | Temp 98.4°F | Resp 18 | Wt 245.0 lb

## 2021-08-21 DIAGNOSIS — C9111 Chronic lymphocytic leukemia of B-cell type in remission: Secondary | ICD-10-CM | POA: Insufficient documentation

## 2021-08-21 DIAGNOSIS — C9110 Chronic lymphocytic leukemia of B-cell type not having achieved remission: Secondary | ICD-10-CM

## 2021-08-21 DIAGNOSIS — Z9221 Personal history of antineoplastic chemotherapy: Secondary | ICD-10-CM | POA: Insufficient documentation

## 2021-08-21 LAB — CBC WITH DIFFERENTIAL (CANCER CENTER ONLY)
Abs Immature Granulocytes: 0.05 10*3/uL (ref 0.00–0.07)
Basophils Absolute: 0.1 10*3/uL (ref 0.0–0.1)
Basophils Relative: 1 %
Eosinophils Absolute: 0.4 10*3/uL (ref 0.0–0.5)
Eosinophils Relative: 4 %
HCT: 46 % (ref 39.0–52.0)
Hemoglobin: 15.6 g/dL (ref 13.0–17.0)
Immature Granulocytes: 1 %
Lymphocytes Relative: 42 %
Lymphs Abs: 4.4 10*3/uL — ABNORMAL HIGH (ref 0.7–4.0)
MCH: 32 pg (ref 26.0–34.0)
MCHC: 33.9 g/dL (ref 30.0–36.0)
MCV: 94.5 fL (ref 80.0–100.0)
Monocytes Absolute: 0.7 10*3/uL (ref 0.1–1.0)
Monocytes Relative: 7 %
Neutro Abs: 5 10*3/uL (ref 1.7–7.7)
Neutrophils Relative %: 45 %
Platelet Count: 244 10*3/uL (ref 150–400)
RBC: 4.87 MIL/uL (ref 4.22–5.81)
RDW: 12.5 % (ref 11.5–15.5)
WBC Count: 10.6 10*3/uL — ABNORMAL HIGH (ref 4.0–10.5)
nRBC: 0 % (ref 0.0–0.2)

## 2021-08-21 LAB — CMP (CANCER CENTER ONLY)
ALT: 12 U/L (ref 0–44)
AST: 16 U/L (ref 15–41)
Albumin: 4.5 g/dL (ref 3.5–5.0)
Alkaline Phosphatase: 79 U/L (ref 38–126)
Anion gap: 8 (ref 5–15)
BUN: 19 mg/dL (ref 8–23)
CO2: 31 mmol/L (ref 22–32)
Calcium: 10 mg/dL (ref 8.9–10.3)
Chloride: 99 mmol/L (ref 98–111)
Creatinine: 1.18 mg/dL (ref 0.61–1.24)
GFR, Estimated: >60 mL/min (ref >60)
Glucose, Bld: 118 mg/dL — ABNORMAL HIGH (ref 70–99)
Potassium: 4.4 mmol/L (ref 3.5–5.1)
Sodium: 138 mmol/L (ref 135–145)
Total Bilirubin: 0.6 mg/dL (ref 0.3–1.2)
Total Protein: 6.8 g/dL (ref 6.5–8.1)

## 2021-08-21 LAB — SAVE SMEAR(SSMR), FOR PROVIDER SLIDE REVIEW

## 2021-08-21 LAB — LACTATE DEHYDROGENASE: LDH: 156 U/L (ref 98–192)

## 2021-08-21 NOTE — Telephone Encounter (Signed)
Per 08/21/21 los - gave upcoming appointments - confirmed

## 2021-08-21 NOTE — Progress Notes (Signed)
Hematology and Oncology Follow Up Visit  Charles [name] [id number] 07/26/1955 66 y.o. 08/21/2021   Principle Diagnosis:  Chronic lymphocytic leukemia (Trisomy 12) - stage C - remission  Current Therapy:   Observation   Interim History:  Charles [name] is here today for follow-up.  As always, he is working at [employer].  He is in maintenance.  He has been quite busy.  I think he will be involved with the [facility].  He has had no problem with infections since we last saw him.  He has had no issues with nausea or vomiting.  He has had no fever.  He has had no change in bowel or bladder habits.  He has not noted any swollen lymph nodes.  Currently, I would have to say that his performance status is ECOG 1.     Medications:  Allergies as of 08/21/2021   No Known Allergies      Medication List        Accurate as of August 21, 2021 11:27 AM. If you have any questions, ask your nurse or doctor.          Ascorbic Acid 500 MG Chew Chew 500 mg by mouth daily.   aspirin 81 MG tablet Take 81 mg by mouth daily.   cetirizine 10 MG tablet Commonly known as: ZYRTEC Take 10 mg by mouth daily.   esomeprazole 40 MG capsule Commonly known as: NEXIUM TAKE 1 CAPSULE BY MOUTH DAILY   ibuprofen 800 MG tablet Commonly known as: ADVIL Take by mouth.   ketoconazole 2 % cream Commonly known as: NIZORAL SMARTSIG:1 Application Topical 1 to 2 Times Daily   lisinopril-hydrochlorothiazide 20-25 MG tablet Commonly known as: ZESTORETIC TAKE 1 TABLET BY MOUTH DAILY   lisinopril-hydrochlorothiazide 20-25 MG tablet Commonly known as: ZESTORETIC Take one tablet by mouth daily.   meloxicam 15 MG tablet Commonly known as: MOBIC TAKE 1 TABLET BY MOUTH ONCE DAILY.   rosuvastatin 10 MG tablet Commonly known as: CRESTOR Take 1 tablet (10 mg total) by mouth at bedtime.   tadalafil 10 MG tablet Commonly known as: CIALIS Take 1 to 2 tablets (10-20 mg dose) by mouth  daily as needed for Erectile Dysfunction.   Vitamin D3 50 MCG (2000 UT) Tabs Take 2,000 Units by mouth.   Cholecalciferol 50 MCG (2000 UT) Caps        Allergies: No Known Allergies  Past Medical History, Surgical history, Social history, and Family History were reviewed and updated.  Review of Systems: Review of Systems  Constitutional: Negative.   HENT: Negative.    Eyes: Negative.   Cardiovascular: Negative.   Gastrointestinal: Negative.   Genitourinary: Negative.   Musculoskeletal: Negative.   Skin: Negative.   Neurological: Negative.   Endo/Heme/Allergies: Negative.   Psychiatric/Behavioral: Negative.      Physical Exam:  weight is 245 lb (111.1 kg). His oral temperature is 98.4 F (36.9 C). His blood pressure is 125/57 (abnormal) and his pulse is 54 (abnormal). His respiration is 18 and oxygen saturation is 97%.   Wt Readings from Last 3 Encounters:  08/21/21 245 lb (111.1 kg)  02/10/21 250 lb (113.4 kg)  07/05/20 250 lb (113.4 kg)    Physical Exam Vitals reviewed.  HENT:     Head: Normocephalic and atraumatic.  Eyes:     Pupils: Pupils are equal, round, and reactive to light.  Cardiovascular:     Rate and Rhythm: Normal rate and regular rhythm.  Heart sounds: Normal heart sounds.  Pulmonary:     Effort: Pulmonary effort is normal.     Breath sounds: Normal breath sounds.  Abdominal:     General: Bowel sounds are normal.     Palpations: Abdomen is soft.  Musculoskeletal:        General: No tenderness or deformity. Normal range of motion.     Cervical back: Normal range of motion.  Lymphadenopathy:     Cervical: No cervical adenopathy.  Skin:    General: Skin is warm and dry.     Findings: No erythema or rash.  Neurological:     Mental Status: He is alert and oriented to person, place, and time.  Psychiatric:        Behavior: Behavior normal.        Thought Content: Thought content normal.        Judgment: Judgment normal.      Lab  Results  Component Value Date   WBC 10.6 (H) 08/21/2021   HGB 15.6 08/21/2021   HCT 46.0 08/21/2021   MCV 94.5 08/21/2021   PLT 244 08/21/2021   Lab Results  Component Value Date   FERRITIN 301.5 05/23/2020   Lab Results  Component Value Date   RETICCTPCT 1.8 01/31/2011   RBC 4.87 08/21/2021   RETICCTABS 79.6 01/31/2011   Lab Results  Component Value Date   KPAFRELGTCHN 21.8 (H) 02/10/2021   LAMBDASER 13.5 02/10/2021   KAPLAMBRATIO 1.61 02/10/2021   Lab Results  Component Value Date   IGGSERUM 738 02/10/2021   IGA 117 02/10/2021   IGMSERUM 70 02/10/2021   Lab Results  Component Value Date   TOTALPROTELP 6.8 02/10/2021   ALBUMINELP 4.2 02/10/2021   A1GS 0.2 02/10/2021   A2GS 0.8 02/10/2021   BETS 0.9 02/10/2021   BETA2SER 4.1 04/23/2013   GAMS 0.7 02/10/2021   MSPIKE Not Observed 02/10/2021   SPEI * 04/23/2013     Chemistry      Component Value Date/Time   NA 138 08/21/2021 1002   NA 140 08/05/2017 1115   NA 139 06/26/2016 1000   K 4.4 08/21/2021 1002   K 4.4 06/26/2016 1000   CL 99 08/21/2021 1002   CL 102 04/10/2012 0815   CO2 31 08/21/2021 1002   CO2 27 06/26/2016 1000   BUN 19 08/21/2021 1002   BUN 16 08/05/2017 1115   BUN 13.5 06/26/2016 1000   CREATININE 1.18 08/21/2021 1002   CREATININE 1.1 06/26/2016 1000      Component Value Date/Time   CALCIUM 10.0 08/21/2021 1002   CALCIUM 9.5 06/26/2016 1000   ALKPHOS 79 08/21/2021 1002   ALKPHOS 83 06/26/2016 1000   AST 16 08/21/2021 1002   AST 20 06/26/2016 1000   ALT 12 08/21/2021 1002   ALT 19 06/26/2016 1000   BILITOT 0.6 08/21/2021 1002   BILITOT 0.69 06/26/2016 1000      Impression and Plan: Charles [name] is a very pleasant 66 yo caucasian male with CLL.   He completed 6 cycles of chemotherapy with Rituxan/Treanda in February 2014. So far, he has done well and there has been no evidence of recurrence.   His white cell count is normal.  Unfortunately, we still do not have the lymphocyte  percentage back.  I cannot anything on exam that looks suspicious.  I will plan to see him back in 1 year.  I oftentimes see him in the hospital working.    Volanda Napoleon, MD 1/30/202311:27 AM

## 2021-08-22 ENCOUNTER — Other Ambulatory Visit (HOSPITAL_COMMUNITY): Payer: Self-pay

## 2021-08-22 LAB — IGG, IGA, IGM
IgA: 148 mg/dL (ref 61–437)
IgG (Immunoglobin G), Serum: 713 mg/dL (ref 603–1613)
IgM (Immunoglobulin M), Srm: 75 mg/dL (ref 20–172)

## 2021-08-22 MED ORDER — LISINOPRIL-HYDROCHLOROTHIAZIDE 20-25 MG PO TABS
1.0000 | ORAL_TABLET | Freq: Every day | ORAL | 1 refills | Status: DC
Start: 2021-08-21 — End: 2022-02-14
  Filled 2021-08-22: qty 90, 90d supply, fill #0
  Filled 2021-11-17: qty 90, 90d supply, fill #1

## 2021-09-12 DIAGNOSIS — Z2820 Immunization not carried out because of patient decision for unspecified reason: Secondary | ICD-10-CM | POA: Diagnosis not present

## 2021-09-12 DIAGNOSIS — I10 Essential (primary) hypertension: Secondary | ICD-10-CM | POA: Diagnosis not present

## 2021-09-12 DIAGNOSIS — E7800 Pure hypercholesterolemia, unspecified: Secondary | ICD-10-CM | POA: Diagnosis not present

## 2021-09-12 DIAGNOSIS — C9110 Chronic lymphocytic leukemia of B-cell type not having achieved remission: Secondary | ICD-10-CM | POA: Diagnosis not present

## 2021-09-12 DIAGNOSIS — R7303 Prediabetes: Secondary | ICD-10-CM | POA: Diagnosis not present

## 2021-09-12 DIAGNOSIS — Z0000 Encounter for general adult medical examination without abnormal findings: Secondary | ICD-10-CM | POA: Diagnosis not present

## 2021-09-19 IMAGING — MR MR ABDOMEN WO/W CM MRCP
13 of 21 series · 23 of 48 positions shown · IV contrast (gadavist)
Comparison: CT abdomen pelvis, 10/08/2012

CLINICAL DATA: Acute transaminitis, cholestasis and jaundice, right
upper quadrant ultrasound with hepatomegaly and cirrhotic morphology

EXAM:
MRI ABDOMEN WITHOUT AND WITH CONTRAST (INCLUDING MRCP)
TECHNIQUE: Multiplanar multisequence MR imaging of the abdomen was performed
both before and after the administration of intravenous contrast.
Heavily T2-weighted images of the biliary and pancreatic ducts were
obtained, and three-dimensional MRCP images were rendered by post
processing.
CONTRAST:  10mL GADAVIST GADOBUTROL 1 MMOL/ML IV SOLN

[Series 3: cor ssfse nav · coronal · 6.0mm · 0.78mm/px · 2 of 48 slices shown]
[im 1/48]
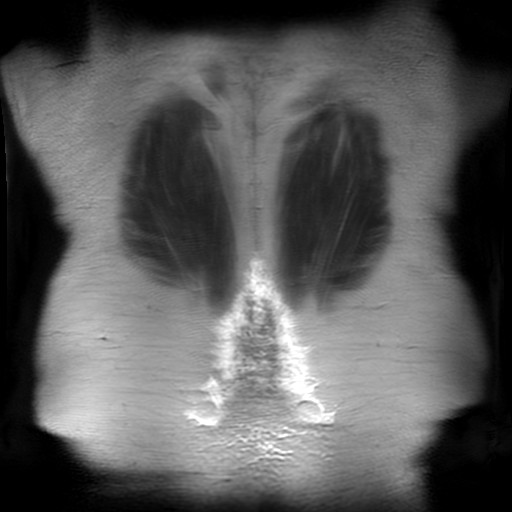
[im 48/48]
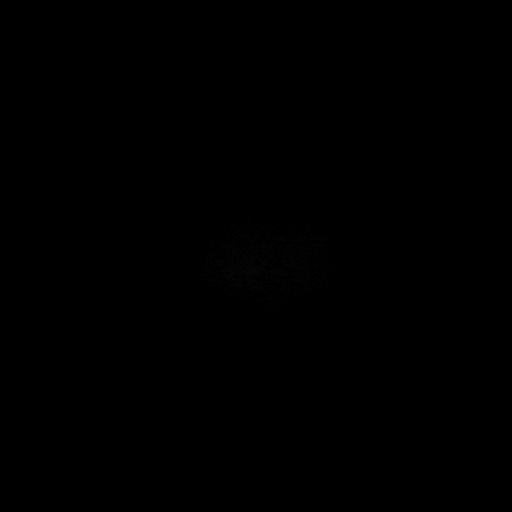

[Series 4: ax ssfse nav · axial · 6.0mm · 0.82mm/px · 1 of 47 slices shown]
[im 1/47]
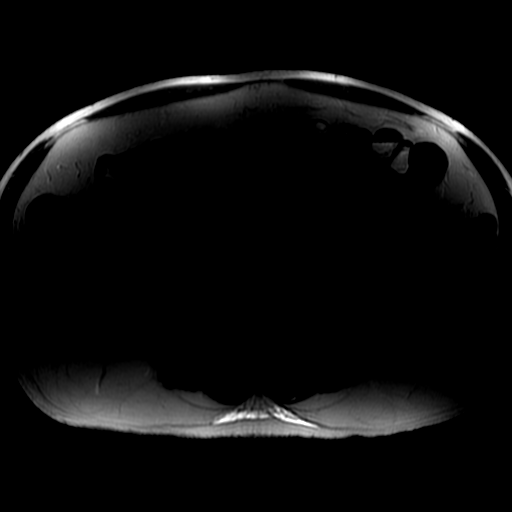

[Series 5: T2 fat-sat · axial · 6.0mm · 0.82mm/px · 1 of 43 slices shown]
[im 1/43]
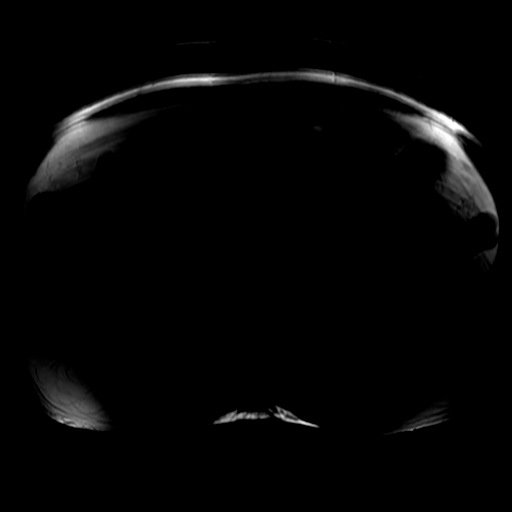

[Series 6: DWI b500 · axial · 8.0mm · 1.64mm/px · 1 of 56 slices shown]
[im 1/56]
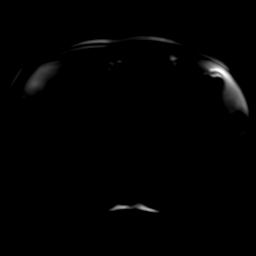

[Series 7: T1 dynamic · axial · 5.0mm · 0.82mm/px · z∈[-107,+181]mm · 2 of 116 slices shown (1 of 4)]
[im 1/116]
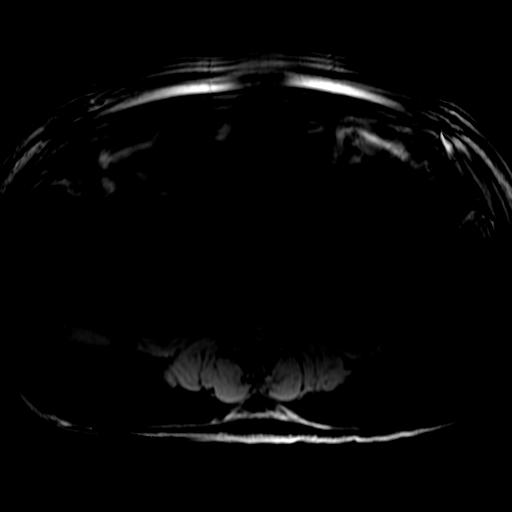
[im 116/116]
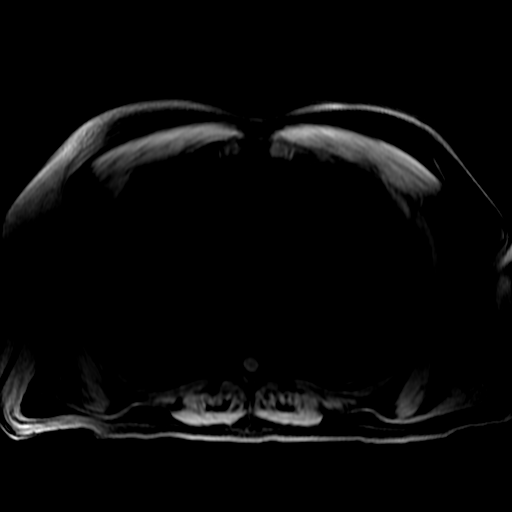

[Series 9: radial 2d thick · coronal · 40.0mm · 0.86mm/px · 1 of 6 slices shown]
[im 1/6]
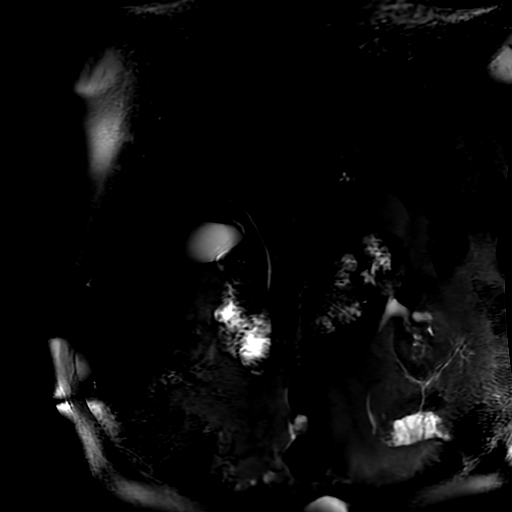

[Series 10: bSSFP · coronal · 6.0mm · 0.78mm/px · 1 of 48 slices shown]
[im 1/48]
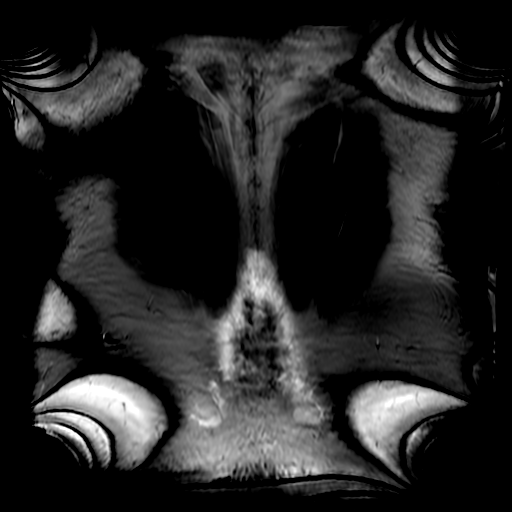

[Series 12: T1 dynamic · coronal · 3.4mm · 1.64mm/px · 4 of 172 slices shown (2 of 4)]
[im 1/172]
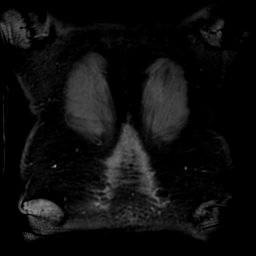
[im 58/172]
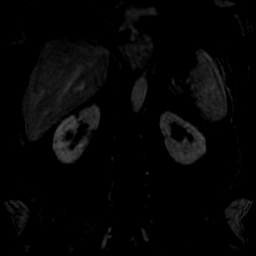
[im 115/172]
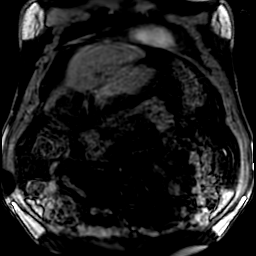
[im 172/172]
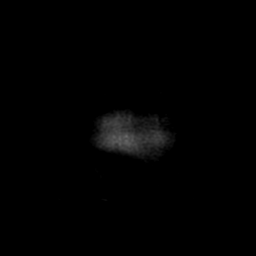

[Series 650: ADC · axial · 8.0mm · 1.64mm/px · 1 of 28 slices shown]
[im 1/28]
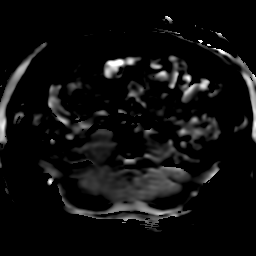

[Series 701: T1 dynamic · axial · 5.0mm · 0.82mm/px · z∈[-107,+181]mm · 2 of 116 slices shown (3 of 4)]
[im 1/116]
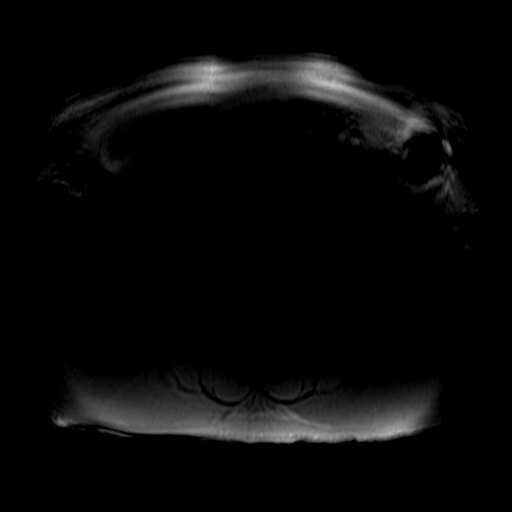
[im 116/116]
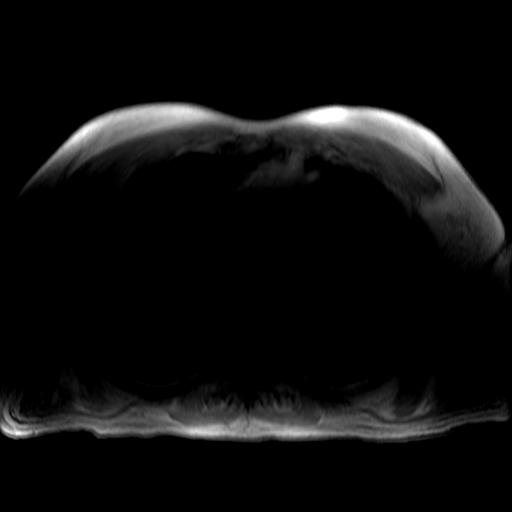

[Series 702: T1 dynamic · axial · 5.0mm · 0.82mm/px · z∈[-107,+181]mm · 2 of 116 slices shown (4 of 4)]
[im 1/116]
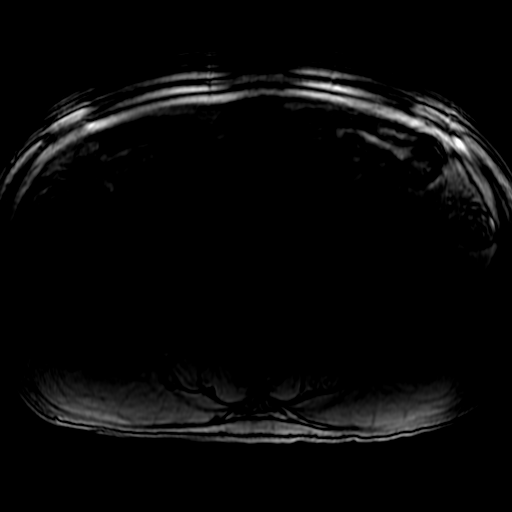
[im 116/116]
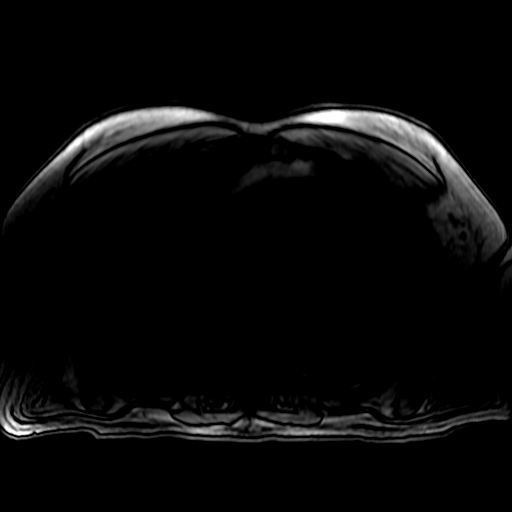

[Series 1100: T1 dynamic post-contrast · axial · non-contrast · 4.0mm · 0.86mm/px · z∈[-110,+184]mm · 3 of 148 slices shown (1 of 2)]
[im 1/148]
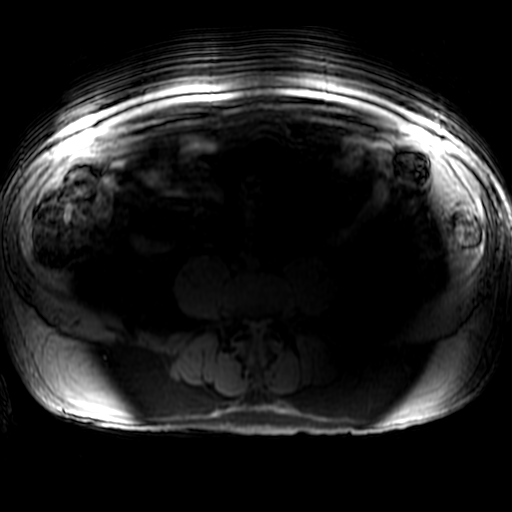
[im 74/148]
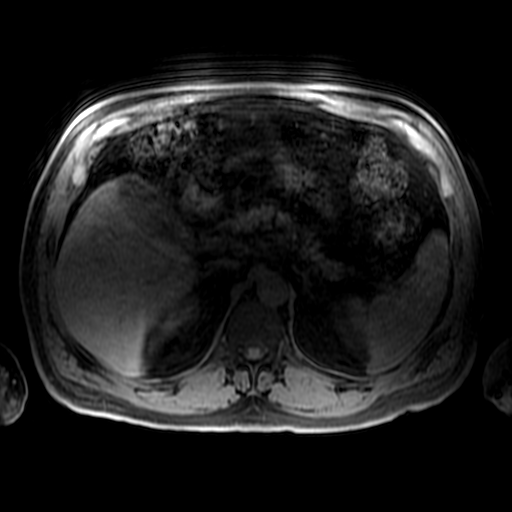
[im 148/148]
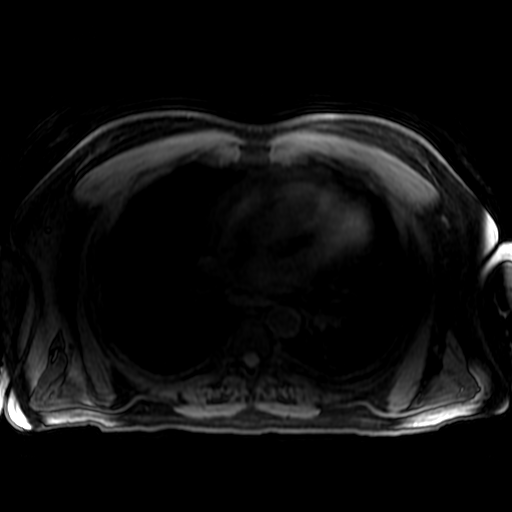

[Series 1101: T1 dynamic post-contrast · axial · non-contrast · 4.0mm · 0.86mm/px · z∈[-110,+36]mm · 2 of 148 slices shown (2 of 2)]
[im 1/148]
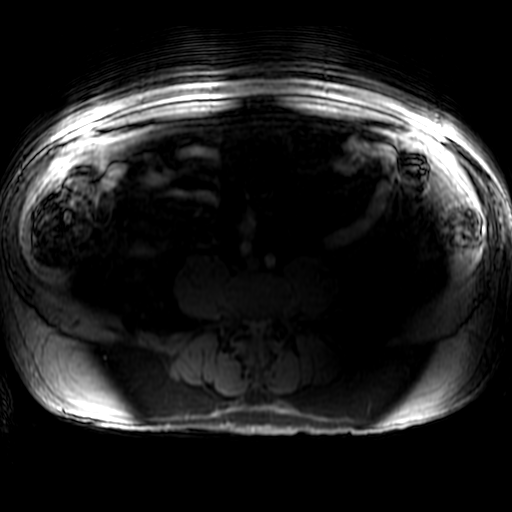
[im 74/148]
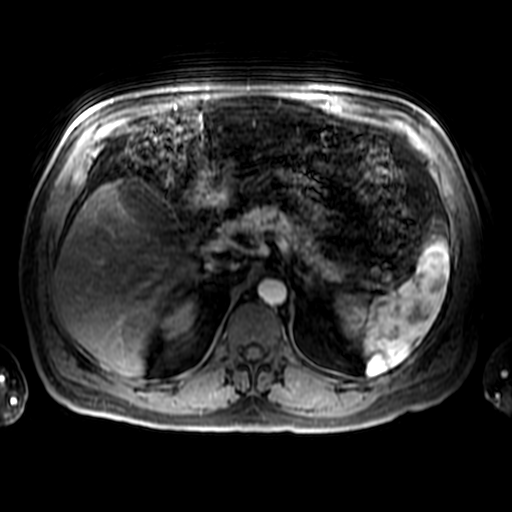

[23 of 48 positions shown; findings below may reference images not displayed]

FINDINGS: Examination is generally limited by breath motion artifact
throughout.

Lower chest: No acute findings. Moderate hiatal hernia with
intrathoracic position of the gastric fundus.

Hepatobiliary: Mild hepatic steatosis and mild hepatomegaly, maximum
coronal span 21.0 cm. The left lobe of the liver is shrunken and
there is a prominent caudate lobe. No mass or other parenchymal
abnormality identified. No biliary ductal dilatation.

Pancreas: No mass, inflammatory changes, or other parenchymal
abnormality identified.

Spleen:  Mild splenomegaly, maximum coronal span 13.9 cm.

Adrenals/Urinary Tract: No masses identified. No evidence of
hydronephrosis.

Stomach/Bowel: Visualized portions within the abdomen are
unremarkable.

Vascular/Lymphatic: No pathologically enlarged lymph nodes
identified. No abdominal aortic aneurysm demonstrated. The portal
and hepatic veins are patent.

Other:  None.

Musculoskeletal: No suspicious bone lesions identified.
IMPRESSION: 1. Examination is generally limited by breath motion artifact
throughout.
2. Mild hepatic steatosis and hepatomegaly. The left lobe of the
liver is shrunken and there is a prominent caudate lobe. Morphology
suggests cirrhosis without overtly nodular contour or fibrotic
enhancement pattern.
3. No mass or suspicious contrast enhancement.
4. No biliary ductal dilatation per report of cholestasis.
5. Mild splenomegaly.
6. Moderate hiatal hernia with intrathoracic position of the gastric
fundus.

## 2021-09-22 ENCOUNTER — Other Ambulatory Visit (HOSPITAL_COMMUNITY): Payer: Self-pay

## 2021-09-25 ENCOUNTER — Other Ambulatory Visit (HOSPITAL_COMMUNITY): Payer: Self-pay

## 2021-09-26 ENCOUNTER — Other Ambulatory Visit (HOSPITAL_COMMUNITY): Payer: Self-pay

## 2021-09-28 ENCOUNTER — Other Ambulatory Visit (HOSPITAL_COMMUNITY): Payer: Self-pay

## 2021-09-29 ENCOUNTER — Other Ambulatory Visit (HOSPITAL_COMMUNITY): Payer: Self-pay

## 2021-10-02 ENCOUNTER — Other Ambulatory Visit (HOSPITAL_COMMUNITY): Payer: Self-pay

## 2021-10-02 MED ORDER — ESOMEPRAZOLE MAGNESIUM 40 MG PO CPDR
40.0000 mg | DELAYED_RELEASE_CAPSULE | Freq: Every day | ORAL | 3 refills | Status: DC
  Filled 2021-10-02: qty 90, 90d supply, fill #0
  Filled 2022-01-02: qty 90, 90d supply, fill #1
  Filled 2022-03-28: qty 90, 90d supply, fill #2
  Filled 2022-06-26: qty 90, 90d supply, fill #3

## 2021-10-02 MED ORDER — ROSUVASTATIN CALCIUM 10 MG PO TABS
10.0000 mg | ORAL_TABLET | Freq: Every day | ORAL | 1 refills | Status: DC
  Filled 2021-10-02: qty 90, 90d supply, fill #0
  Filled 2022-01-02: qty 90, 90d supply, fill #1

## 2021-10-02 MED ORDER — MELOXICAM 15 MG PO TABS
15.0000 mg | ORAL_TABLET | Freq: Every day | ORAL | 0 refills | Status: DC
  Filled 2021-10-02: qty 90, 90d supply, fill #0

## 2021-11-17 ENCOUNTER — Other Ambulatory Visit (HOSPITAL_COMMUNITY): Payer: Self-pay

## 2021-11-19 ENCOUNTER — Encounter: Payer: Self-pay | Admitting: Emergency Medicine

## 2021-11-19 ENCOUNTER — Ambulatory Visit
Admission: EM | Admit: 2021-11-19 | Discharge: 2021-11-19 | Disposition: A | Discharge status: Home / Self Care | Payer: 59 | Attending: Urgent Care | Admitting: Urgent Care

## 2021-11-19 DIAGNOSIS — J309 Allergic rhinitis, unspecified: Secondary | ICD-10-CM | POA: Diagnosis not present

## 2021-11-19 DIAGNOSIS — J019 Acute sinusitis, unspecified: Secondary | ICD-10-CM | POA: Diagnosis not present

## 2021-11-19 MED ORDER — PREDNISONE 10 MG PO TABS: 30.0000 mg | ORAL_TABLET | Freq: Every day | ORAL | 0 refills | Status: DC

## 2021-11-19 MED ORDER — AMOXICILLIN-POT CLAVULANATE 875-125 MG PO TABS: 1.0000 | ORAL_TABLET | Freq: Two times a day (BID) | ORAL | 0 refills | Status: DC

## 2021-11-19 NOTE — ED Triage Notes (Signed)
Patient c/o nasal drainage, cough, chest soreness from cough x 10 days.  Patient denies any OTC cold meds. ?

## 2021-11-19 NOTE — ED Provider Notes (Signed)
?Charles [name] ? ? ?MRN: [id number] DOB: 04-Nov-1955 ? ?Subjective:  ? ?Charles [name] is a 66 y.o. male presenting for 10-day history of acute onset persistent worsening sinus congestion, sinus pressure, postnasal drainage, coughing.  Has felt some soreness from the cough.  He like to make sure he does not have pneumonia.  Has a history of allergic rhinitis and takes Zyrtec every day.  Patient is not a smoker, no history of respiratory disorders.  No history of CKD.  He does have a difficult time every spring from his allergic rhinitis. ? ?No current facility-administered medications for this encounter. ? ?Current Outpatient Medications:  ?  Ascorbic Acid 500 MG CHEW, Chew 500 mg by mouth daily., Disp: , Rfl:  ?  aspirin 81 MG tablet, Take 81 mg by mouth daily., Disp: , Rfl:  ?  cetirizine (ZYRTEC) 10 MG tablet, Take 10 mg by mouth daily., Disp: , Rfl:  ?  Cholecalciferol (VITAMIN D3) 2000 UNITS TABS, Take 2,000 Units by mouth., Disp: , Rfl:  ?  Cholecalciferol 50 MCG (2000 UT) CAPS, , Disp: , Rfl:  ?  esomeprazole (NEXIUM) 40 MG capsule, Take 1 capsule (40 mg total) by mouth daily., Disp: 90 capsule, Rfl: 3 ?  ibuprofen (ADVIL) 800 MG tablet, Take by mouth., Disp: , Rfl:  ?  ketoconazole (NIZORAL) 2 % cream, SMARTSIG:1 Application Topical 1 to 2 Times Daily, Disp: , Rfl:  ?  lisinopril-hydrochlorothiazide (ZESTORETIC) 20-25 MG tablet, Take one tablet by mouth daily., Disp: 90 tablet, Rfl: 1 ?  lisinopril-hydrochlorothiazide (ZESTORETIC) 20-25 MG tablet, Take one tablet by mouth daily., Disp: 90 tablet, Rfl: 1 ?  meloxicam (MOBIC) 15 MG tablet, Take 1 tablet (15 mg total) by mouth daily., Disp: 90 tablet, Rfl: 0 ?  rosuvastatin (CRESTOR) 10 MG tablet, Take 1 tablet (10 mg total) by mouth at bedtime., Disp: 90 tablet, Rfl: 1 ?  rosuvastatin (CRESTOR) 10 MG tablet, Take 1 tablet (10 mg total) by mouth daily., Disp: 90 tablet, Rfl: 1 ?  tadalafil (CIALIS) 10 MG tablet, Take 1 to 2 tablets (10-20 mg dose)  by mouth daily as needed for Erectile Dysfunction., Disp: 30 tablet, Rfl: 0 ?  esomeprazole (NEXIUM) 40 MG capsule, TAKE 1 CAPSULE BY MOUTH DAILY, Disp: 90 capsule, Rfl: 3 ?  lisinopril-hydrochlorothiazide (ZESTORETIC) 20-25 MG tablet, TAKE 1 TABLET BY MOUTH DAILY, Disp: 90 tablet, Rfl: 1  ? ?No Known Allergies ? ?Past Medical History:  ?Diagnosis Date  ? Allergy   ? Arthritis   ? CLL (chronic lymphoblastic leukemia) 08/03/2011  ? CLL (chronic lymphocytic leukemia) ([place]) 03/18/2012  ? GERD (gastroesophageal reflux disease)   ? Hyperlipidemia   ? Hypertension   ? Shingles   ? Ulcer   ?  ? ?Past Surgical History:  ?Procedure Laterality Date  ? HERNIA REPAIR    ? KNEE ARTHROSCOPY WITH LATERAL MENISECTOMY Left 12/24/2019  ? Procedure: KNEE ARTHROSCOPY WITH PARTIAL LATERAL MENISECTOMY;  Surgeon: Mcarthur Rossetti, MD;  Location: [facility];  Service: Orthopedics;  Laterality: Left;  ? KNEE SURGERY    ? SHOULDER SURGERY    ? WRIST SURGERY    ? ? ?Family History  ?Problem Relation Age of Onset  ? Cancer Brother 42  ?     prostate cancer stage 4  ? Hyperlipidemia Father   ? Cancer Brother 30  ?     prostate  ? Cancer Sister   ? Breast cancer Sister   ? Diabetes Mother   ? Melanoma  Sister   ? Cancer Brother   ? Cancer - Colon Brother   ? Colon cancer Brother   ? ? ?Social History  ? ?Tobacco Use  ? Smoking status: Former  ?  Packs/day: 2.00  ?  Years: 20.00  ?  Pack years: 40.00  ?  Types: Cigarettes  ?  Start date: 08/24/1978  ?  Quit date: 08/24/1998  ?  Years since quitting: 23.2  ? Smokeless tobacco: Never  ? Tobacco comments:  ?  quit smoking 15 years ago  ?Vaping Use  ? Vaping Use: Never used  ?Substance Use Topics  ? Alcohol use: Yes  ?  Alcohol/week: 0.0 standard drinks  ?  Comment: 1 a week  ? Drug use: No  ? ? ?ROS ? ? ?Objective:  ? ?Vitals: ?BP 121/72 (BP Location: Left Arm)   Pulse 62   Temp 98.6 ?F (37 ?C) (Oral)   Resp 20   Ht 5' 8.5" (1.74 m)   Wt 235 lb (106.6 kg)   SpO2 95%   BMI 35.21  kg/m?  ? ?Physical Exam ?Constitutional:   ?   General: He is not in acute distress. ?   Appearance: Normal appearance. He is well-developed and normal weight. He is not ill-appearing, toxic-appearing or diaphoretic.  ?HENT:  ?   Head: Normocephalic and atraumatic.  ?   Right Ear: Tympanic membrane, ear canal and external ear normal. There is no impacted cerumen.  ?   Left Ear: Tympanic membrane, ear canal and external ear normal. There is no impacted cerumen.  ?   Nose: Congestion and rhinorrhea present.  ?   Mouth/Throat:  ?   Mouth: Mucous membranes are moist.  ?   Pharynx: No pharyngeal swelling, oropharyngeal exudate, posterior oropharyngeal erythema or uvula swelling.  ?   Tonsils: No tonsillar exudate or tonsillar abscesses. 0 on the right. 0 on the left.  ?Eyes:  ?   General: No scleral icterus.    ?   Right eye: No discharge.     ?   Left eye: No discharge.  ?   Extraocular Movements: Extraocular movements intact.  ?   Conjunctiva/sclera: Conjunctivae normal.  ?Cardiovascular:  ?   Rate and Rhythm: Normal rate and regular rhythm.  ?   Heart sounds: Normal heart sounds. No murmur heard. ?  No friction rub. No gallop.  ?Pulmonary:  ?   Effort: Pulmonary effort is normal. No respiratory distress.  ?   Breath sounds: Normal breath sounds. No stridor. No wheezing, rhonchi or rales.  ?Musculoskeletal:  ?   Cervical back: Normal range of motion and neck supple. No rigidity. No muscular tenderness.  ?Neurological:  ?   General: No focal deficit present.  ?   Mental Status: He is alert and oriented to person, place, and time.  ?Psychiatric:     ?   Mood and Affect: Mood normal.     ?   Behavior: Behavior normal.     ?   Thought Content: Thought content normal.     ?   Judgment: Judgment normal.  ? ? ?Assessment and Plan :  ? ?PDMP not reviewed this encounter. ? ?1. Acute non-recurrent sinusitis, unspecified location   ?2. Allergic rhinitis, unspecified seasonality, unspecified trigger   ? ?Deferred imaging given  clear cardiopulmonary exam, hemodynamically stable vital signs. Will start empiric treatment for sinusitis with Augmentin.  Given patient's difficulty with allergic rhinitis and the fact that he is compliant with his medications, offered an  oral prednisone course to help going forward.  Otherwise maintain allergy medications.  Counseled patient on potential for adverse effects with medications prescribed/recommended today, ER and return-to-clinic precautions discussed, patient verbalized understanding. ? ?  ?Jaynee Eagles, PA-C ?11/19/21 1110 ? ?

## 2021-12-11 ENCOUNTER — Other Ambulatory Visit (HOSPITAL_COMMUNITY): Payer: Self-pay

## 2021-12-11 DIAGNOSIS — R7303 Prediabetes: Secondary | ICD-10-CM | POA: Diagnosis not present

## 2021-12-11 DIAGNOSIS — J3089 Other allergic rhinitis: Secondary | ICD-10-CM | POA: Diagnosis not present

## 2021-12-11 DIAGNOSIS — I10 Essential (primary) hypertension: Secondary | ICD-10-CM | POA: Diagnosis not present

## 2021-12-11 DIAGNOSIS — L309 Dermatitis, unspecified: Secondary | ICD-10-CM | POA: Diagnosis not present

## 2021-12-11 DIAGNOSIS — E7800 Pure hypercholesterolemia, unspecified: Secondary | ICD-10-CM | POA: Diagnosis not present

## 2021-12-11 DIAGNOSIS — R202 Paresthesia of skin: Secondary | ICD-10-CM | POA: Diagnosis not present

## 2021-12-11 MED ORDER — FLUTICASONE PROPIONATE 50 MCG/ACT NA SUSP
1.0000 | Freq: Every day | NASAL | 3 refills | Status: DC
  Filled 2021-12-11: qty 16, 60d supply, fill #0
  Filled 2022-05-15: qty 16, 60d supply, fill #1
  Filled 2022-08-11: qty 16, 60d supply, fill #2
  Filled 2022-10-07: qty 16, 60d supply, fill #3

## 2021-12-11 MED ORDER — KETOCONAZOLE 2 % EX CREA
TOPICAL_CREAM | Freq: Two times a day (BID) | CUTANEOUS | 3 refills | Status: DC
Start: 2021-12-11 — End: 2022-11-14
  Filled 2021-12-11: qty 60, 20d supply, fill #0
  Filled 2022-02-07: qty 60, 20d supply, fill #1
  Filled 2022-05-15: qty 60, 20d supply, fill #2
  Filled 2022-06-04: qty 60, 20d supply, fill #3

## 2021-12-20 ENCOUNTER — Other Ambulatory Visit (HOSPITAL_COMMUNITY): Payer: Self-pay

## 2021-12-20 MED ORDER — MELOXICAM 15 MG PO TABS
15.0000 mg | ORAL_TABLET | Freq: Every day | ORAL | 0 refills | Status: DC
  Filled 2021-12-20: qty 90, 90d supply, fill #0

## 2021-12-26 ENCOUNTER — Other Ambulatory Visit (HOSPITAL_COMMUNITY): Payer: Self-pay

## 2021-12-26 MED ORDER — HYDROCODONE-ACETAMINOPHEN 5-325 MG PO TABS
1.0000 | ORAL_TABLET | ORAL | 0 refills | Status: DC | PRN
  Filled 2021-12-26: qty 8, 2d supply, fill #0

## 2022-01-03 ENCOUNTER — Other Ambulatory Visit (HOSPITAL_COMMUNITY): Payer: Self-pay

## 2022-02-05 DIAGNOSIS — D225 Melanocytic nevi of trunk: Secondary | ICD-10-CM | POA: Diagnosis not present

## 2022-02-05 DIAGNOSIS — D1801 Hemangioma of skin and subcutaneous tissue: Secondary | ICD-10-CM | POA: Diagnosis not present

## 2022-02-05 DIAGNOSIS — Z85820 Personal history of malignant melanoma of skin: Secondary | ICD-10-CM | POA: Diagnosis not present

## 2022-02-05 DIAGNOSIS — L814 Other melanin hyperpigmentation: Secondary | ICD-10-CM | POA: Diagnosis not present

## 2022-02-05 DIAGNOSIS — L821 Other seborrheic keratosis: Secondary | ICD-10-CM | POA: Diagnosis not present

## 2022-02-05 DIAGNOSIS — L570 Actinic keratosis: Secondary | ICD-10-CM | POA: Diagnosis not present

## 2022-02-05 DIAGNOSIS — D2271 Melanocytic nevi of right lower limb, including hip: Secondary | ICD-10-CM | POA: Diagnosis not present

## 2022-02-05 DIAGNOSIS — D224 Melanocytic nevi of scalp and neck: Secondary | ICD-10-CM | POA: Diagnosis not present

## 2022-02-05 DIAGNOSIS — Z85828 Personal history of other malignant neoplasm of skin: Secondary | ICD-10-CM | POA: Diagnosis not present

## 2022-02-07 ENCOUNTER — Other Ambulatory Visit (HOSPITAL_COMMUNITY): Payer: Self-pay

## 2022-02-09 ENCOUNTER — Ambulatory Visit: Payer: 59 | Admitting: Hematology & Oncology

## 2022-02-09 ENCOUNTER — Other Ambulatory Visit: Payer: 59

## 2022-02-14 ENCOUNTER — Other Ambulatory Visit (HOSPITAL_COMMUNITY): Payer: Self-pay

## 2022-02-14 MED ORDER — LISINOPRIL-HYDROCHLOROTHIAZIDE 20-25 MG PO TABS
1.0000 | ORAL_TABLET | Freq: Every day | ORAL | 1 refills | Status: DC
  Filled 2022-02-14: qty 90, 90d supply, fill #0
  Filled 2022-05-15: qty 90, 90d supply, fill #1

## 2022-03-28 ENCOUNTER — Other Ambulatory Visit (HOSPITAL_COMMUNITY): Payer: Self-pay

## 2022-03-29 ENCOUNTER — Other Ambulatory Visit (HOSPITAL_COMMUNITY): Payer: Self-pay

## 2022-03-29 MED ORDER — MELOXICAM 15 MG PO TABS
15.0000 mg | ORAL_TABLET | Freq: Every day | ORAL | 0 refills | Status: DC
  Filled 2022-03-29: qty 90, 90d supply, fill #0

## 2022-03-29 MED ORDER — ROSUVASTATIN CALCIUM 10 MG PO TABS
10.0000 mg | ORAL_TABLET | Freq: Every day | ORAL | 1 refills | Status: DC
  Filled 2022-03-29: qty 90, 90d supply, fill #0
  Filled 2022-06-26: qty 90, 90d supply, fill #1

## 2022-04-11 ENCOUNTER — Ambulatory Visit: Payer: 59 | Admitting: Neurology

## 2022-04-11 ENCOUNTER — Encounter: Payer: Self-pay | Admitting: Neurology

## 2022-04-11 VITALS — BP 125/69 | HR 52 | Ht 68.0 in | Wt 254.4 lb

## 2022-04-11 DIAGNOSIS — E519 Thiamine deficiency, unspecified: Secondary | ICD-10-CM | POA: Diagnosis not present

## 2022-04-11 DIAGNOSIS — R202 Paresthesia of skin: Secondary | ICD-10-CM | POA: Diagnosis not present

## 2022-04-11 DIAGNOSIS — G5602 Carpal tunnel syndrome, left upper limb: Secondary | ICD-10-CM | POA: Diagnosis not present

## 2022-04-11 DIAGNOSIS — M48062 Spinal stenosis, lumbar region with neurogenic claudication: Secondary | ICD-10-CM

## 2022-04-11 DIAGNOSIS — R29818 Other symptoms and signs involving the nervous system: Secondary | ICD-10-CM

## 2022-04-11 DIAGNOSIS — E538 Deficiency of other specified B group vitamins: Secondary | ICD-10-CM

## 2022-04-11 DIAGNOSIS — R200 Anesthesia of skin: Secondary | ICD-10-CM | POA: Diagnosis not present

## 2022-04-11 DIAGNOSIS — E531 Pyridoxine deficiency: Secondary | ICD-10-CM

## 2022-04-11 NOTE — Progress Notes (Signed)
[facility]    Provider:  Dr Jaynee Eagles Requesting Provider: Harrison Mons, PA Primary Care Provider:  Harrison Mons, PA  CC:  numbness in fingers and toes  HPI:  Charles [name] is a 66 y.o. male here as requested by Harrison Mons, PA for numbness in the left hand. It is digits 1-3 on the left. It wakes him up at night. No neck pain or shooting pain into the hand. Just on the left. He also has arthritis in his hand. Fingers 1-3 stay numb on the left.   He has pain in his buttocks and radiates down the back of the leg, worse when walking, and shoots down the legs and they become numb. He can sit down for a few minutes and the symptoms resolve ans then he starts walking again. He had therapy/PT on the left leg, been under the care of several doctors, started on the left and now worsening spreading to the right. If he bends over a cart it is better. There is a family history of both lumbar spinal stenosis and cervical spinal stenosis. He does have some numbness and tingling on the bottom of his feet. He has been told he has prediabetes for 20 years and recent hgba1c 6.3. No other focal neurologic deficits, associated symptoms, inciting events or modifiable factors.   He had chemotherapy in 2014 for 6 months, symptoms did not coincide with the chemotherapy started way after the chemotherapy.   Reviewed notes, labs and imaging from outside physicians, which showed:   07/2017: XR lumbar spine  IMPRESSION: No acute finding.   Mild appearing lumbar spondylosis.   Atherosclerosis.  Review of Systems: Patient complains of symptoms per HPI as well as the following symptoms paresthesias. Pertinent negatives and positives per HPI. All others negative.   Social History   Socioeconomic History   Marital status: Married    Spouse name: Not on file   Number of children: Not on file   Years of education: Not on file   Highest education level: Not on file  Occupational  History   Occupation: [employer]  Tobacco Use   Smoking status: Former    Packs/day: 2.00    Years: 20.00    Total pack years: 40.00    Types: Cigarettes    Start date: 08/24/1978    Quit date: 08/24/1998    Years since quitting: 23.6   Smokeless tobacco: Never   Tobacco comments:    quit smoking 15 years ago  Vaping Use   Vaping Use: Never used  Substance and Sexual Activity   Alcohol use: Yes    Alcohol/week: 0.0 standard drinks of alcohol    Comment: 1 a week   Drug use: No   Sexual activity: Yes    Partners: Female    Birth control/protection: None  Other Topics Concern   Not on file  Social History Narrative   Married. Education: [employer]. Exercise: Walk    Education: [employer]   Works for [employer] in [place] Strain: Not on [employer] Insecurity: Not on file  Transportation Needs: Not on file  Physical Activity: Not on file  Stress: Not on file  Social Connections: Not on file  Intimate Partner Violence: Not on file    Family History  Problem Relation Age of Onset   Diabetes Mother    Hyperlipidemia Father    Cancer Sister    Breast cancer Sister  Melanoma Sister    Cancer Brother 40       prostate cancer stage 4   Cancer Brother 28       prostate   Cancer Brother    Cancer - Colon Brother    Colon cancer Brother    Neuropathy Neg Hx     Past Medical History:  Diagnosis Date   Allergy    Arthritis    CLL (chronic lymphoblastic leukemia) 08/03/2011   CLL (chronic lymphocytic leukemia) ([place]) 03/18/2012   GERD (gastroesophageal reflux disease)    Hyperlipidemia    Hypertension    Shingles    Ulcer     Patient Active Problem List   Diagnosis Date Noted   Elevated LFTs 05/23/2020   Acute medial meniscus tear of left knee 12/24/2019   Hearing loss 02/19/2019   History of shingles 02/19/2019   Family history of prostate cancer 08/28/2018   Poor dentition 08/28/2018   Essential  hypertension, benign 08/02/2016   Pre-diabetes 07/14/2015   Obesity 07/14/2015   GERD (gastroesophageal reflux disease) 12/25/2012   Pure hypercholesterolemia 12/25/2012   CLL (chronic lymphocytic leukemia) ([place]) 03/18/2012    Past Surgical History:  Procedure Laterality Date   HERNIA REPAIR     KNEE ARTHROSCOPY WITH LATERAL MENISECTOMY Left 12/24/2019   Procedure: KNEE ARTHROSCOPY WITH PARTIAL LATERAL MENISECTOMY;  Surgeon: Mcarthur Rossetti, MD;  Location: [facility];  Service: Orthopedics;  Laterality: Left;   KNEE SURGERY     SHOULDER SURGERY     WRIST SURGERY      Current Outpatient Medications  Medication Sig Dispense Refill   Ascorbic Acid 500 MG CHEW Chew 500 mg by mouth daily.     aspirin 81 MG tablet Take 81 mg by mouth daily.     cetirizine (ZYRTEC) 10 MG tablet Take 10 mg by mouth daily.     Cholecalciferol (VITAMIN D3) 2000 UNITS TABS Take 2,000 Units by mouth.     esomeprazole (NEXIUM) 40 MG capsule Take 1 capsule (40 mg total) by mouth daily. 90 capsule 3   fluticasone (FLONASE) 50 MCG/ACT nasal spray Place 1 spray into both nostrils daily. 48 g 3   HYDROcodone-acetaminophen (NORCO/VICODIN) 5-325 MG tablet Take 1 tablet by mouth every 4-6 hours as needed for pain 8 tablet 0   ibuprofen (ADVIL) 800 MG tablet Take by mouth.     ketoconazole (NIZORAL) 2 % cream Apply topically 2 (two) times daily. 60 g 3   lisinopril-hydrochlorothiazide (ZESTORETIC) 20-25 MG tablet Take one tablet by mouth daily. 90 tablet 1   meloxicam (MOBIC) 15 MG tablet Take 1 tablet (15 mg total) by mouth daily. 90 tablet 0   rosuvastatin (CRESTOR) 10 MG tablet Take 1 tablet (10 mg total) by mouth daily. 90 tablet 1   tadalafil (CIALIS) 10 MG tablet Take 1 to 2 tablets (10-20 mg dose) by mouth daily as needed for Erectile Dysfunction. 30 tablet 0   No current facility-administered medications for this visit.    Allergies as of 04/11/2022   (No Known Allergies)     Vitals: BP 125/69   Pulse (!) 52   Ht 5' 8"  (1.727 m)   Wt 254 lb 6.4 oz (115.4 kg)   BMI 38.68 kg/m  Last Weight:  Wt Readings from Last 1 Encounters:  04/11/22 254 lb 6.4 oz (115.4 kg)   Last Height:   Ht Readings from Last 1 Encounters:  04/11/22 5' 8"  (1.727 m)     Physical exam: Exam: Gen:  NAD, conversant, well nourised, obese, well groomed                     CV: RRR, no MRG. No Carotid Bruits. No peripheral edema, warm, nontender Eyes: Conjunctivae clear without exudates or hemorrhage  Neuro: Detailed Neurologic Exam  Speech:    Speech is normal; fluent and spontaneous with normal comprehension.  Cognition:    The patient is oriented to person, place, and time;     recent and remote memory intact;     language fluent;     normal attention, concentration,     fund of knowledge Cranial Nerves:    The pupils are equal, round, and reactive to light. Attempted, pupils too small to visualize fundi.. Visual fields are full to finger confrontation. Extraocular movements are intact. Trigeminal sensation is intact and the muscles of mastication are normal. The face is symmetric. The palate elevates in the midline. Hearing intact. Voice is normal. Shoulder shrug is normal. The tongue has normal motion without fasciculations.   Coordination:    Normal   Gait:    normal.   Motor Observation:    No asymmetry, no atrophy, and no involuntary movements noted. Tone:    Normal muscle tone.    Posture:    Posture is normal. normal erect    Strength:    Strength is V/V in the upper and lower limbs.      Sensation: intact to LT, pin prick and temp distally     Reflex Exam:  DTR's:    Absent AJS. Deep tendon reflexes in the upper and lower extremities are hyporeflexic but symmetrical bilaterally.   Toes:    The toes are downgoing bilaterally.   Clonus:    Clonus is absent.    Assessment/Plan:  [age] year old patient   Left hand numbness digits 1-3: Likely  CTS needs emg/ncs if negative will MRI cervical spine 2, Spinal stenosis with neurogenic claudication: MRI lumbar spine 3. Numbness/tingling bottom of feet: Likely years of pre-diabetes but will perform a  serum review (could also have been affected by chemotherapy but only started 2 years ago and chemo was in 2014 per patient, recommend treating hgba1c of 6.3). Also has a hx of smoking 3 packs a day, may have affected nerves as well. Spinal stenosis may affect distal feet as well. Central obesity is an independent risk factor for neuropathy as well.  4. Recommended alpha lipoic acid (-May consider daily alpha lipoic acid which is an antioxidant that may reduce free radical oxidative stress associated with diabetic polyneuropathy, existing evidence suggests that alpha lipoic acid significantly reduces stabbing, lancinating and burning pain and diabetic neuropathy with its onset of action as early as 1-2 weeks. 458m twice daily daily. )  Orders Placed This Encounter  Procedures   MR LUMBAR SPINE WO CONTRAST   B12 and Folate Panel   Methylmalonic acid, serum   Vitamin B6   Multiple Myeloma Panel (SPEP&IFE w/QIG)   Vitamin B1   NCV with EMG(electromyography)   Spinal stenosis of lumbar region with neurogenic claudication - Plan: MR LUMBAR SPINE WO CONTRAST  Neurogenic claudication - Plan: MR LUMBAR SPINE WO CONTRAST  Numbness in both legs - Plan: B12 and Folate Panel, Methylmalonic acid, serum, Vitamin B6, Multiple Myeloma Panel (SPEP&IFE w/QIG), Vitamin B1, MR LUMBAR SPINE WO CONTRAST  Numbness and tingling of both feet - Plan: B12 and Folate Panel, Methylmalonic acid, serum, Vitamin B6, Multiple Myeloma Panel (SPEP&IFE w/QIG), Vitamin B1,  MR LUMBAR SPINE WO CONTRAST  screen for vitamin B12 deficiency - Plan: B12 and Folate Panel, Methylmalonic acid, serum  screen for Vitamin B1 deficiency - Plan: Vitamin B1  screen Vitamin B6 deficiency - Plan: Vitamin B6  Carpal tunnel syndrome on left  - Plan: NCV with EMG(electromyography)   Cc: Harrison Mons, PA,  Harrison Mons, [place]  Sarina Ill, MD  [facility] [address] [place], [place] [address]  Phone [phone] Fax [phone]  I spent over 60 minutes of face-to-face and non-face-to-face time with patient on the  1. Spinal stenosis of lumbar region with neurogenic claudication   2. Neurogenic claudication   3. Numbness in both legs   4. Numbness and tingling of both feet   5. screen for vitamin B12 deficiency   6. screen for Vitamin B1 deficiency   7. screen Vitamin B6 deficiency   8. Carpal tunnel syndrome on left    diagnosis.  This included previsit chart review, lab review, study review, order entry, electronic health record documentation, patient education on the different diagnostic and therapeutic options, counseling and coordination of care, risks and benefits of management, compliance, or risk factor reduction

## 2022-04-11 NOTE — Patient Instructions (Addendum)
EMG/NCS of the left hand: For likely Carpal Tunnel Syndrome. 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 '400mg'$  twice daily daily.      Spinal Stenosis  Spinal stenosis is a condition that happens when the spinal canal narrows. The spinal canal is the space between the bones of your spine (vertebrae). This narrowing puts pressure on the spinal cord and nerves that exit the spine and run down the arms or legs. When nerves exiting the spine are pinched, it can cause pain, numbness, or weakness in the arms or legs. Spinal stenosis can affect the vertebrae in the neck, upper back, and lower back. Spinal stenosis can range from mild to severe. What are the causes? This condition is caused by areas of bone pushing into the spinal canal. This condition may be present at birth (congenital), or it may be caused by: Slow breakdown of your vertebrae (spinal degeneration). This usually starts between 87 and 20 years of age. Injury (trauma) to your spine. Previous spinal surgery. Tumors in your spine. Calcium deposits in your spine. What increases the risk? The following factors may make you more likely to develop this condition: Being older than age 45. Being born with an abnormally shaped spine (congenitalspinal deformity), such as scoliosis. Having arthritis. What are the signs or symptoms? Symptoms of this condition  include: Pain in the neck or back that is generally worse with activities, particularly when standing or walking. Numbness, tingling, hot or cold sensations, weakness, or tiredness (fatigue) in your arms or legs. This can happen in one arm or leg, or both. Pain going from the buttock, down the thigh, and to the calf (sciatica). This can happen in one or both legs. Falling frequently. Foot drop. This is when you have trouble lifting the front part of your foot and it drags on the ground when you walk. This can lead to muscle weakness. In more severe cases, you may develop: Problems having a bowel movement or urinating. Difficulty having sex. Loss of feeling in your legs and inability to walk. Symptoms may come on slowly and get worse over time. In some cases, there are no symptoms. How is this diagnosed? This condition is diagnosed based on your medical history and a physical exam. You may also have tests, such as an X-ray, CT scan, or MRI. How is this treated? Treatment for this condition often focuses on managing your pain and any other symptoms. Treatment may include: Practicing good posture to lessen pressure on your nerves. Exercises to strengthen muscles, build endurance, improve balance, and maintain range of motion. This may include physical therapy to restore movement and strength to your back. Losing weight, if needed. Medicines to reduce inflammation or pain. This may include a medicine that is injected into your spine (steroidinjection). Assistive devices, such as a corset or brace. In some cases, surgery may be needed. The most common procedure is decompression laminectomy. This removes excess bone that puts pressure on your nerve roots. Follow these instructions at home: Managing pain, stiffness, and swelling  Practice good posture. If you were given a brace or a corset, wear it as told by your health care provider. Maintain a healthy weight. Talk with your health care provider  if you need help losing weight. If directed, apply heat to the affected area as often as told by your health care provider. Use the heat source that your health care provider recommends, such as a moist heat pack or a heating pad. Place a towel between your skin and the heat source. Leave the heat on for 20-30 minutes. If your skin turns bright red, remove the heat right away to prevent burns. The risk of burns is higher if you cannot feel pain, heat, or cold. Activity Do all exercises and stretches as told by your health care provider. Do not do any activities that cause pain. You may have to avoid lifting. Ask your health care provider how much you can safely lift. Return to your normal activities as told by your health care provider. Ask your health care provider what activities are safe for you. General instructions Take over-the-counter and prescription medicines only as told by your health care provider. Do not use any products that contain nicotine or tobacco. These products include cigarettes, chewing tobacco, and vaping devices, such as e-cigarettes. If you need help quitting, ask your health care provider. Eat a healthy diet. This includes plenty of fruits and vegetables, whole grains, and low-fat (lean) protein. Where to find more information [employer] of Arthritis and Musculoskeletal and Skin Diseases: www.niams.SouthExposed.es Contact a health care provider if: Your symptoms do not get better or they get worse. You have a fever. Get help right away if: You have new pain or symptoms of severe pain, such as: New or worsening pain in your neck or upper back. Severe pain that cannot be controlled with medicines. A severe headache that gets worse when you stand. You are dizzy. You have vision problems, such as blurred vision or double vision. You have nausea or vomiting. You develop new or worsening numbness or tingling in your back or legs. You lose control of your bowels or  bladder. You have pain, redness, swelling, or warmth in your arm or leg. These symptoms may be an emergency. Get help right away. Call 911. Do not wait to see if the symptoms will go away. Do not drive yourself to the hospital. Summary Spinal stenosis is a condition that happens when the spinal canal narrows, putting pressure on the spinal cord or nerves that exit the vertebrae. This condition is caused by areas of bone pushing into the spinal canal. Spinal stenosis can cause numbness, weakness, or pain in the buttocks, neck, back, arms, and legs. This condition is usually diagnosed with your medical history, a physical exam, and tests, such as an X-ray, CT scan, or MRI. This information is not intended to replace advice given to you by your health care provider. Make sure you discuss any questions you have with your health care provider. Document Revised: 10/03/2021 Document Reviewed: 10/03/2021 Elsevier Patient Education  [place] Syndrome  Carpal tunnel syndrome is a condition that causes pain, numbness, and weakness in your hand and fingers. The carpal tunnel is a narrow area located on the palm side of your wrist. Repeated wrist motion or certain diseases may cause swelling within the tunnel. This swelling pinches the main nerve in the wrist. The main nerve in the wrist is called the median nerve. What are the causes? This condition may  be caused by: Repeated and forceful wrist and hand motions. Wrist injuries. Arthritis. A cyst or tumor in the carpal tunnel. Fluid buildup during pregnancy. Use of tools that vibrate. Sometimes the cause of this condition is not known. What increases the risk? The following factors may make you more likely to develop this condition: Having a job that requires you to repeatedly or forcefully move your wrist or hand or requires you to use tools that vibrate. This may include jobs that involve using computers, working on an  [employer], or working with [employer] such as Pension scheme manager. Being a woman. Having certain conditions, such as: Diabetes. Obesity. An underactive thyroid (hypothyroidism). Kidney failure. Rheumatoid arthritis. What are the signs or symptoms? Symptoms of this condition include: A tingling feeling in your fingers, especially in your thumb, index, and middle fingers. Tingling or numbness in your hand. An aching feeling in your entire arm, especially when your wrist and elbow are bent for a long time. Wrist pain that goes up your arm to your shoulder. Pain that goes down into your palm or fingers. A weak feeling in your hands. You may have trouble grabbing and holding items. Your symptoms may feel worse during the night. How is this diagnosed? This condition is diagnosed with a medical history and physical exam. You may also have tests, including: Electromyogram (EMG). This test measures electrical signals sent by your nerves into the muscles. Nerve conduction study. This test measures how well electrical signals pass through your nerves. Imaging tests, such as X-rays, ultrasound, and MRI. These tests check for possible causes of your condition. How is this treated? This condition may be treated with: Lifestyle changes. It is important to stop or change the activity that caused your condition. Doing exercise and activities to strengthen and stretch your muscles and tendons (physical therapy). Making lifestyle changes to help with your condition and learning how to do your daily activities safely (occupational therapy). Medicines for pain and inflammation. This may include medicine that is injected into your wrist. A wrist splint or brace. Surgery. Follow these instructions at home: If you have a splint or brace: Wear the splint or brace as told by your health care provider. Remove it only as told by your health care provider. Loosen the splint or brace if your fingers tingle,  become numb, or turn cold and blue. Keep the splint or brace clean. If the splint or brace is not waterproof: Do not let it get wet. Cover it with a watertight covering when you take a bath or shower. Managing pain, stiffness, and swelling If directed, put ice on the painful area. To do this: If you have a removeable splint or brace, remove it as told by your health care provider. Put ice in a plastic bag. Place a towel between your skin and the bag or between the splint or brace and the bag. Leave the ice on for 20 minutes, 2-3 times a day. Do not fall asleep with the cold pack on your skin. Remove the ice if your skin turns bright red. This is very important. If you cannot feel pain, heat, or cold, you have a greater risk of damage to the area. Move your fingers often to reduce stiffness and swelling. General instructions Take over-the-counter and prescription medicines only as told by your health care provider. Rest your wrist and hand from any activity that may be causing your pain. If your condition is work related, talk with your employer about changes  that can be made, such as getting a wrist pad to use while typing. Do any exercises as told by your health care provider, physical therapist, or occupational therapist. Keep all follow-up visits. This is important. Contact a health care provider if: You have new symptoms. Your pain is not controlled with medicines. Your symptoms get worse. Get help right away if: You have severe numbness or tingling in your wrist or hand. Summary Carpal tunnel syndrome is a condition that causes pain, numbness, and weakness in your hand and fingers. It is usually caused by repeated wrist motions. Lifestyle changes and medicines are used to treat carpal tunnel syndrome. Surgery may be recommended. Follow your health care provider's instructions about wearing a splint, resting from activity, keeping follow-up visits, and calling for help. This  information is not intended to replace advice given to you by your health care provider. Make sure you discuss any questions you have with your health care provider. Document Revised: 11/19/2019 Document Reviewed: 11/19/2019 Elsevier Patient Education  [place].   Peripheral Neuropathy Peripheral neuropathy is a type of nerve damage. It affects nerves that carry signals between the spinal cord and the arms, legs, and the rest of the body (peripheral nerves). It does not affect nerves in the spinal cord or brain. In peripheral neuropathy, one nerve or a group of nerves may be damaged. Peripheral neuropathy is a broad category that includes many specific nerve disorders, like diabetic neuropathy, hereditary neuropathy, and carpal tunnel syndrome. What are the causes? This condition may be caused by: Certain diseases, such as: Diabetes. This is the most common cause of peripheral neuropathy. Autoimmune diseases, such as rheumatoid arthritis and systemic lupus erythematosus. Nerve diseases that are passed from parent to child (inherited). Kidney disease. Thyroid disease. Other causes may include: Nerve injury. Pressure or stress on a nerve that lasts a long time. Lack (deficiency) of B vitamins. This can result from alcoholism, poor diet, or a restricted diet. Infections. Some medicines, such as cancer medicines (chemotherapy). Poisonous (toxic) substances, such as lead and mercury. Too little blood flowing to the legs. In some cases, the cause of this condition is not known. What are the signs or symptoms? Symptoms of this condition depend on which of your nerves is damaged. Symptoms in the legs, hands, and arms can include: Loss of feeling (numbness) in the feet, hands, or both. Tingling in the feet, hands, or both. Burning pain. Very sensitive skin. Weakness. Not being able to move a part of the body (paralysis). Clumsiness or poor coordination. Muscle twitching. Loss of  balance. Symptoms in other parts of the body can include: Not being able to control your bladder. Feeling dizzy. Sexual problems. How is this diagnosed? Diagnosing and finding the cause of peripheral neuropathy can be difficult. Your health care provider will take your medical history and do a physical exam. A neurological exam will also be done. This involves checking things that are affected by your brain, spinal cord, and nerves (nervous system). For example, your health care provider will check your reflexes, how you move, and what you can feel. You may have other tests, such as: Blood tests. Electromyogram (EMG) and nerve conduction tests. These tests check nerve function and how well the nerves are controlling the muscles. Imaging tests, such as a CT scan or MRI, to rule out other causes of your symptoms. Removing a small piece of nerve to be examined in a lab (nerve biopsy). Removing and examining a small amount of the  fluid that surrounds the brain and spinal cord (lumbar puncture). How is this treated? Treatment for this condition may involve: Treating the underlying cause of the neuropathy, such as diabetes, kidney disease, or vitamin deficiencies. Stopping medicines that can cause neuropathy, such as chemotherapy. Medicine to help relieve pain. Medicines may include: Prescription or over-the-counter pain medicine. Anti-seizure medicine. Antidepressants. Pain-relieving patches that are applied to painful areas of skin. Surgery to relieve pressure on a nerve or to destroy a nerve that is causing pain. Physical therapy to help improve movement and balance. Devices to help you move around (assistive devices). Follow these instructions at home: Medicines Take over-the-counter and prescription medicines only as told by your health care provider. Do not take any other medicines without first asking your health care provider. Ask your health care provider if the medicine prescribed to  you requires you to avoid driving or using machinery. Lifestyle  Do not use any products that contain nicotine or tobacco. These products include cigarettes, chewing tobacco, and vaping devices, such as e-cigarettes. Smoking keeps blood from reaching damaged nerves. If you need help quitting, ask your health care provider. Avoid or limit alcohol. Too much alcohol can cause a vitamin B deficiency, and vitamin B is needed for healthy nerves. Eat a healthy diet. This includes: Eating foods that are high in fiber, such as beans, whole grains, and fresh fruits and vegetables. Limiting foods that are high in fat and processed sugars, such as fried or sweet foods. General instructions  If you have diabetes, work closely with your health care provider to keep your blood sugar under control. If you have numbness in your feet: Check every day for signs of injury or infection. Watch for redness, warmth, and swelling. Wear padded socks and comfortable shoes. These help protect your feet. Develop a good support system. Living with peripheral neuropathy can be stressful. Consider talking with a mental health specialist or joining a support group. Use assistive devices and attend physical therapy as told by your health care provider. This may include using a walker or a cane. Keep all follow-up visits. This is important. Where to find more information [employer] of Neurological Disorders: MasterBoxes.it Contact a health care provider if: You have new signs or symptoms of peripheral neuropathy. You are struggling emotionally from dealing with peripheral neuropathy. Your pain is not well controlled. Get help right away if: You have an injury or infection that is not healing normally. You develop new weakness in an arm or leg. You have fallen or do so frequently. Summary Peripheral neuropathy is when the nerves in the arms or legs are damaged, resulting in numbness, weakness, or pain. There are  many causes of peripheral neuropathy, including diabetes, pinched nerves, vitamin deficiencies, autoimmune disease, and hereditary conditions. Diagnosing and finding the cause of peripheral neuropathy can be difficult. Your health care provider will take your medical history, do a physical exam, and do tests, including blood tests and nerve function tests. Treatment involves treating the underlying cause of the neuropathy and taking medicines to help control pain. Physical therapy and assistive devices may also help. This information is not intended to replace advice given to you by your health care provider. Make sure you discuss any questions you have with your health care provider. Document Revised: 03/14/2021 Document Reviewed: 03/14/2021 Elsevier Patient Education  2023 [place] is a test to check how well your muscles and nerves are working. This procedure includes the combined use of electromyogram (EMG)  and nerve conduction study (NCS). EMG is used to evaluate muscles and the nerves that control those muscles. NCS, which is also called electroneurogram, measures how well your nerves conduct electricity. The procedures should be done together to check if your muscles and nerves are healthy. If the results of the tests are abnormal, this may indicate disease or injury, such as a neuromuscular disease or peripheral nerve damage. Tell a health care provider about: Any allergies you have. All medicines you are taking, including vitamins, herbs, eye drops, creams, and over-the-counter medicines. Any bleeding problems you have. Any surgeries you have had. Any medical conditions you have. What are the risks? Generally, this is a safe procedure. However, problems may occur, including: Bleeding or bruising. Infection where the electrodes were inserted. What happens before the test? Medicines Take all of your usually prescribed medications before this  testing is performed. Do not stop your blood thinners unless advised by your prescribing physician. General instructions Your health care provider may ask you to warm the limb that will be checked with warm water, hot pack, or wrapping the limb in a blanket. Do not use lotions or creams on the same day that you will be having the procedure. What happens during the test? For EMG  Your health care provider will ask you to stay in a position so that the muscle being studied can be accessed. You will be sitting or lying down. You may be given a medicine to numb the area (local anesthetic) and the skin will be disinfected. A very thin needle that has an electrode will be inserted into your muscle, one muscle at a time. Typically, multiple muscles are evaluated during a single study. Another small electrode will be placed on your skin near the muscle. Your health care provider will ask you to continue to remain still. The electrodes will record the electrical activity of your muscles. You may see this on a monitor or hear it in the room. After your muscles have been studied at rest, your health care provider will ask you to contract or flex your muscles. The electrodes will record the electrical activity of your muscles. Your health care provider will remove the electrodes and the electrode needle when the procedure is finished. The procedure may vary among health care providers and hospitals. For NCS  An electrode that records your nerve activity (recording electrode) will be placed on your skin by the muscle that is being studied. An electrode that is used as a reference (reference electrode) will be placed near the recording electrode. A paste or gel will be applied to your skin between the recording electrode and the reference electrode. Your nerve will be stimulated with a mild shock. The speed of the nerves and strength of response is recorded by the electrodes. Your health care provider will  remove the electrodes and the gel when the procedure is finished. The procedure may vary among health care providers and hospitals. What can I expect after the test? It is up to you to get your test results. Ask your health care provider, or the department that is doing the test, when your results will be ready. Your health care provider may: Give you medicines for any pain. Monitor the insertion sites to make sure that bleeding stops. You should be able to drive yourself to and from the test. Discomfort can persist for a few hours after the test, but should be better the next day. Contact a health care provider if: You have  swelling, redness, or drainage at any of the insertion sites. Summary Electromyoneurogram is a test to check how well your muscles and nerves are working. If the results of the tests are abnormal, this may indicate disease or injury. This is a safe procedure. However, problems may occur, such as bleeding and infection. Your health care provider will do two tests to complete this procedure. One checks your muscles (EMG) and another checks your nerves (NCS). It is up to you to get your test results. Ask your health care provider, or the department that is doing the test, when your results will be ready. This information is not intended to replace advice given to you by your health care provider. Make sure you discuss any questions you have with your health care provider. Document Revised: 03/22/2021 Document Reviewed: 02/19/2021 Elsevier Patient Education  [place].

## 2022-04-12 ENCOUNTER — Telehealth: Payer: Self-pay | Admitting: Neurology

## 2022-04-12 NOTE — Telephone Encounter (Signed)
UMR NPR sent to [facility] per patient request

## 2022-04-13 ENCOUNTER — Ambulatory Visit (HOSPITAL_COMMUNITY)
Admission: RE | Admit: 2022-04-13 | Discharge: 2022-04-13 | Disposition: A | Discharge status: Home / Self Care | Payer: 59 | Source: Ambulatory Visit | Attending: Neurology | Admitting: Neurology

## 2022-04-13 DIAGNOSIS — R202 Paresthesia of skin: Secondary | ICD-10-CM | POA: Insufficient documentation

## 2022-04-13 DIAGNOSIS — M47816 Spondylosis without myelopathy or radiculopathy, lumbar region: Secondary | ICD-10-CM | POA: Diagnosis not present

## 2022-04-13 DIAGNOSIS — R200 Anesthesia of skin: Secondary | ICD-10-CM | POA: Diagnosis not present

## 2022-04-13 DIAGNOSIS — M48062 Spinal stenosis, lumbar region with neurogenic claudication: Secondary | ICD-10-CM | POA: Insufficient documentation

## 2022-04-13 DIAGNOSIS — R29818 Other symptoms and signs involving the nervous system: Secondary | ICD-10-CM | POA: Insufficient documentation

## 2022-04-13 DIAGNOSIS — M4316 Spondylolisthesis, lumbar region: Secondary | ICD-10-CM | POA: Diagnosis not present

## 2022-04-16 ENCOUNTER — Telehealth: Payer: Self-pay | Admitting: Neurology

## 2022-04-16 DIAGNOSIS — M48062 Spinal stenosis, lumbar region with neurogenic claudication: Secondary | ICD-10-CM

## 2022-04-16 DIAGNOSIS — R200 Anesthesia of skin: Secondary | ICD-10-CM

## 2022-04-16 NOTE — Telephone Encounter (Signed)
Pod 4: see phone note: Hi Charles [name]: It does appear as though you have spinal stenosis in your lower back, I would like to send you to neurosurgery for evaluation, I will have my nurse call and see if that is okay with you and we can send you to [facility] unless you have a relationship with any other neurosurgeon or orthopedic surgeon you like to see instead.  Thank you Dr. Lavell Anchors (if he agrees please send a referral for lumbosacral spinal stenosis at [facility] neurosurgery or let me know and I can do it)

## 2022-04-17 NOTE — Telephone Encounter (Signed)
Patient returned my call. He had seen the message from Dr Jaynee Eagles regarding his MRI results. He said he would like to be referred to Dr Pieter Partridge Dawley. He has a friend who saw Dr Reatha Armour and liked him. Pt aware if he hasn't heard from [facility] within 2 weeks to give them a call. He has the office number. Pt was very appreciative.

## 2022-04-17 NOTE — Telephone Encounter (Signed)
I called the pt and LVM asking for call back to discuss results.

## 2022-04-17 NOTE — Addendum Note (Signed)
Addended by: Gildardo Griffes on: 04/17/2022 10:08 AM   Modules accepted: Orders

## 2022-04-18 ENCOUNTER — Telehealth: Payer: Self-pay | Admitting: Neurology

## 2022-04-18 NOTE — Telephone Encounter (Signed)
Sent to [facility] ph # [phone]. 

## 2022-04-20 DIAGNOSIS — M4316 Spondylolisthesis, lumbar region: Secondary | ICD-10-CM | POA: Diagnosis not present

## 2022-04-20 DIAGNOSIS — M48062 Spinal stenosis, lumbar region with neurogenic claudication: Secondary | ICD-10-CM | POA: Diagnosis not present

## 2022-04-25 LAB — MULTIPLE MYELOMA PANEL, SERUM
Albumin SerPl Elph-Mcnc: 3.8 g/dL (ref 2.9–4.4)
Albumin/Glob SerPl: 1.5 (ref 0.7–1.7)
Alpha 1: 0.2 g/dL (ref 0.0–0.4)
Alpha2 Glob SerPl Elph-Mcnc: 0.8 g/dL (ref 0.4–1.0)
B-Globulin SerPl Elph-Mcnc: 0.9 g/dL (ref 0.7–1.3)
Gamma Glob SerPl Elph-Mcnc: 0.7 g/dL (ref 0.4–1.8)
Globulin, Total: 2.7 g/dL (ref 2.2–3.9)
IgA/Immunoglobulin A, Serum: 136 mg/dL (ref 61–437)
IgG (Immunoglobin G), Serum: 726 mg/dL (ref 603–1613)
IgM (Immunoglobulin M), Srm: 70 mg/dL (ref 20–172)
Total Protein: 6.5 g/dL (ref 6.0–8.5)

## 2022-04-25 LAB — VITAMIN B1: Thiamine: 150.8 nmol/L (ref 66.5–200.0)

## 2022-04-25 LAB — VITAMIN B6: Vitamin B6: 6 ug/L (ref 3.4–65.2)

## 2022-04-25 LAB — B12 AND FOLATE PANEL
Folate: 5.1 ng/mL (ref >3.0)
Vitamin B-12: 487 pg/mL (ref 232–1245)

## 2022-04-25 LAB — METHYLMALONIC ACID, SERUM: Methylmalonic Acid: 189 nmol/L (ref 0–378)

## 2022-04-27 ENCOUNTER — Other Ambulatory Visit (HOSPITAL_COMMUNITY): Payer: 59

## 2022-05-14 DIAGNOSIS — E7800 Pure hypercholesterolemia, unspecified: Secondary | ICD-10-CM | POA: Diagnosis not present

## 2022-05-14 DIAGNOSIS — I10 Essential (primary) hypertension: Secondary | ICD-10-CM | POA: Diagnosis not present

## 2022-05-14 DIAGNOSIS — M4316 Spondylolisthesis, lumbar region: Secondary | ICD-10-CM | POA: Diagnosis not present

## 2022-05-14 DIAGNOSIS — M48061 Spinal stenosis, lumbar region without neurogenic claudication: Secondary | ICD-10-CM | POA: Insufficient documentation

## 2022-05-14 DIAGNOSIS — R7303 Prediabetes: Secondary | ICD-10-CM | POA: Diagnosis not present

## 2022-05-14 DIAGNOSIS — M48062 Spinal stenosis, lumbar region with neurogenic claudication: Secondary | ICD-10-CM | POA: Diagnosis not present

## 2022-05-14 DIAGNOSIS — Z01818 Encounter for other preprocedural examination: Secondary | ICD-10-CM | POA: Diagnosis not present

## 2022-05-15 ENCOUNTER — Other Ambulatory Visit (HOSPITAL_COMMUNITY): Payer: Self-pay

## 2022-05-20 ENCOUNTER — Other Ambulatory Visit (HOSPITAL_COMMUNITY): Payer: Self-pay

## 2022-05-22 ENCOUNTER — Other Ambulatory Visit: Payer: Self-pay | Admitting: Neurological Surgery

## 2022-05-22 ENCOUNTER — Other Ambulatory Visit (HOSPITAL_COMMUNITY): Payer: Self-pay

## 2022-05-22 MED ORDER — LISINOPRIL-HYDROCHLOROTHIAZIDE 20-25 MG PO TABS
1.0000 | ORAL_TABLET | Freq: Every day | ORAL | 1 refills | Status: DC
  Filled 2022-08-12: qty 90, 90d supply, fill #0
  Filled 2022-11-11: qty 90, 90d supply, fill #1

## 2022-05-25 ENCOUNTER — Other Ambulatory Visit: Payer: Self-pay | Admitting: Neurological Surgery

## 2022-05-25 ENCOUNTER — Other Ambulatory Visit (HOSPITAL_COMMUNITY): Payer: Self-pay

## 2022-05-25 NOTE — Pre-Procedure Instructions (Signed)
Surgical Instructions    Your procedure is scheduled on Tuesday, June 05, 2202 at 10:17 AM.  Report to [facility] Main Entrance "A" at 8:15 A.M., then check in with the Admitting office.  Call this number if you have problems the morning of surgery:  [phone]   If you have any questions prior to your surgery date call [phone]: Open Monday-Friday 8am-4pm  *If you experience any cold or flu symptoms such as cough, fever, chills, shortness of breath, etc. between now and your scheduled surgery, please notify us.*    Remember:  Do not eat or drink after midnight the night before your surgery    Take these medicines the morning of surgery with A SIP OF WATER:  acetaminophen (TYLENOL)  cetirizine (ZYRTEC)  esomeprazole (NEXIUM)  rosuvastatin (CRESTOR)   IF NEEDED: fluticasone (FLONASE)  HYDROcodone-acetaminophen (NORCO/VICODIN)   Follow your surgeon's instructions on when to stop Aspirin.  If no instructions were given by your surgeon then you will need to call the office to get those instructions.     As of today, STOP taking any Aleve, Naproxen, Ibuprofen, Motrin, Advil, Goody's, BC's, all herbal medications, fish oil, and all vitamins. This includes your meloxicam (MOBIC).          Do NOT Smoke (Tobacco/Vaping) for 24 hours prior to your procedure.  If you use a CPAP at night, you may bring your mask/headgear for your overnight stay.   Contacts, glasses, piercing's, hearing aid's, dentures or partials may not be worn into surgery, please bring cases for these belongings.    For patients admitted to the hospital, discharge time will be determined by your treatment team.   Patients discharged the day of surgery will not be allowed to drive home, and someone needs to stay with them for 24 hours.  SURGICAL WAITING ROOM VISITATION Patients having surgery or a procedure may have two support people in the waiting area. Visitors may stay in the waiting area during the  procedure and switch out with other visitors if needed. Children under the age of 39 must have an adult accompany them who is not the patient. If the patient needs to stay at the hospital during part of their recovery, the visitor guidelines for inpatient rooms apply.  Please refer to the [facility] website for the visitor guidelines for Inpatients (after your surgery is over and you are in a regular room).    Special instructions:   [facility]- Preparing For Surgery  Before surgery, you can play an important role. Because skin is not sterile, your skin needs to be as free of germs as possible. You can reduce the number of germs on your skin by washing with CHG (chlorahexidine gluconate) Soap before surgery.  CHG is an antiseptic cleaner which kills germs and bonds with the skin to continue killing germs even after washing.    Oral Hygiene is also important to reduce your risk of infection.  Remember - BRUSH YOUR TEETH THE MORNING OF SURGERY WITH YOUR REGULAR TOOTHPASTE  Please do not use if you have an allergy to CHG or antibacterial soaps. If your skin becomes reddened/irritated stop using the CHG.  Do not shave (including legs and underarms) for at least 48 hours prior to first CHG shower. It is OK to shave your face.  Please follow these instructions carefully.   Shower the NIGHT BEFORE SURGERY and the MORNING OF SURGERY  If you chose to wash your hair, wash your hair first as usual with  your normal shampoo.  After you shampoo, rinse your hair and body thoroughly to remove the shampoo.  Use CHG Soap as you would any other liquid soap. You can apply CHG directly to the skin and wash gently with a scrungie or a clean washcloth.   Apply the CHG Soap to your body ONLY FROM THE NECK DOWN.  Do not use on open wounds or open sores. Avoid contact with your eyes, ears, mouth and genitals (private parts). Wash Face and genitals (private parts)  with your normal soap.   Wash thoroughly, paying  special attention to the area where your surgery will be performed.  Thoroughly rinse your body with warm water from the neck down.  DO NOT shower/wash with your normal soap after using and rinsing off the CHG Soap.  Pat yourself dry with a CLEAN TOWEL.  Wear CLEAN PAJAMAS to bed the night before surgery  Place CLEAN SHEETS on your bed the night before your surgery  DO NOT SLEEP WITH PETS.   Day of Surgery: Take a shower with CHG soap. Do not wear jewelry. Do not wear lotions, powders, colognes, or deodorant. Do not shave 48 hours prior to surgery.  Men may shave face and neck. Do not bring valuables to the hospital.  [facility] is not responsible for any belongings or valuables. Wear Clean/Comfortable clothing the morning of surgery Do not apply any deodorants/lotions.   Remember to brush your teeth WITH YOUR REGULAR TOOTHPASTE.   Please read over the following fact sheets that you were given.  If you received a COVID test during your pre-op visit  it is requested that you wear a mask when out in public, stay away from anyone that may not be feeling well and notify your surgeon if you develop symptoms. If you have been in contact with anyone that has tested positive in the last 10 days please notify you surgeon.

## 2022-05-28 ENCOUNTER — Other Ambulatory Visit: Payer: Self-pay

## 2022-05-28 ENCOUNTER — Encounter (HOSPITAL_COMMUNITY)
Admission: RE | Admit: 2022-05-28 | Discharge: 2022-05-28 | Disposition: A | Discharge status: Home / Self Care | Payer: 59 | Source: Ambulatory Visit | Attending: Neurological Surgery | Admitting: Neurological Surgery

## 2022-05-28 ENCOUNTER — Encounter (HOSPITAL_COMMUNITY): Payer: Self-pay

## 2022-05-28 VITALS — BP 152/77 | HR 57 | Temp 97.5°F | Resp 18 | Ht 68.5 in | Wt 250.3 lb

## 2022-05-28 DIAGNOSIS — Z01812 Encounter for preprocedural laboratory examination: Secondary | ICD-10-CM | POA: Insufficient documentation

## 2022-05-28 DIAGNOSIS — Z01818 Encounter for other preprocedural examination: Secondary | ICD-10-CM

## 2022-05-28 LAB — TYPE AND SCREEN
ABO/RH(D): O POS
Antibody Screen: NEGATIVE

## 2022-05-28 LAB — SURGICAL PCR SCREEN
MRSA, PCR: NEGATIVE
Staphylococcus aureus: NEGATIVE

## 2022-05-28 NOTE — Progress Notes (Signed)
PCP - Daphane Shepherd PA with [facility]  Cardiologist - Denies  PPM/ICD - Denies  Chest x-ray - NI EKG - 05/14/22 Stress Test - Denies ECHO - Denies Cardiac Cath - Denies  Sleep Study - No  DM - Denies  Blood Thinner Instructions:Denies Aspirin Instructions:Per patient will stop aspirin and molexicam tomorrow, last dose 05/28/22  ERAS Protcol -No  COVID TEST- NI   Anesthesia review: No  Patient denies shortness of breath, fever, cough and chest pain at PAT appointment   All instructions explained to the patient, with a verbal understanding of the material. Patient agrees to go over the instructions while at home for a better understanding.  The opportunity to ask questions was provided.

## 2022-05-28 NOTE — Progress Notes (Signed)
   05/28/22 1410  OBSTRUCTIVE SLEEP APNEA  Have you ever been diagnosed with sleep apnea through a sleep study? No  Do you snore loudly (loud enough to be heard through closed doors)?  1  Do you often feel tired, fatigued, or sleepy during the daytime (such as falling asleep during driving or talking to someone)? 0  Has anyone observed you stop breathing during your sleep? 0  Do you have, or are you being treated for high blood pressure? 1  BMI more than 35 kg/m2? 1  Age > 50 (1-yes) 1  Neck circumference greater than:Male 16 inches or larger, Male 17inches or larger? 1  Male Gender (Yes=1) 1  Obstructive Sleep Apnea Score 6  Score 5 or greater  Results sent to PCP

## 2022-06-04 ENCOUNTER — Other Ambulatory Visit (HOSPITAL_COMMUNITY): Payer: Self-pay

## 2022-06-05 ENCOUNTER — Ambulatory Visit (HOSPITAL_COMMUNITY): Payer: 59 | Admitting: Certified Registered Nurse Anesthetist

## 2022-06-05 ENCOUNTER — Ambulatory Visit (HOSPITAL_COMMUNITY): Payer: 59

## 2022-06-05 ENCOUNTER — Encounter (HOSPITAL_COMMUNITY): Payer: Self-pay | Admitting: Neurological Surgery

## 2022-06-05 ENCOUNTER — Observation Stay (HOSPITAL_COMMUNITY)
Admission: RE | Admit: 2022-06-05 | Discharge: 2022-06-06 | Disposition: A | Discharge status: Home / Self Care | Payer: 59 | Attending: Neurological Surgery | Admitting: Neurological Surgery

## 2022-06-05 ENCOUNTER — Other Ambulatory Visit: Payer: Self-pay

## 2022-06-05 ENCOUNTER — Encounter (HOSPITAL_COMMUNITY)
Admission: RE | Disposition: A | Discharge status: Home / Self Care | Payer: Self-pay | Source: Home / Self Care | Attending: Neurological Surgery

## 2022-06-05 ENCOUNTER — Ambulatory Visit (HOSPITAL_BASED_OUTPATIENT_CLINIC_OR_DEPARTMENT_OTHER): Payer: 59 | Admitting: Certified Registered Nurse Anesthetist

## 2022-06-05 DIAGNOSIS — Z7951 Long term (current) use of inhaled steroids: Secondary | ICD-10-CM | POA: Insufficient documentation

## 2022-06-05 DIAGNOSIS — M4316 Spondylolisthesis, lumbar region: Secondary | ICD-10-CM

## 2022-06-05 DIAGNOSIS — Z7982 Long term (current) use of aspirin: Secondary | ICD-10-CM | POA: Insufficient documentation

## 2022-06-05 DIAGNOSIS — Z79899 Other long term (current) drug therapy: Secondary | ICD-10-CM | POA: Diagnosis not present

## 2022-06-05 DIAGNOSIS — I10 Essential (primary) hypertension: Secondary | ICD-10-CM | POA: Diagnosis not present

## 2022-06-05 DIAGNOSIS — Z981 Arthrodesis status: Secondary | ICD-10-CM | POA: Diagnosis not present

## 2022-06-05 DIAGNOSIS — M4726 Other spondylosis with radiculopathy, lumbar region: Secondary | ICD-10-CM | POA: Insufficient documentation

## 2022-06-05 DIAGNOSIS — M48062 Spinal stenosis, lumbar region with neurogenic claudication: Secondary | ICD-10-CM | POA: Diagnosis not present

## 2022-06-05 DIAGNOSIS — Z87891 Personal history of nicotine dependence: Secondary | ICD-10-CM | POA: Insufficient documentation

## 2022-06-05 HISTORY — PX: TRANSFORAMINAL LUMBAR INTERBODY FUSION W/ MIS 2 LEVEL: SHX6146

## 2022-06-05 LAB — ABO/RH: ABO/RH(D): O POS

## 2022-06-05 SURGERY — MINIMALLY INVASIVE (MIS) TRANSFORAMINAL LUMBAR INTERBODY FUSION (TLIF) 2 LEVEL
Anesthesia: General | Laterality: Right

## 2022-06-05 MED ORDER — ACETAMINOPHEN 325 MG PO TABS: 650.0000 mg | ORAL_TABLET | ORAL | Status: DC | PRN

## 2022-06-05 MED ORDER — PANTOPRAZOLE SODIUM 40 MG PO TBEC
80.0000 mg | DELAYED_RELEASE_TABLET | Freq: Every day | ORAL | Status: DC
  Administered 2022-06-05: 80 mg via ORAL
  Filled 2022-06-05: qty 2

## 2022-06-05 MED ORDER — CHLORHEXIDINE GLUCONATE CLOTH 2 % EX PADS: 6.0000 | MEDICATED_PAD | Freq: Once | CUTANEOUS | Status: DC

## 2022-06-05 MED ORDER — DEXAMETHASONE SODIUM PHOSPHATE 10 MG/ML IJ SOLN
INTRAMUSCULAR | Status: DC | PRN
  Administered 2022-06-05: 10 mg via INTRAVENOUS

## 2022-06-05 MED ORDER — LISINOPRIL 20 MG PO TABS
20.0000 mg | ORAL_TABLET | Freq: Every day | ORAL | Status: DC
  Administered 2022-06-05: 20 mg via ORAL
  Filled 2022-06-05 (×2): qty 1

## 2022-06-05 MED ORDER — PHENOL 1.4 % MT LIQD: 1.0000 | OROMUCOSAL | Status: DC | PRN

## 2022-06-05 MED ORDER — LIDOCAINE-EPINEPHRINE 1 %-1:100000 IJ SOLN
INTRAMUSCULAR | Status: AC
  Filled 2022-06-05: qty 1

## 2022-06-05 MED ORDER — FENTANYL CITRATE (PF) 250 MCG/5ML IJ SOLN
INTRAMUSCULAR | Status: AC
  Filled 2022-06-05: qty 5

## 2022-06-05 MED ORDER — PROPOFOL 10 MG/ML IV BOLUS
INTRAVENOUS | Status: DC | PRN
  Administered 2022-06-05: 150 mg via INTRAVENOUS

## 2022-06-05 MED ORDER — FENTANYL CITRATE (PF) 100 MCG/2ML IJ SOLN: 25.0000 ug | INTRAMUSCULAR | Status: DC | PRN

## 2022-06-05 MED ORDER — DOCUSATE SODIUM 100 MG PO CAPS
100.0000 mg | ORAL_CAPSULE | Freq: Two times a day (BID) | ORAL | Status: DC
  Administered 2022-06-05 – 2022-06-06 (×2): 100 mg via ORAL
  Filled 2022-06-05 (×2): qty 1

## 2022-06-05 MED ORDER — THROMBIN 5000 UNITS EX SOLR
CUTANEOUS | Status: AC
  Filled 2022-06-05: qty 5000

## 2022-06-05 MED ORDER — FENTANYL CITRATE (PF) 250 MCG/5ML IJ SOLN
INTRAMUSCULAR | Status: DC | PRN
  Administered 2022-06-05: 100 ug via INTRAVENOUS
  Administered 2022-06-05 (×4): 50 ug via INTRAVENOUS

## 2022-06-05 MED ORDER — ACETAMINOPHEN 650 MG RE SUPP: 650.0000 mg | RECTAL | Status: DC | PRN

## 2022-06-05 MED ORDER — 0.9 % SODIUM CHLORIDE (POUR BTL) OPTIME
TOPICAL | Status: DC | PRN
  Administered 2022-06-05: 1000 mL

## 2022-06-05 MED ORDER — PROPOFOL 500 MG/50ML IV EMUL
INTRAVENOUS | Status: DC | PRN
  Administered 2022-06-05: 60 ug/kg/min via INTRAVENOUS

## 2022-06-05 MED ORDER — EPHEDRINE SULFATE-NACL 50-0.9 MG/10ML-% IV SOSY
PREFILLED_SYRINGE | INTRAVENOUS | Status: DC | PRN
  Administered 2022-06-05: 10 mg via INTRAVENOUS

## 2022-06-05 MED ORDER — OXYCODONE HCL 5 MG PO TABS: 5.0000 mg | ORAL_TABLET | ORAL | Status: DC | PRN

## 2022-06-05 MED ORDER — SODIUM CHLORIDE 0.9% FLUSH: 3.0000 mL | Freq: Two times a day (BID) | INTRAVENOUS | Status: DC

## 2022-06-05 MED ORDER — THROMBIN 5000 UNITS EX SOLR
OROMUCOSAL | Status: DC | PRN
  Administered 2022-06-05 (×2): 5 mL via TOPICAL

## 2022-06-05 MED ORDER — AMISULPRIDE (ANTIEMETIC) 5 MG/2ML IV SOLN: 10.0000 mg | Freq: Once | INTRAVENOUS | Status: DC | PRN

## 2022-06-05 MED ORDER — MIDAZOLAM HCL 2 MG/2ML IJ SOLN
INTRAMUSCULAR | Status: AC
  Filled 2022-06-05: qty 2

## 2022-06-05 MED ORDER — CEFAZOLIN SODIUM-DEXTROSE 2-4 GM/100ML-% IV SOLN
2.0000 g | Freq: Three times a day (TID) | INTRAVENOUS | Status: AC
  Administered 2022-06-05 – 2022-06-06 (×2): 2 g via INTRAVENOUS
  Filled 2022-06-05 (×2): qty 100

## 2022-06-05 MED ORDER — ROCURONIUM BROMIDE 10 MG/ML (PF) SYRINGE
PREFILLED_SYRINGE | INTRAVENOUS | Status: DC | PRN
  Administered 2022-06-05: 30 mg via INTRAVENOUS
  Administered 2022-06-05: 70 mg via INTRAVENOUS
  Administered 2022-06-05: 20 mg via INTRAVENOUS

## 2022-06-05 MED ORDER — SODIUM CHLORIDE 0.9 % IV SOLN: 250.0000 mL | INTRAVENOUS | Status: DC

## 2022-06-05 MED ORDER — ONDANSETRON HCL 4 MG PO TABS: 4.0000 mg | ORAL_TABLET | Freq: Four times a day (QID) | ORAL | Status: DC | PRN

## 2022-06-05 MED ORDER — BUPIVACAINE-EPINEPHRINE (PF) 0.5% -1:200000 IJ SOLN
INTRAMUSCULAR | Status: AC
  Filled 2022-06-05: qty 30

## 2022-06-05 MED ORDER — CEFAZOLIN SODIUM-DEXTROSE 2-4 GM/100ML-% IV SOLN
2.0000 g | INTRAVENOUS | Status: AC
  Administered 2022-06-05: 2 g via INTRAVENOUS
  Filled 2022-06-05: qty 100

## 2022-06-05 MED ORDER — ORAL CARE MOUTH RINSE: 15.0000 mL | Freq: Once | OROMUCOSAL | Status: AC

## 2022-06-05 MED ORDER — PHENYLEPHRINE HCL-NACL 20-0.9 MG/250ML-% IV SOLN
INTRAVENOUS | Status: DC | PRN
  Administered 2022-06-05: 50 ug/min via INTRAVENOUS

## 2022-06-05 MED ORDER — CHLORHEXIDINE GLUCONATE 0.12 % MT SOLN
OROMUCOSAL | Status: AC
  Filled 2022-06-05: qty 15

## 2022-06-05 MED ORDER — KETOROLAC TROMETHAMINE 15 MG/ML IJ SOLN
7.5000 mg | Freq: Four times a day (QID) | INTRAMUSCULAR | Status: DC
  Administered 2022-06-05 – 2022-06-06 (×3): 7.5 mg via INTRAVENOUS
  Filled 2022-06-05 (×3): qty 1

## 2022-06-05 MED ORDER — ROSUVASTATIN CALCIUM 5 MG PO TABS
10.0000 mg | ORAL_TABLET | Freq: Every day | ORAL | Status: DC
  Administered 2022-06-05: 10 mg via ORAL
  Filled 2022-06-05 (×2): qty 2

## 2022-06-05 MED ORDER — SODIUM CHLORIDE 0.9% FLUSH: 3.0000 mL | INTRAVENOUS | Status: DC | PRN

## 2022-06-05 MED ORDER — ACETAMINOPHEN 10 MG/ML IV SOLN
INTRAVENOUS | Status: DC | PRN
  Administered 2022-06-05: 1000 mg via INTRAVENOUS

## 2022-06-05 MED ORDER — BUPIVACAINE LIPOSOME 1.3 % IJ SUSP
INTRAMUSCULAR | Status: AC
  Filled 2022-06-05: qty 20

## 2022-06-05 MED ORDER — HEMOSTATIC AGENTS (NO CHARGE) OPTIME
TOPICAL | Status: DC | PRN
  Administered 2022-06-05: 1 via TOPICAL

## 2022-06-05 MED ORDER — METHOCARBAMOL 500 MG PO TABS
500.0000 mg | ORAL_TABLET | Freq: Four times a day (QID) | ORAL | Status: DC | PRN
  Administered 2022-06-05 – 2022-06-06 (×3): 500 mg via ORAL
  Filled 2022-06-05 (×3): qty 1

## 2022-06-05 MED ORDER — ONDANSETRON HCL 4 MG/2ML IJ SOLN: 4.0000 mg | Freq: Four times a day (QID) | INTRAMUSCULAR | Status: DC | PRN

## 2022-06-05 MED ORDER — LISINOPRIL-HYDROCHLOROTHIAZIDE 20-25 MG PO TABS: 1.0000 | ORAL_TABLET | Freq: Every day | ORAL | Status: DC

## 2022-06-05 MED ORDER — METHOCARBAMOL 1000 MG/10ML IJ SOLN: 500.0000 mg | Freq: Four times a day (QID) | INTRAVENOUS | Status: DC | PRN

## 2022-06-05 MED ORDER — LIDOCAINE 2% (20 MG/ML) 5 ML SYRINGE
INTRAMUSCULAR | Status: AC
  Filled 2022-06-05: qty 15

## 2022-06-05 MED ORDER — ONDANSETRON HCL 4 MG/2ML IJ SOLN
INTRAMUSCULAR | Status: AC
  Filled 2022-06-05: qty 4

## 2022-06-05 MED ORDER — ACETAMINOPHEN 10 MG/ML IV SOLN
INTRAVENOUS | Status: AC
  Filled 2022-06-05: qty 100

## 2022-06-05 MED ORDER — MIDAZOLAM HCL 2 MG/2ML IJ SOLN
INTRAMUSCULAR | Status: DC | PRN
  Administered 2022-06-05: 2 mg via INTRAVENOUS

## 2022-06-05 MED ORDER — HYDROCHLOROTHIAZIDE 25 MG PO TABS
25.0000 mg | ORAL_TABLET | Freq: Every day | ORAL | Status: DC
  Administered 2022-06-05: 25 mg via ORAL
  Filled 2022-06-05 (×2): qty 1

## 2022-06-05 MED ORDER — LIDOCAINE-EPINEPHRINE 1 %-1:100000 IJ SOLN
INTRAMUSCULAR | Status: DC | PRN
  Administered 2022-06-05: 10 mL

## 2022-06-05 MED ORDER — ROCURONIUM BROMIDE 10 MG/ML (PF) SYRINGE
PREFILLED_SYRINGE | INTRAVENOUS | Status: AC
  Filled 2022-06-05: qty 40

## 2022-06-05 MED ORDER — ONDANSETRON HCL 4 MG/2ML IJ SOLN
INTRAMUSCULAR | Status: AC
  Filled 2022-06-05: qty 2

## 2022-06-05 MED ORDER — DEXAMETHASONE SODIUM PHOSPHATE 10 MG/ML IJ SOLN
INTRAMUSCULAR | Status: AC
  Filled 2022-06-05: qty 3

## 2022-06-05 MED ORDER — ONDANSETRON HCL 4 MG/2ML IJ SOLN
INTRAMUSCULAR | Status: DC | PRN
  Administered 2022-06-05 (×2): 4 mg via INTRAVENOUS

## 2022-06-05 MED ORDER — BUPIVACAINE LIPOSOME 1.3 % IJ SUSP
INTRAMUSCULAR | Status: DC | PRN
  Administered 2022-06-05: 20 mL

## 2022-06-05 MED ORDER — BUPIVACAINE-EPINEPHRINE (PF) 0.5% -1:200000 IJ SOLN
INTRAMUSCULAR | Status: DC | PRN
  Administered 2022-06-05: 10 mL via PERINEURAL

## 2022-06-05 MED ORDER — ACETAMINOPHEN 500 MG PO TABS
1000.0000 mg | ORAL_TABLET | Freq: Once | ORAL | Status: AC
  Administered 2022-06-06: 1000 mg via ORAL
  Filled 2022-06-05: qty 2

## 2022-06-05 MED ORDER — MENTHOL 3 MG MT LOZG: 1.0000 | LOZENGE | OROMUCOSAL | Status: DC | PRN

## 2022-06-05 MED ORDER — OXYCODONE HCL 5 MG PO TABS
10.0000 mg | ORAL_TABLET | ORAL | Status: DC | PRN
  Administered 2022-06-05: 10 mg via ORAL
  Filled 2022-06-05: qty 2

## 2022-06-05 MED ORDER — LACTATED RINGERS IV SOLN: INTRAVENOUS | Status: DC

## 2022-06-05 MED ORDER — CHLORHEXIDINE GLUCONATE 0.12 % MT SOLN
15.0000 mL | Freq: Once | OROMUCOSAL | Status: AC
  Administered 2022-06-05: 15 mL via OROMUCOSAL

## 2022-06-05 MED ORDER — LIDOCAINE 2% (20 MG/ML) 5 ML SYRINGE
INTRAMUSCULAR | Status: DC | PRN
  Administered 2022-06-05: 60 mg via INTRAVENOUS

## 2022-06-05 MED ORDER — HYDROMORPHONE HCL 1 MG/ML IJ SOLN: 1.0000 mg | INTRAMUSCULAR | Status: DC | PRN

## 2022-06-05 SURGICAL SUPPLY — 81 items
ADH SKN CLS APL DERMABOND .7 (GAUZE/BANDAGES/DRESSINGS) ×2
BAG COUNTER SPONGE SURGICOUNT (BAG) ×1 IMPLANT
BAG SPNG CNTER NS LX DISP (BAG) ×1
BAND INSRT 18 STRL LF DISP RB (MISCELLANEOUS) ×2
BAND RUBBER #18 3X1/16 STRL (MISCELLANEOUS) ×2 IMPLANT
BASKET BONE COLLECTION (BASKET) ×1 IMPLANT
BUR MATCHSTICK NEURO 3.0 LAGG (BURR) ×1 IMPLANT
CAGE POR CASCADIA 8.5X28X12 (Cage) IMPLANT
CNTNR URN SCR LID CUP LEK RST (MISCELLANEOUS) ×1 IMPLANT
CONT SPEC 4OZ STRL OR WHT (MISCELLANEOUS) ×1
COVER BACK TABLE 60X90IN (DRAPES) ×1 IMPLANT
COVER MAYO STAND STRL (DRAPES) ×2 IMPLANT
DERMABOND ADVANCED .7 DNX12 (GAUZE/BANDAGES/DRESSINGS) IMPLANT
DRAIN JACKSON RD 7FR 3/32 (WOUND CARE) IMPLANT
DRAPE C-ARM 42X72 X-RAY (DRAPES) ×2 IMPLANT
DRAPE C-ARMOR (DRAPES) IMPLANT
DRAPE LAPAROTOMY 100X72X124 (DRAPES) ×1 IMPLANT
DRAPE MICROSCOPE SLANT 54X150 (MISCELLANEOUS) ×1 IMPLANT
DRAPE STERI IOBAN 125X83 (DRAPES) IMPLANT
DRAPE SURG 17X23 STRL (DRAPES) ×1 IMPLANT
DRSG OPSITE POSTOP 4X6 (GAUZE/BANDAGES/DRESSINGS) IMPLANT
DURAPREP 26ML APPLICATOR (WOUND CARE) ×1 IMPLANT
ELECT BLADE INSULATED 6.5IN (ELECTROSURGICAL) ×1
ELECT REM PT RETURN 9FT ADLT (ELECTROSURGICAL) ×1
ELECTRODE BLDE INSULATED 6.5IN (ELECTROSURGICAL) ×1 IMPLANT
ELECTRODE REM PT RTRN 9FT ADLT (ELECTROSURGICAL) ×1 IMPLANT
GAUZE 4X4 16PLY ~~LOC~~+RFID DBL (SPONGE) IMPLANT
GAUZE SPONGE 4X4 12PLY STRL (GAUZE/BANDAGES/DRESSINGS) ×1 IMPLANT
GLOVE BIOGEL PI IND STRL 8 (GLOVE) ×2 IMPLANT
GLOVE ECLIPSE 8.0 STRL XLNG CF (GLOVE) ×2 IMPLANT
GLOVE SURG ENC MOIS LTX SZ8 (GLOVE) ×1 IMPLANT
GLOVE SURG UNDER POLY LF SZ8.5 (GLOVE) ×1 IMPLANT
GOWN STRL REUS W/ TWL LRG LVL3 (GOWN DISPOSABLE) IMPLANT
GOWN STRL REUS W/ TWL XL LVL3 (GOWN DISPOSABLE) ×2 IMPLANT
GOWN STRL REUS W/TWL 2XL LVL3 (GOWN DISPOSABLE) IMPLANT
GOWN STRL REUS W/TWL LRG LVL3 (GOWN DISPOSABLE)
GOWN STRL REUS W/TWL XL LVL3 (GOWN DISPOSABLE) ×2
GRAFT BONE PROTEIOS SM 1CC (Orthopedic Implant) IMPLANT
GUIDEWIRE EVEREST 1.4X620 2-PK (WIRE) IMPLANT
HEMOSTAT POWDER KIT SURGIFOAM (HEMOSTASIS) ×1 IMPLANT
KIT BASIN OR (CUSTOM PROCEDURE TRAY) ×1 IMPLANT
KIT POSITION SURG JACKSON T1 (MISCELLANEOUS) ×1 IMPLANT
KIT TURNOVER KIT B (KITS) ×1 IMPLANT
MARKER SKIN DUAL TIP RULER LAB (MISCELLANEOUS) ×1 IMPLANT
NDL BIOPSY DD SERENGETI 8G (NEEDLE) IMPLANT
NDL HYPO 21X1.5 SAFETY (NEEDLE) ×1 IMPLANT
NDL HYPO 25X1 1.5 SAFETY (NEEDLE) ×1 IMPLANT
NDL SPNL 22GX3.5 QUINCKE BK (NEEDLE) ×1 IMPLANT
NEEDLE BIOPSY DD SERENGETI 8G (NEEDLE) ×1 IMPLANT
NEEDLE HYPO 21X1.5 SAFETY (NEEDLE) ×1 IMPLANT
NEEDLE HYPO 25X1 1.5 SAFETY (NEEDLE) ×1 IMPLANT
NEEDLE SPNL 22GX3.5 QUINCKE BK (NEEDLE) ×1 IMPLANT
NS IRRIG 1000ML POUR BTL (IV SOLUTION) ×1 IMPLANT
PACK LAMINECTOMY NEURO (CUSTOM PROCEDURE TRAY) ×1 IMPLANT
PAD ARMBOARD 7.5X6 YLW CONV (MISCELLANEOUS) ×3 IMPLANT
PATTIES SURGICAL .5 X.5 (GAUZE/BANDAGES/DRESSINGS) IMPLANT
PATTIES SURGICAL .5 X1 (DISPOSABLE) IMPLANT
PATTIES SURGICAL 1X1 (DISPOSABLE) IMPLANT
PUTTY BONE 100 VESUVIUS 1CC (Putty) IMPLANT
PUTTY BONE 100 VESUVIUS 2.5CC (Putty) IMPLANT
ROD CONT BN 5.5X70 (Rod) IMPLANT
ROD CONT BN EVEREST 5.5X75 (Rod) IMPLANT
SCREW CANN EVEREST 7.5X50 (Screw) IMPLANT
SET SCREW (Screw) ×6 IMPLANT
SET SCREW VRST (Screw) IMPLANT
SPIKE FLUID TRANSFER (MISCELLANEOUS) ×1 IMPLANT
SPONGE SURGIFOAM ABS GEL 100 (HEMOSTASIS) IMPLANT
SPONGE SURGIFOAM ABS GEL SZ50 (HEMOSTASIS) ×1 IMPLANT
SPONGE T-LAP 4X18 ~~LOC~~+RFID (SPONGE) IMPLANT
STAPLER VISISTAT 35W (STAPLE) IMPLANT
SUT VIC AB 0 CT1 18XCR BRD8 (SUTURE) ×1 IMPLANT
SUT VIC AB 0 CT1 8-18 (SUTURE) ×1
SUT VIC AB 2-0 CP2 18 (SUTURE) ×1 IMPLANT
SUT VIC AB 3-0 SH 8-18 (SUTURE) ×1 IMPLANT
SYR 20CC LL (SYRINGE) ×1 IMPLANT
SYR 20ML LL LF (SYRINGE) IMPLANT
SYR 5ML LL (SYRINGE) IMPLANT
TOWEL GREEN STERILE (TOWEL DISPOSABLE) ×1 IMPLANT
TOWEL GREEN STERILE FF (TOWEL DISPOSABLE) ×1 IMPLANT
TRAY FOLEY MTR SLVR 16FR STAT (SET/KITS/TRAYS/PACK) ×1 IMPLANT
WATER STERILE IRR 1000ML POUR (IV SOLUTION) ×1 IMPLANT

## 2022-06-05 NOTE — H&P (Signed)
Providing Compassionate, Quality Care - Together  NEUROSURGERY HISTORY & PHYSICAL   Charles [name] is an 66 y.o. male.   Chief Complaint: BLE radic, claudication HPI: This is a 66 year old male with a history of chronic worsening low back pain and bilateral lower extremity claudication and radiculopathy.  This has been progressively worsening for greater than 6 months.  Imaging revealed moderate to severe degenerative disc disease, ligamentum and facet hypertrophy at L3-4 and L4-5 with severe stenosis this has been overall ongoing for greater than 2 years but certainly been worsening significantly over the past few months.  He cannot tolerate standing for more than a few minutes due to his significant pain.  He gets significant relief whenever he sits down.  There is also a mobile spondylolisthesis on x-rays at L4-5 that appears degenerative in nature.  He presents today for surgical intervention after failure of conservative measures.  He was not interested in epidural steroid injections whatsoever.  Past Medical History:  Diagnosis Date   Allergy    seasonal   Arthritis    CLL (chronic lymphoblastic leukemia) 08/03/2011   CLL (chronic lymphocytic leukemia) ([place]) 03/18/2012   GERD (gastroesophageal reflux disease)    Hyperlipidemia    Hypertension    Shingles    Ulcer     Past Surgical History:  Procedure Laterality Date   HERNIA REPAIR     KNEE ARTHROSCOPY WITH LATERAL MENISECTOMY Left 12/24/2019   Procedure: KNEE ARTHROSCOPY WITH PARTIAL LATERAL MENISECTOMY;  Surgeon: Mcarthur Rossetti, MD;  Location: [facility];  Service: Orthopedics;  Laterality: Left;   KNEE SURGERY Right    SHOULDER SURGERY Left    WRIST SURGERY Right     Family History  Problem Relation Age of Onset   Diabetes Mother    Hyperlipidemia Father    Cancer Sister    Breast cancer Sister    Melanoma Sister    Cancer Brother 2       prostate cancer stage 4   Cancer Brother 63        prostate   Cancer Brother    Cancer - Colon Brother    Colon cancer Brother    Neuropathy Neg Hx    Social History:  reports that he quit smoking about 23 years ago. His smoking use included cigarettes. He started smoking about 43 years ago. He has a 40.00 pack-year smoking history. He has never used smokeless tobacco. He reports current alcohol use. He reports that he does not use drugs.  Allergies: No Known Allergies  Medications Prior to Admission  Medication Sig Dispense Refill   acetaminophen (TYLENOL) 500 MG tablet Take 1,000-1,500 mg by mouth daily.     Alpha-Lipoic Acid 600 MG CAPS Take 600 mg by mouth 2 (two) times daily.     aspirin 81 MG tablet Take 81 mg by mouth daily.     cetirizine (ZYRTEC) 10 MG tablet Take 10 mg by mouth daily.     Cholecalciferol (VITAMIN D3) 2000 UNITS TABS Take 2,000 Units by mouth.     esomeprazole (NEXIUM) 40 MG capsule Take 1 capsule (40 mg total) by mouth daily. 90 capsule 3   ketoconazole (NIZORAL) 2 % cream Apply topically 2 (two) times daily. (Patient taking differently: Apply 1 Application topically daily as needed (Itching).) 60 g 3   lisinopril-hydrochlorothiazide (ZESTORETIC) 20-25 MG tablet Take 1 tablet by mouth daily. 90 tablet 1   meloxicam (MOBIC) 15 MG tablet Take 1 tablet (15 mg total)  by mouth daily. 90 tablet 0   rosuvastatin (CRESTOR) 10 MG tablet Take 1 tablet (10 mg total) by mouth daily. 90 tablet 1   fluticasone (FLONASE) 50 MCG/ACT nasal spray Place 1 spray into both nostrils daily. (Patient taking differently: Place 1 spray into both nostrils daily as needed for rhinitis.) 48 g 3   HYDROcodone-acetaminophen (NORCO/VICODIN) 5-325 MG tablet Take 1 tablet by mouth every 4-6 hours as needed for pain 8 tablet 0    Results for orders placed or performed during the hospital encounter of 06/05/22 (from the past 48 hour(s))  ABO/Rh     Status: None   Collection Time: 06/05/22  8:59 AM  Result Value Ref Range   ABO/RH(D)       O POS Performed at [facility] [address], [place], [place] [address]    No results found.  ROS All pertinent positives and negatives are listed in HPI above  Blood pressure 131/74, pulse 64, temperature 97.6 F (36.4 C), temperature source Oral, resp. rate 17, height 5' 8.5" (1.74 m), weight 108.9 kg, SpO2 96 %. Physical Exam  Awake alert oriented x3, no acute distress PERRLA Cranial nerves II through XII intact Bilateral upper extremities 5/5 throughout Bilateral lower extremities 4+/5 throughout Deep tendon reflexes 2/4 throughout  Assessment/Plan 66 year old male with  L3-4, L4-5 degenerative spondylosis, L4-5 spondylolisthesis, with severe stenosis, radiculopathy and neurogenic claudication   -OR today for L3-4, L4-5 minimally invasive decompression, transforaminal lumbar interbody fusion.  We discussed all risks, benefits and expected outcomes as well as alternatives to treatment.  I answered all of his questions as well as his wife's questions.  Informed consent was obtained and witnessed.  Thank you for allowing me to participate in this patient's care.  Please do not hesitate to call with questions or concerns.   Elwin Sleight, [facility] Cell: [phone]

## 2022-06-05 NOTE — Progress Notes (Signed)
Attempted to call report, 3C unable to take report at this time.

## 2022-06-05 NOTE — Anesthesia Preprocedure Evaluation (Addendum)
Anesthesia Evaluation  Patient identified by MRN, date of birth, ID band Patient awake    Reviewed: Allergy & Precautions, NPO status , Patient's Chart, lab work & pertinent test results  Airway Mallampati: II  TM Distance: >3 FB Neck ROM: Full    Dental  (+) Dental Advisory Given   Pulmonary former smoker   breath sounds clear to auscultation       Cardiovascular hypertension, Pt. on medications  Rhythm:Regular Rate:Normal     Neuro/Psych negative neurological ROS     GI/Hepatic Neg liver ROS,GERD  ,,  Endo/Other  negative endocrine ROS    Renal/GU negative Renal ROS     Musculoskeletal  (+) Arthritis ,    Abdominal   Peds  Hematology negative hematology ROS (+)   Anesthesia Other Findings   Reproductive/Obstetrics                              Lab Results  Component Value Date   WBC 10.6 (H) 08/21/2021   HGB 15.6 08/21/2021   HCT 46.0 08/21/2021   MCV 94.5 08/21/2021   PLT 244 08/21/2021   Lab Results  Component Value Date   CREATININE 1.18 08/21/2021   BUN 19 08/21/2021   NA 138 08/21/2021   K 4.4 08/21/2021   CL 99 08/21/2021   CO2 31 08/21/2021    Anesthesia Physical Anesthesia Plan  ASA: 2  Anesthesia Plan: General   Post-op Pain Management: Tylenol PO (pre-op)*   Induction: Intravenous  PONV Risk Score and Plan: 2 and Dexamethasone, Ondansetron and Treatment may vary due to age or medical condition  Airway Management Planned: Oral ETT  Additional Equipment: Arterial line  Intra-op Plan:   Post-operative Plan: Extubation in OR  Informed Consent: I have reviewed the patients History and Physical, chart, labs and discussed the procedure including the risks, benefits and alternatives for the proposed anesthesia with the patient or authorized representative who has indicated his/her understanding and acceptance.     Dental advisory given  Plan Discussed  with: CRNA  Anesthesia Plan Comments:          Anesthesia Quick Evaluation

## 2022-06-05 NOTE — Anesthesia Procedure Notes (Addendum)
Arterial Line Insertion Start/End11/14/2023 10:30 AM, 06/05/2022 10:35 AM Performed by: Suzette Battiest, MD, Minerva Ends, CRNA, CRNA  Patient location: Pre-op. Preanesthetic checklist: patient identified, IV checked, site marked, risks and benefits discussed, surgical consent, monitors and equipment checked, pre-op evaluation, timeout performed and anesthesia consent Lidocaine 1% used for infiltration Right, radial was placed Catheter size: 20 G Hand hygiene performed  and maximum sterile barriers used   Attempts: 1 Procedure performed without using ultrasound guided technique. Following insertion, dressing applied and Biopatch. Post procedure assessment: normal and unchanged  Patient tolerated the procedure well with no immediate complications.

## 2022-06-05 NOTE — Anesthesia Procedure Notes (Addendum)
Procedure Name: Intubation Date/Time: 06/05/2022 11:10 AM  Performed by: Minerva Ends, CRNAPre-anesthesia Checklist: Patient identified, Emergency Drugs available, Suction available and Patient being monitored Patient Re-evaluated:Patient Re-evaluated prior to induction Oxygen Delivery Method: Circle system utilized Preoxygenation: Pre-oxygenation with 100% oxygen Induction Type: IV induction Ventilation: Mask ventilation without difficulty Laryngoscope Size: Mac and 4 Grade View: Grade I Tube type: Oral Tube size: 7.0 mm Number of attempts: 1 Airway Equipment and Method: Stylet and Oral airway Placement Confirmation: ETT inserted through vocal cords under direct vision, positive ETCO2 and breath sounds checked- equal and bilateral Secured at: 23 cm Tube secured with: Tape Dental Injury: Teeth and Oropharynx as per pre-operative assessment

## 2022-06-05 NOTE — Op Note (Signed)
Providing Compassionate, Quality Care - Together  Date of service: 06/05/2022  PREOP DIAGNOSIS:  L3-4 lumbar spondylosis, stenosis; L4-5 degenerative spondylolisthesis, mobile with severe stenosis and radiculopathy and claudication   POSTOP DIAGNOSIS: Same   PROCEDURE: Minimally invasive L 3-4, L4-5 decompression and transforaminal lumbar interbody fusion and arthrodesis, right sided approach Bilateral L3, L4, L5 segmental pedicle screw instrumentation, K2 M [place] L3: 7.5 x 50 mm bilaterally, L4: 7.5 x 50 mm bilaterally, L5: 7.5 x 50 mm bilaterally Placement of anterior biomechanical device, L3-4: 12 x 28 mm titanium K2 M [place] interbody; L4-5: 12 x 28 mm titanium [place] interbody Intraoperative use of autograft, same incision Intraoperative use of allograft  Intraoperative use of fluoroscopy, greater than 1 hour Intraoperative use of microscope, for microdissection   SURGEON: Dr. Pieter Partridge C. Caitlin Hillmer, DO   ASSISTANT: Dr. Duffy Rhody, MD   ANESTHESIA: General Endotracheal   EBL: 150 cc   SPECIMENS: None   DRAINS: None   COMPLICATIONS: None   CONDITION: Hemodynamically stable   HISTORY: Charles [name] is a 66 y.o. y.o. male who initially presented to the outpatient clinic with signs and symptoms consistent with neurogenic claudication and bilateral lower extremity radiculopathy. MRI demonstrated degenerative spondylosis with moderate stenosis at L3-4, with degenerative disc disease, ligamentum and facet hypertrophy; grade 1 anterior listhesis that appeared degenerative at L4-5 with severe stenosis and bilateral lateral recess stenosis.  X-ray revealed mobile spondylolisthesis at L4-5 in flexion and extension imaging. Treatment options were discussed including epidural steroid injections, pain control, physical therapy, he was not interested in any physical therapy or epidural steroid injections.  He failed pain control.  His pain was intractable and altering his daily  lifestyle therefore I offered him an L3-4, L4-5 minimally invasive lumbar decompression, transforaminal lumbar interbody fusion. All risks, benefits and expected outcomes were discussed agreed upon.  Informed consent was obtained.   PROCEDURE IN DETAIL: The patient was brought to the operating room. After induction of general anesthesia, the patient was positioned on the operative table in the prone position on the Slovan table. All pressure points were meticulously padded. Skin incision was then marked out and prepped and draped in the usual sterile fashion. Physician driven timeout was performed.   Using biplanar fluoroscopy, the C arm for sterilely draped and brought into the field and the paramedian incisions were planned over the L3-4, L4-5 interspace.  Local anesthetic was injected bilaterally.  Using a 10 blade, paramedian incision was created bilaterally.  Using Jamshidi, the bilateral L3 pedicles were accessed under biplanar fluoroscopy.  K wires were placed and the Jamshidi's were removed.  This was repeated bilaterally at L4, L5.  Attention was then turned to docking the Metrix dilator.  Using with a series of dilators, on the patient's right side the facet lamina junction was docked with a 26 x 8 mm tube at L4-5.  This was confirmed to be in the appropriate trajectory under lateral fluoroscopy.  At this point the microscope was sterilely draped and brought into the field.    L4-5: Using Bovie electrocautery, soft tissue was cleared from the lamina and facet at L4-5.  Using a high-speed drill and collecting the autograft, a laminectomy and complete facetectomy was performed down to the ligamentum flavum.  This autograft was saved for later.  The ligamentum flavum was identified to its attachment along the superior lamina and lateral pars.  Using a series of micro curettes, the ligamentum flavum was gently elevated and the epidural space was identified.  The ligamentum flavum was completely  resected using Kerrison rongeurs.  The traversing and exiting nerve roots were identified.  Using Kerrison rongeurs, the common dural tube and traversing and exiting nerve roots were completely decompressed from the ligamentum flavum.  They appeared pulsatile and noncompressed.  Camden's triangle was identified, the traversing nerve root was gently retracted medially, and the epidural space was coagulated and cleared of epidural veins.  The disc space was identified.  The disc annulus was then coagulated and incised with an 11 blade.  Using Kerrison rongeurs the annulotomy was widened.  Using a series of disc prep shavers and curettes a radical discectomy was performed to subchondral bleeding bone at both endplates.  The disc base was then trialed and found to be in 12 mm interbody size.  A mixture of autograft and allograft was then placed anteriorly and medially and packed with a bone tamp.  Using a nerve root retractor, the traversing nerve root was gently protected during placement of the interbody.   Using lateral fluoroscopy, the interbody was then placed under live fluoroscopy.  Appropriate placement was identified.  The remainder of the autograft and allograft was then placed laterally and the intervertebral space to the interbody device.  Hemostasis was achieved with passive hemostatics and bipolar cautery.  The traversing nerve root,, neural tube and exiting nerve root were followed with a ball-tipped probe and noted to be noncompressed, pulsatile and in their normal position.  A few pieces of Gelfoam with thrombin were placed over the thecal sac.  The Metrix dilator tube was gently removed and hemostasis was achieved with bipolar cautery and the soft tissues.  L 3-4: The Metrix dilators were then again used to dock over the patient's right L3-4 lamina facet junction.  Using Bovie electrocautery, soft tissue was cleared from the lamina and facet at L 3-4.  Using a high-speed drill and collecting the  autograft, a laminectomy and complete facetectomy was performed down to the ligamentum flavum.  This autograft was saved for later.  The ligamentum flavum was identified to its attachment along the superior lamina and lateral pars.  Using a series of micro curettes, the ligamentum flavum was gently elevated and the epidural space was identified.  The ligamentum flavum was completely resected using Kerrison rongeurs.  The traversing and exiting nerve roots were identified.  Using Kerrison rongeurs, the common dural tube and traversing and exiting nerve roots were completely decompressed from the ligamentum flavum.  They appeared pulsatile and noncompressed.  Camden's triangle was identified, the traversing nerve root was gently retracted medially, and the epidural space was coagulated and cleared of epidural veins.  The disc space was identified.  The disc annulus was then coagulated and incised with an 11 blade.  Using Kerrison rongeurs the annulotomy was widened.  Using a series of disc prep shavers and curettes a radical discectomy was performed to subchondral bleeding bone at both endplates.  The disc base was then trialed and found to be in 12 mm interbody size.  A mixture of autograft and allograft was then placed anteriorly and medially and packed with a bone tamp.  Using a nerve root retractor, the traversing nerve root was gently protected during placement of the interbody.   Using lateral fluoroscopy, the interbody was then placed under live fluoroscopy.  Appropriate placement was identified.  The remainder of the autograft and allograft was then placed laterally and the intervertebral space to the interbody device.  Hemostasis was achieved with passive hemostatics and bipolar  cautery.  The traversing nerve root,, neural tube and exiting nerve root were followed with a ball-tipped probe and noted to be noncompressed, pulsatile and in their normal position.  A few pieces of Gelfoam with thrombin were  placed over the thecal sac.  The Metrix dilator tube was gently removed and hemostasis was achieved with bipolar cautery and the soft tissues.   The above listed pedicle screws were then placed under lateral fluoroscopy over the K wires bilaterally at L3, L4, L5.  Appropriate sized rods were measured, contoured and placed percutaneously.  These were confirmed to be within the tulips, setscrews were placed and final tightened to the manufacturer's recommendations.  Percutaneous towers were removed from the screws.  Final AP and lateral fluoroscopy images confirmed appropriate placement of all hardware.  Hemostasis was achieved with bipolar cautery.  The wound was closed in layers, 2-0 and 3-0 Vicryl sutures for the dermis.  Skin was closed with skin glue.  Sterile dressing was applied.   At the end of the case all sponge, needle, and instrument counts were correct. The patient was then transferred to the stretcher, extubated, and taken to the post-anesthesia care unit in stable hemodynamic condition.

## 2022-06-05 NOTE — Transfer of Care (Signed)
Immediate Anesthesia Transfer of Care Note  Patient: Charles [name]  Procedure(s) Performed: MINIMALLY INVASIVE DECOMPRESSION ,TRANSFORAMINAL LUMBAR INTERBODY FUSION, RIGHT SIDED APPROACH LUMBAR THRE-FOUR, LUMBAR FOUR-FIVE (Right)  Patient Location: PACU  Anesthesia Type:General  Level of Consciousness: sedated  Airway & Oxygen Therapy: Patient Spontanous Breathing and Patient connected to face mask oxygen  Post-op Assessment: Report given to RN and Post -op Vital signs reviewed and stable  Post vital signs: Reviewed and stable  Last Vitals:  Vitals Value Taken Time  BP 127/64 06/05/22 1532  Temp 98   Pulse 66 06/05/22 1538  Resp 13 06/05/22 1538  SpO2 96 % 06/05/22 1538  Vitals shown include unvalidated device data.  Last Pain:  Vitals:   06/05/22 0855  TempSrc:   PainSc: 2          Complications: No notable events documented.

## 2022-06-06 ENCOUNTER — Other Ambulatory Visit (HOSPITAL_COMMUNITY): Payer: Self-pay

## 2022-06-06 DIAGNOSIS — M4316 Spondylolisthesis, lumbar region: Secondary | ICD-10-CM | POA: Diagnosis not present

## 2022-06-06 MED ORDER — METHOCARBAMOL 500 MG PO TABS
500.0000 mg | ORAL_TABLET | Freq: Four times a day (QID) | ORAL | 2 refills | Status: DC | PRN
  Filled 2022-06-06: qty 90, 23d supply, fill #0
  Filled 2022-06-25: qty 90, 23d supply, fill #1
  Filled 2022-06-26 – 2022-08-07 (×2): qty 90, 23d supply, fill #2

## 2022-06-06 MED ORDER — ASPIRIN 81 MG PO TBEC
81.0000 mg | DELAYED_RELEASE_TABLET | Freq: Every day | ORAL | 0 refills | Status: AC
  Filled 2022-06-06: qty 30, 30d supply, fill #0

## 2022-06-06 MED ORDER — OXYCODONE HCL 10 MG PO TABS
10.0000 mg | ORAL_TABLET | ORAL | 0 refills | Status: DC | PRN
  Filled 2022-06-06: qty 15, 2d supply, fill #0
  Filled 2022-06-06: qty 30, 5d supply, fill #0
  Filled 2022-06-06: qty 15, 3d supply, fill #0

## 2022-06-06 NOTE — Anesthesia Postprocedure Evaluation (Signed)
Anesthesia Post Note  Patient: Charles [name]  Procedure(s) Performed: MINIMALLY INVASIVE DECOMPRESSION ,TRANSFORAMINAL LUMBAR INTERBODY FUSION, RIGHT SIDED APPROACH LUMBAR THRE-FOUR, LUMBAR FOUR-FIVE (Right)     Patient location during evaluation: PACU Anesthesia Type: General Level of consciousness: awake and alert Pain management: pain level controlled Vital Signs Assessment: post-procedure vital signs reviewed and stable Respiratory status: spontaneous breathing, nonlabored ventilation, respiratory function stable and patient connected to nasal cannula oxygen Cardiovascular status: blood pressure returned to baseline and stable Postop Assessment: no apparent nausea or vomiting Anesthetic complications: no   No notable events documented.  Last Vitals:  Vitals:   06/06/22 0400 06/06/22 0916  BP: (!) 113/56 (!) 113/55  Pulse: 65 76  Resp: 16 16  Temp: 36.8 C 36.8 C  SpO2: 96% 96%    Last Pain:  Vitals:   06/06/22 0916  TempSrc: Oral  PainSc:                  [name]

## 2022-06-06 NOTE — Discharge Instructions (Signed)
Wound Care Remove dressing in 3 days Leave incision open to air. You may shower. Do not scrub directly on incision.  Do not put any creams, lotions, or ointments on incision. Activity Walk each and every day, increasing distance each day. No lifting greater than 5 lbs.  Avoid bending, arching, and twisting. No driving for 2 weeks; may ride as a passenger locally.  Diet Resume your normal diet.  Return to Work Will be discussed at you follow up appointment. Call Your Doctor If Any of These Occur Redness, drainage, or swelling at the wound.  Temperature greater than 101 degrees. Severe pain not relieved by pain medication. Incision starts to come apart. Follow Up Appt Call today for appointment in 2 weeks ([phone]) or for problems.  If you have any hardware placed in your spine, you will need an x-ray before your appointment.

## 2022-06-06 NOTE — Evaluation (Signed)
Occupational Therapy Evaluation Patient Details Name: Charles [name] MRN: [id number] DOB: Jan 25, 1956 Today's Date: 06/06/2022   History of Present Illness Patient is a 66 yo male presenting to the hospital for L3-L5 fusion on 06/05/22. 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 Currently, patient is mod I for ADLs and mobility, with excellent demonstration and verbalization of back precautions in session. Patient does not require further OT intervention at this time. OT signing off; please re-consult if further needs arise.      Recommendations for follow up therapy are one component of a multi-disciplinary discharge planning process, led by the attending physician.  Recommendations may be updated based on patient status, additional functional criteria and insurance authorization.   Follow Up Recommendations  No OT follow up     Assistance Recommended at Discharge Set up Supervision/Assistance  Patient can return home with the following A little help with bathing/dressing/bathroom;Assist for transportation    Functional Status Assessment  Patient has had a recent decline in their functional status and demonstrates the ability to make significant improvements in function in a reasonable and predictable amount of time.  Equipment Recommendations  None recommended by OT (Patient has DME needed)    Recommendations for Other Services       Precautions / Restrictions Precautions Precautions: Back Precaution Booklet Issued: Yes (comment) Precaution Comments: Patient able to verbalize and adhere to all precautions Restrictions Weight Bearing Restrictions: No      Mobility Bed Mobility               General bed mobility comments: up in recliner upon arrival, plans to sleep in electric recliner couch initially    Transfers Overall transfer  level: Modified independent                 General transfer comment: minimal extra time required, no verbal cues needed for hand placement or pacing, excellent awareness      Balance Overall balance assessment: Modified Independent                                         ADL either performed or assessed with clinical judgement   ADL Overall ADL's : Modified independent                                       General ADL Comments: Patient able to complete dressing adhering to back precautions without issue, will have assist from family if he needs for lower body dressing when pain is more severe     Vision Baseline Vision/History: 1 Wears glasses Ability to See in Adequate Light: 0 Adequate Patient Visual Report: No change from baseline       Perception     Praxis      Pertinent Vitals/Pain Pain Assessment Pain Assessment: Faces Faces Pain Scale: Hurts little more Pain Location: incisional pain at back Pain Descriptors / Indicators: Grimacing, Discomfort Pain Intervention(s): Limited activity within patient's tolerance, Monitored during session, Repositioned     Hand Dominance     Extremity/Trunk Assessment Upper Extremity Assessment Upper Extremity Assessment: Overall WFL for tasks assessed   Lower Extremity Assessment Lower Extremity Assessment: Overall WFL for tasks assessed  Cervical / Trunk Assessment Cervical / Trunk Assessment: Back Surgery   Communication Communication Communication: HOH   Cognition Arousal/Alertness: Awake/alert Behavior During Therapy: WFL for tasks assessed/performed Overall Cognitive Status: Within Functional Limits for tasks assessed                                 General Comments: Alert and eager to participate     General Comments       Exercises     Shoulder Instructions      Home Living Family/patient expects to be discharged to:: Private residence Living  Arrangements: Spouse/significant other Available Help at Discharge: Family;Friend(s);Neighbor Type of Home: House Home Access: Stairs to enter [employer] of Steps: 1 up to porch, then 1 to get into the house Entrance Stairs-Rails: Right Home Layout: One level     Bathroom Shower/Tub: Occupational psychologist: [place]: Conservation officer, nature (2 wheels);Rollator (4 wheels);Cane - single point          Prior Functioning/Environment Prior Level of Function : Independent/Modified Independent;Working/employed;Driving             Mobility Comments: independent, works second shift at [employer] ADLs Comments: independent        OT Problem List: Decreased strength;Decreased activity tolerance      OT Treatment/Interventions:      OT Goals(Current goals can be found in the care plan section) Acute Rehab OT Goals Patient Stated Goal: to get back to my normal routine OT Goal Formulation: With patient Time For Goal Achievement: 06/20/22 Potential to Achieve Goals: Good  OT Frequency:      Co-evaluation              AM-PAC OT "6 Clicks" Daily Activity     Outcome Measure Help from another person eating meals?: None Help from another person taking care of personal grooming?: None Help from another person toileting, which includes using toliet, bedpan, or urinal?: None Help from another person bathing (including washing, rinsing, drying)?: A Little Help from another person to put on and taking off regular upper body clothing?: None Help from another person to put on and taking off regular lower body clothing?: A Little 6 Click Score: 22   End of Session Nurse Communication: Mobility status;Other (comment) (No need for PT eval)  Activity Tolerance: Patient tolerated treatment well Patient left: in chair;with call bell/phone within reach;with family/visitor present  OT Visit Diagnosis: Unsteadiness on feet (R26.81)                Time:  5631-4970 OT Time Calculation (min): 25 min Charges:  OT General Charges $OT Visit: 1 Visit OT Evaluation $OT Eval Moderate Complexity: 1 Mod OT Treatments $Self Care/Home Management : 8-22 mins  Corinne Ports E. Bacilio Abascal, OTR/L Acute Rehabilitation Services [phone]   Ascencion Dike 06/06/2022, 8:48 AM

## 2022-06-06 NOTE — Progress Notes (Signed)
Patient alert and oriented, mae's well, voiding adequate amount of urine, swallowing without difficulty, no c/o pain at time of discharge. Patient discharged home with family. Script and discharged instructions given to patient. Patient and family stated understanding of instructions given. Patient has an appointment with Dr Reatha Armour in 2 weeks

## 2022-06-06 NOTE — Discharge Summary (Signed)
  Physician Discharge Summary  Patient ID: Charles [name] MRN: [id number] DOB/AGE: May 14, 1956 66 y.o.  Admit date: 06/05/2022 Discharge date: 06/06/2022  Admission Diagnoses:  L3-5 stenosis, spondylosis and radiculopathy, L4-5 spondylolisthesis  Discharge Diagnoses:  Same Principal Problem:   Spondylolisthesis, lumbar region   Discharged Condition: Stable  Hospital Course:  Charles [name] is a 66 y.o. male underwent elective L3-5 TLIF.  He tolerated the surgery well.  He was ambulating independently postoperatively.  His pain was controlled on oral medication, he was having normal bowel bladder function.  He was tolerating normal diet.  Incisions were clean dry and intact.  Treatments: Surgery -L3-4, L4-5 minimal invasive decompression, transforaminal lumbar interbody fusion  Discharge Exam: Blood pressure (!) 113/56, pulse 65, temperature 98.3 F (36.8 C), temperature source Oral, resp. rate 16, height 5' 8.5" (1.74 m), weight 108.9 kg, SpO2 96 %. Awake, alert, oriented x3 Speech fluent, appropriate CN grossly intact 5/5 BUE/BLE Wound c/d/i  Disposition: Discharge disposition: 01-Home or Self Care       Discharge Instructions     Incentive spirometry RT   Complete by: As directed       Allergies as of 06/06/2022   No Known Allergies      Medication List     STOP taking these medications    HYDROcodone-acetaminophen 5-325 MG tablet Commonly known as: NORCO/VICODIN   meloxicam 15 MG tablet Commonly known as: MOBIC       TAKE these medications    acetaminophen 500 MG tablet Commonly known as: TYLENOL Take 1,000-1,500 mg by mouth daily.   Alpha-Lipoic Acid 600 MG Caps Take 600 mg by mouth 2 (two) times daily.   aspirin 81 MG tablet Take 1 tablet (81 mg total) by mouth daily. Start taking on: June 12, 2022 What changed: These instructions start on June 12, 2022. If you are unsure what to do until then, ask your doctor or other care  provider.   cetirizine 10 MG tablet Commonly known as: ZYRTEC Take 10 mg by mouth daily.   esomeprazole 40 MG capsule Commonly known as: NEXIUM Take 1 capsule (40 mg total) by mouth daily.   fluticasone 50 MCG/ACT nasal spray Commonly known as: FLONASE Place 1 spray into both nostrils daily. What changed:  when to take this reasons to take this   ketoconazole 2 % cream Commonly known as: NIZORAL Apply topically 2 (two) times daily. What changed:  how much to take when to take this reasons to take this   lisinopril-hydrochlorothiazide 20-25 MG tablet Commonly known as: ZESTORETIC Take 1 tablet by mouth daily.   methocarbamol 500 MG tablet Commonly known as: ROBAXIN Take 1 tablet (500 mg total) by mouth every 6 (six) hours as needed for muscle spasms.   Oxycodone HCl 10 MG Tabs Take 1 tablet (10 mg total) by mouth every 4 (four) hours as needed for severe pain ((score 7 to 10)).   rosuvastatin 10 MG tablet Commonly known as: CRESTOR Take 1 tablet (10 mg total) by mouth daily.   Vitamin D3 50 MCG (2000 UT) Tabs Take 2,000 Units by mouth.        Follow-up Information     Constancia Geeting C, DO. Call.   Why: As needed, If symptoms worsen Contact information: [address] [place] [address] [phone]                 Signed: Theodoro Doing Kamyia Thomason 06/06/2022, 8:54 AM

## 2022-06-06 NOTE — Plan of Care (Signed)

## 2022-06-06 NOTE — Progress Notes (Signed)
PT Cancellation Note  Patient Details Name: RAMZEY PETROVIC MRN: [id number] DOB: 1955-09-21   Cancelled Treatment:    Reason Eval/Treat Not Completed: PT screened, no needs identified, will sign off  Discussed pt status with OT, who indicated he is doing quite well and does not need PT eval;  Will sign off;   Thank you,   Roney Marion, [place] [facility] [place] 06/06/2022, 8:41 AM

## 2022-06-08 ENCOUNTER — Encounter (HOSPITAL_COMMUNITY): Payer: Self-pay | Admitting: Neurological Surgery

## 2022-06-11 ENCOUNTER — Other Ambulatory Visit (HOSPITAL_COMMUNITY): Payer: Self-pay

## 2022-06-11 MED ORDER — OXYCODONE HCL 10 MG PO TABS
10.0000 mg | ORAL_TABLET | Freq: Four times a day (QID) | ORAL | 0 refills | Status: DC
  Filled 2022-06-11: qty 30, 8d supply, fill #0

## 2022-06-15 ENCOUNTER — Other Ambulatory Visit (HOSPITAL_COMMUNITY): Payer: Self-pay

## 2022-06-15 MED ORDER — OXYCODONE HCL 10 MG PO TABS
10.0000 mg | ORAL_TABLET | ORAL | 0 refills | Status: DC
  Filled 2022-06-15 (×2): qty 30, 5d supply, fill #0

## 2022-06-20 DIAGNOSIS — M48062 Spinal stenosis, lumbar region with neurogenic claudication: Secondary | ICD-10-CM | POA: Diagnosis not present

## 2022-06-25 ENCOUNTER — Other Ambulatory Visit (HOSPITAL_COMMUNITY): Payer: Self-pay

## 2022-06-26 ENCOUNTER — Other Ambulatory Visit (HOSPITAL_COMMUNITY): Payer: Self-pay

## 2022-06-27 ENCOUNTER — Other Ambulatory Visit (HOSPITAL_COMMUNITY): Payer: Self-pay

## 2022-06-27 MED ORDER — OXYCODONE HCL 10 MG PO TABS
10.0000 mg | ORAL_TABLET | ORAL | 0 refills | Status: DC
  Filled 2022-06-27: qty 30, 5d supply, fill #0

## 2022-06-29 ENCOUNTER — Encounter: Payer: Self-pay | Admitting: Neurology

## 2022-06-29 ENCOUNTER — Ambulatory Visit: Payer: 59 | Admitting: Neurology

## 2022-06-29 VITALS — BP 126/79 | HR 62 | Ht 68.5 in | Wt 240.0 lb

## 2022-06-29 DIAGNOSIS — G5603 Carpal tunnel syndrome, bilateral upper limbs: Secondary | ICD-10-CM | POA: Diagnosis not present

## 2022-06-29 DIAGNOSIS — R202 Paresthesia of skin: Secondary | ICD-10-CM

## 2022-06-29 DIAGNOSIS — G5602 Carpal tunnel syndrome, left upper limb: Secondary | ICD-10-CM | POA: Insufficient documentation

## 2022-06-29 NOTE — Progress Notes (Signed)
Chief Complaint  Patient presents with   Procedure    Rm EMG/NCV 3.      ASSESSMENT AND PLAN  Charles [name] is a 66 y.o. male  Bilateral hands paresthesia  Severe bilateral carpal tunnel syndromes, most noticeable at his left dominant hand, with evidence of axonal loss, he would benefit left carpal tunnel release surgery, right side is also severe, but demyelinating in nature,  I have asked advising wearing wrist splint, may contact his neurosurgeon Dr. Reatha Armour to discuss left carpal tunnel release surgery, I have put in the referral.  History of lumbar severest gnosis, and L4-5, L3-4  Status post decompression by Dr. Reatha Armour on Jun 05 2022, which has helped his severe low back pain, still has frequent lower extremity muscle cramping, intermittent paresthesia  No large fiber peripheral neuropathy,   DIAGNOSTIC DATA (LABS, IMAGING, TESTING) - I reviewed patient records, labs, notes, testing and imaging myself where available.   MEDICAL HISTORY:  Charles [name] is a 66 year old left-handed male, seen in request by Dr. Jaynee Eagles for electrodiagnostic study for evaluation of left hand paresthesia,  I reviewed and summarized the referring note. PMHX. HTN GERD Left shoulder rotator cuff,  Chronic lymphoblastic leukemia, had chemotherapy in 2014, in remission Chronic low back pain  He had long history of chronic low back pain, intermittent left hand paresthesia gradually getting worse over the past few years, was seen by Dr. Jaynee Eagles end of September 2023,  Personally reviewed MRI of lumbar spine September 2023, lumbar spondylosis, short pedicles, severe canal stenosis L4-5, thecal sac at this level was only 0.3 cm, variable degree of foraminal narrowing,  He underwent minimum invasive L3-4, L4-5 decompression and transforaminal lumbar interbody fusion and anterolisthesis by Dr. 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  Laboratory evaluations in October 2023: A1c 6.5, normal vitamin B1, protein electrophoresis, P37, folic acid, B6, normal CBC, hemoglobin of 15.8, CMP, creatinine of 1.18  EMG nerve conduction study today showed evidence of bilateral carpal tunnel syndromes, severe on the left side with evidence of axonal loss, right side is also severe but demyelinating in nature, there is also evidence of chronic left lumbar radiculopathy, no evidence of large fiber peripheral neuropathy.  PHYSICAL EXAM:   Vitals:   06/29/22 0934  BP: 126/79  Pulse: 62  Weight: 240 lb (108.9 kg)  Height: 5' 8.5" (1.74 m)   Not recorded     Body mass index is 35.96 kg/m.  PHYSICAL EXAMNIATION:  Gen: NAD, conversant, well nourised, well groomed                     Cardiovascular: Regular rate rhythm, no peripheral edema, warm, nontender. Eyes: Conjunctivae clear without exudates or hemorrhage Neck: Supple, no carotid bruits. Pulmonary: Clear to auscultation bilaterally   NEUROLOGICAL EXAM:  MENTAL STATUS: Speech/cognition: Awake, alert, oriented to history taking and casual conversation CRANIAL NERVES: CN II: Visual fields are full to confrontation. Pupils are round equal and briskly reactive to light. CN III, IV, VI: extraocular movement are normal. No ptosis. CN V: Facial sensation is intact to light touch CN VII: Face is symmetric with normal eye closure  CN VIII: Hearing is normal  to causal conversation. CN IX, X: Phonation is normal. CN XI: Head turning and shoulder shrug are intact  MOTOR: There is no pronator drift of out-stretched arms. Muscle bulk and tone are normal. Muscle strength is normal.  REFLEXES: Reflexes are 2+ and symmetric at the biceps,  triceps, knees, and ankles. Plantar responses are flexor.  SENSORY: Intact to light touch, pinprick and vibratory sensation are intact in fingers and toes.  COORDINATION: There is no trunk or limb dysmetria noted.  GAIT/STANCE: Posture is normal. Gait is steady with normal steps, base, arm swing, and turning. Heel and toe walking are normal. Tandem gait is normal.  Romberg is absent.  REVIEW OF SYSTEMS:  Full 14 system review of systems performed and notable only for as above All other review of systems were negative.   ALLERGIES: No Known Allergies  HOME MEDICATIONS: Current Outpatient Medications  Medication Sig Dispense Refill   acetaminophen (TYLENOL) 500 MG tablet Take 1,000-1,500 mg by mouth daily.     Alpha-Lipoic Acid 600 MG CAPS Take 600 mg by mouth 2 (two) times daily.     aspirin EC (ASPIRIN LOW DOSE) 81 MG tablet Take 1 tablet (81 mg total) by mouth daily. 30 tablet 0   cetirizine (ZYRTEC) 10 MG tablet Take 10 mg by mouth daily.     Cholecalciferol (VITAMIN D3) 2000 UNITS TABS Take 2,000 Units by mouth.     esomeprazole (NEXIUM) 40 MG capsule Take 1 capsule (40 mg total) by mouth daily. 90 capsule 3   fluticasone (FLONASE) 50 MCG/ACT nasal spray Place 1 spray into both nostrils daily. (Patient taking differently: Place 1 spray into both nostrils daily as needed for rhinitis.) 48 g 3   ketoconazole (NIZORAL) 2 % cream Apply topically 2 (two) times daily. (Patient taking differently: Apply 1 Application topically daily as needed (Itching).) 60 g 3   lisinopril-hydrochlorothiazide (ZESTORETIC) 20-25 MG tablet Take 1 tablet by mouth daily. 90 tablet 1   methocarbamol (ROBAXIN) 500 MG tablet Take 1 tablet (500 mg total) by mouth every 6 (six) hours as needed for muscle spasms. 90 tablet 2   Oxycodone HCl 10 MG TABS Take 1 tablet (10 mg total) by mouth every 4-6 hours. 30 tablet 0   rosuvastatin (CRESTOR) 10 MG tablet Take 1 tablet (10 mg total) by mouth daily. 90 tablet 1    No current facility-administered medications for this visit.    PAST MEDICAL HISTORY: Past Medical History:  Diagnosis Date   Allergy    seasonal   Arthritis    CLL (chronic lymphoblastic leukemia) 08/03/2011   CLL (chronic lymphocytic leukemia) ([place]) 03/18/2012   GERD (gastroesophageal reflux disease)    Hyperlipidemia    Hypertension    Shingles    Ulcer     PAST SURGICAL HISTORY: Past Surgical History:  Procedure Laterality Date   HERNIA REPAIR     KNEE ARTHROSCOPY WITH LATERAL MENISECTOMY Left 12/24/2019   Procedure: KNEE ARTHROSCOPY WITH PARTIAL LATERAL MENISECTOMY;  Surgeon: Mcarthur Rossetti, MD;  Location: [facility];  Service: Orthopedics;  Laterality: Left;   KNEE SURGERY Right    SHOULDER SURGERY Left    TRANSFORAMINAL LUMBAR INTERBODY FUSION W/ MIS 2 LEVEL Right 06/05/2022   Procedure: MINIMALLY INVASIVE DECOMPRESSION ,TRANSFORAMINAL LUMBAR INTERBODY FUSION, RIGHT SIDED APPROACH LUMBAR THRE-FOUR, LUMBAR FOUR-FIVE;  Surgeon: Dawley, Theodoro Doing, DO;  Location: [facility];  Service: Neurosurgery;  Laterality: Right;   WRIST SURGERY Right     FAMILY HISTORY: Family History  Problem Relation  Age of Onset   Diabetes Mother    Hyperlipidemia Father    Cancer Sister    Breast cancer Sister    Melanoma Sister    Cancer Brother 66       prostate cancer stage 53   Cancer Brother 50       prostate   Cancer Brother    Cancer - Colon Brother    Colon cancer Brother    Neuropathy Neg Hx     SOCIAL HISTORY: Social History   Socioeconomic History   Marital status: Married    Spouse name: Ginger   Number of children: 2   Years of education: Not on file   Highest education level: Not on file  Occupational History   Occupation: [employer]  Tobacco Use   Smoking status: Former    Packs/day: 2.00    Years: 20.00    Total pack years: 40.00    Types: Cigarettes    Start date: 08/24/1978    Quit date: 08/24/1998    Years since quitting: 23.8    Smokeless tobacco: Never   Tobacco comments:    quit smoking 15 years ago  Vaping Use   Vaping Use: Never used  Substance and Sexual Activity   Alcohol use: Yes    Alcohol/week: 0.0 standard drinks of alcohol    Comment: 1 a week   Drug use: No   Sexual activity: Yes    Partners: Female    Birth control/protection: None  Other Topics Concern   Not on file  Social History Narrative   Married. Education: [employer]. Exercise: Walk    Education: [employer]   Works for [employer] in [place] Strain: Not on [employer] Insecurity: Not on file  Transportation Needs: Not on file  Physical Activity: Not on file  Stress: Not on file  Social Connections: Not on file  Intimate Partner Violence: Not on file      Marcial Pacas, M.D. Ph.D.  [facility] [address], [place], [place] [address] Ph: [phone] Fax: [phone]  CC:  [address] [place], [address] [place],   [id number]  [place], [place]

## 2022-06-29 NOTE — Procedures (Signed)
Full Name: Charles [name] Gender: Male MRN #: [id number] Date of Birth: [birth year]    Visit Date: 06/29/2022 09:37 Age: 66 Years Examining Physician: Dr. Marcial Pacas Referring Physician: Dr. Sarina Ill Height: 5 feet 8 inch History: 66 year old male with severe lower lumbar stenosis, status post decompression, with intermittent bilateral feet paresthesia, frequent muscle cramping, few years history of progressive worsening left hand paresthesia, recent diagnosis of diabetes, A1c 6.5  Summary of the test: Nerve conduction study: Left median sensory and motor responses were absent.  Right median sensory responses showed mildly prolonged peak latency with mildly decreased snap amplitude.  Right median motor responses showed mildly prolonged distal latency, with mildly decreased CMAP amplitude.  Left sural sensory responses were normal.  Left tibial, bilateral peroneal motor responses were normal.  Electromyography:  Selected needle examination of left lower lower extremity muscles, bilateral upper extremity muscles were performed  There was evidence of chronic neuropathic changes involving left L4-5 S1 myotomes.  There is no evidence of active process.  Right abductor pollicis brevis showed increased insertional activity, mixed show of normal and mild enlarged motor unit potential with mildly decreased recruitment patterns.  Left abductor pollicis brevis showed increased insertional activity, 1+ spontaneous activity, enlarge motor unit potential with decreased recruitment patterns.  Conclusion: This is an abnormal study.  There is evidence of bilateral median neuropathy across the wrist, consistent with carpal tunnel syndromes, severe on both side, demyelinating on the right side, there is evidence of axonal loss on the left side.  He would benefit left carpal tunnel release surgery.  There is no evidence of large fiber peripheral neuropathy, there is evidence of chronic left lumbar  radiculopathy involving left L4-5 S1 myotomes.    ------------------------------- Marcial Pacas, M.D. Ph.D.  [facility] [address], [place], [place] [address] Tel: [phone] Fax: [phone]  Verbal informed consent was obtained from the patient, patient was informed of potential risk of procedure, including bruising, bleeding, hematoma formation, infection, muscle weakness, muscle pain, numbness, among others.        [place]    Nerve / Sites Muscle Latency Ref. Amplitude Ref. Rel Amp Segments Distance Velocity Ref. Area    ms ms mV mV %  cm m/s m/s mVms  L Median - APB     Wrist APB NR ?4.4 NR ?4.0 NR Wrist - APB 7   NR     Upper arm APB 8.4  1.2   Upper arm - Wrist   ?49 9.1  R Median - APB     Wrist APB 5.2 ?4.4 3.1 ?4.0 100 Wrist - APB 7   7.4     Upper arm APB 9.8  3.4  109 Upper arm - Wrist 24 52 ?49 8.4  L Ulnar - ADM     Wrist ADM 2.7 ?3.3 8.5 ?6.0 100 Wrist - ADM 7   25.9     B.Elbow ADM 5.1  6.9  81.4 B.Elbow - Wrist 13 54 ?49 20.3     A.Elbow ADM 8.6  7.3  105 A.Elbow - B.Elbow 22 64 ?49 24.6  R Ulnar - ADM     Wrist ADM 2.8 ?3.3 8.7 ?6.0 100 Wrist - ADM 7   22.6     B.Elbow ADM 5.2  8.1  93 B.Elbow - Wrist 15 63 ?49 20.6     A.Elbow ADM 7.9  7.9  98.2 A.Elbow - B.Elbow 15 56 ?49 22.7  L Tibial - AH  Ankle AH 3.8 ?5.8 10.7 ?4.0 100 Ankle - AH 9   22.7     Pop fossa AH 15.0  8.3  78.1 Pop fossa - Ankle 47 42 ?41 20.6               SNC    Nerve / Sites Rec. Site Peak Lat Ref.  Amp Ref. Segments Distance    ms ms V V  cm  L Median - Digit II (Antidromic)     Wrist Dig II 6.3 ?3.5 4 ?20 Wrist - Dig II 13  L Sural - Ankle (Calf)     Calf Ankle 3.6 ?4.4 8 ?6 Calf - Ankle 14  L Median - Orthodromic (Dig II, Mid palm)     Dig II Wrist NR ?3.4 NR ?10 Dig II - Wrist 13  R Median - Orthodromic (Dig II, Mid palm)     Dig II Wrist 3.5 ?3.4 6 ?10 Dig II - Wrist 13  R Ulnar - Orthodromic, (Dig V, Mid palm)     Dig V Wrist 2.5 ?3.1 5 ?5 Dig  V - Wrist 11  L Ulnar - Orthodromic, (Dig V, Mid palm)     Dig V Wrist 2.6 ?3.1 12 ?5 Dig V - Wrist 14                 F  Wave    Nerve F Lat Ref.   ms ms  L Ulnar - ADM 29.7 ?32.0  R Ulnar - ADM 28.0 ?32.0  L Tibial - AH 55.9 ?56.0           EMG Summary Table    Spontaneous MUAP Recruitment  Muscle IA Fib PSW Fasc Other Amp Dur. Poly Pattern  L. Tibialis anterior Normal None None None _______ Normal Normal Normal Reduced  L. Tibialis posterior Normal None None None _______ Normal Normal Normal Reduced  L. Peroneus longus Normal None None None _______ Normal Normal Normal Reduced  L. Gastrocnemius (Medial head) Normal None None None _______ Normal Normal Normal Reduced  L. Vastus lateralis Normal None None None _______ Normal Normal Normal Reduced  R. First dorsal interosseous Normal None None None _______ Normal Normal Normal Normal  R. Abductor pollicis brevis Increased None None None _______ Increased Increased 1+ Reduced  L. First dorsal interosseous Normal None None None _______ Normal Normal Normal Normal  R.  Abductor pollicis brevis Normal None None None  Normal Normal Normal Reduced  L. Abductor pollicis brevis Increased 2+ 2+ None _______ Increased Increased 1+ Reduced  L. Pronator teres Normal None None None _______ Normal Normal Normal Normal  L. Biceps brachii Normal None None None _______ Normal Normal Normal Normal  L. Deltoid Normal None None None _______ Normal Normal Normal Normal  L. Triceps brachii Normal None None None _______ Normal Normal Normal Normal

## 2022-07-02 ENCOUNTER — Telehealth: Payer: Self-pay | Admitting: Neurology

## 2022-07-02 ENCOUNTER — Telehealth: Payer: Self-pay | Admitting: *Deleted

## 2022-07-02 ENCOUNTER — Other Ambulatory Visit: Payer: Self-pay | Admitting: Neurology

## 2022-07-02 DIAGNOSIS — G5603 Carpal tunnel syndrome, bilateral upper limbs: Secondary | ICD-10-CM

## 2022-07-02 NOTE — Telephone Encounter (Signed)
-----   Message from Melvenia Beam, MD sent at 07/02/2022 12:54 PM EST ----- Patient has severe carpal tunnel I would like to refer him to the hand center for surgery. No neuropathy in the leg but there is chronic left lumbar radiculopathy involving left L4-5/ L5-S1 which is why he has that shooting pain down his leg and is consistent with the MRI lumbar spine we did a few months ago, he is already seeing doctors for his low back. Please let me know if he is willing to be referred for hand surgery and I can place order to the [facility] center or [facility] or wherever he already is established. thanks

## 2022-07-02 NOTE — Telephone Encounter (Signed)
Referral sent to Dr. Reatha Armour at [facility], phone # [phone].

## 2022-07-02 NOTE — Telephone Encounter (Signed)
Ordered, thanks.

## 2022-07-02 NOTE — Telephone Encounter (Signed)
Spoke to patient gave test results . Pt agrees to hand surgery referral  Pt states no questions at this time . Pt expressed understanding . Will forward to Dr.Ahern for hand surgery referral .

## 2022-07-03 ENCOUNTER — Telehealth: Payer: Self-pay | Admitting: Neurology

## 2022-07-03 NOTE — Telephone Encounter (Signed)
Referral for hand surgery fax to [facility]. Phone: [phone], Fax: [phone]

## 2022-07-20 ENCOUNTER — Other Ambulatory Visit (HOSPITAL_COMMUNITY): Payer: Self-pay

## 2022-08-01 ENCOUNTER — Other Ambulatory Visit (HOSPITAL_COMMUNITY): Payer: Self-pay

## 2022-08-01 MED ORDER — MELOXICAM 15 MG PO TABS
15.0000 mg | ORAL_TABLET | Freq: Every day | ORAL | 0 refills | Status: DC
  Filled 2022-08-01: qty 90, 90d supply, fill #0

## 2022-08-02 ENCOUNTER — Other Ambulatory Visit (HOSPITAL_COMMUNITY): Payer: Self-pay

## 2022-08-02 MED ORDER — MELOXICAM 15 MG PO TABS
15.0000 mg | ORAL_TABLET | Freq: Every day | ORAL | 0 refills | Status: DC
  Filled 2022-08-02 – 2022-11-05 (×5): qty 90, 90d supply, fill #0

## 2022-08-13 ENCOUNTER — Other Ambulatory Visit (HOSPITAL_COMMUNITY): Payer: Self-pay

## 2022-08-14 ENCOUNTER — Other Ambulatory Visit (HOSPITAL_COMMUNITY): Payer: Self-pay

## 2022-08-14 ENCOUNTER — Ambulatory Visit
Admission: EM | Admit: 2022-08-14 | Discharge: 2022-08-14 | Disposition: A | Discharge status: Home / Self Care | Payer: Commercial Managed Care - PPO | Attending: Physician Assistant | Admitting: Physician Assistant

## 2022-08-14 DIAGNOSIS — R051 Acute cough: Secondary | ICD-10-CM

## 2022-08-14 DIAGNOSIS — J069 Acute upper respiratory infection, unspecified: Secondary | ICD-10-CM

## 2022-08-14 MED ORDER — PROMETHAZINE-DM 6.25-15 MG/5ML PO SYRP
5.0000 mL | ORAL_SOLUTION | Freq: Three times a day (TID) | ORAL | 0 refills | Status: DC
  Filled 2022-08-14: qty 118, 8d supply, fill #0

## 2022-08-14 NOTE — ED Provider Notes (Signed)
[facility]    CSN: [id number] Arrival date & time: 08/14/22  0848      History   Chief Complaint Chief Complaint  Patient presents with   Nasal Congestion    Cough and nasal congestion. X1 week    HPI RAYSHAD RIVIELLO is a 67 y.o. male.   67 year old male presents with cough.  Patient indicates for the past week he has been having intermittent cough, which has been dry and nonproductive.  The patient indicates he has had some very mild upper respiratory congestion with intermittent postnasal drip.  He indicates that occasionally he will get irritation from the upper respiratory tract and throat which causes him to cough and hack a lot mainly during the evening.  Dates he does not have any fever, chills, body aches, wheezing, shortness of breath.  He indicates he feels good except for the intermittent cough.  He indicates he has not been taking any OTC medications and does not like to do so due to possible side effects.  He is tolerating fluids well.     Past Medical History:  Diagnosis Date   Allergy    seasonal   Arthritis    CLL (chronic lymphoblastic leukemia) 08/03/2011   CLL (chronic lymphocytic leukemia) ([place]) 03/18/2012   GERD (gastroesophageal reflux disease)    Hyperlipidemia    Hypertension    Shingles    Ulcer     Patient Active Problem List   Diagnosis Date Noted   Bilateral carpal tunnel syndrome 06/29/2022   Paresthesia 06/29/2022   Spondylolisthesis, lumbar region 06/05/2022   Elevated LFTs 05/23/2020   Acute medial meniscus tear of left knee 12/24/2019   Hearing loss 02/19/2019   History of shingles 02/19/2019   Family history of prostate cancer 08/28/2018   Poor dentition 08/28/2018   Essential hypertension, benign 08/02/2016   Pre-diabetes 07/14/2015   Obesity 07/14/2015   GERD (gastroesophageal reflux disease) 12/25/2012   Pure hypercholesterolemia 12/25/2012   CLL (chronic lymphocytic leukemia) ([place]) 03/18/2012    Past  Surgical History:  Procedure Laterality Date   HERNIA REPAIR     KNEE ARTHROSCOPY WITH LATERAL MENISECTOMY Left 12/24/2019   Procedure: KNEE ARTHROSCOPY WITH PARTIAL LATERAL MENISECTOMY;  Surgeon: Mcarthur Rossetti, MD;  Location: [facility];  Service: Orthopedics;  Laterality: Left;   KNEE SURGERY Right    SHOULDER SURGERY Left    TRANSFORAMINAL LUMBAR INTERBODY FUSION W/ MIS 2 LEVEL Right 06/05/2022   Procedure: MINIMALLY INVASIVE DECOMPRESSION ,TRANSFORAMINAL LUMBAR INTERBODY FUSION, RIGHT SIDED APPROACH LUMBAR THRE-FOUR, LUMBAR FOUR-FIVE;  Surgeon: Dawley, Theodoro Doing, DO;  Location: [facility];  Service: Neurosurgery;  Laterality: Right;   WRIST SURGERY Right        Home Medications    Prior to Admission medications   Medication Sig Start Date End Date Taking? Authorizing Provider  acetaminophen (TYLENOL) 500 MG tablet Take 1,000-1,500 mg by mouth daily.   Yes [provider]  Alpha-Lipoic Acid 600 MG CAPS Take 600 mg by mouth 2 (two) times daily.   Yes [provider]  cetirizine (ZYRTEC) 10 MG tablet Take 10 mg by mouth daily.   Yes [provider]  Cholecalciferol (VITAMIN D3) 2000 UNITS TABS Take 2,000 Units by mouth.   Yes [provider]  esomeprazole (NEXIUM) 40 MG capsule Take 1 capsule (40 mg total) by mouth daily. 09/30/21  Yes   fluticasone (FLONASE) 50 MCG/ACT nasal spray Place 1 spray into both nostrils daily. Patient taking differently: Place 1 spray  into both nostrils daily as needed for rhinitis. 12/11/21  Yes   ketoconazole (NIZORAL) 2 % cream Apply topically 2 (two) times daily. Patient taking differently: Apply 1 Application topically daily as needed (Itching). 12/11/21  Yes   lisinopril-hydrochlorothiazide (ZESTORETIC) 20-25 MG tablet Take 1 tablet by mouth daily. 05/22/22  Yes   meloxicam (MOBIC) 15 MG tablet Take 1 tablet (15 mg total) by mouth daily. 08/02/22  Yes   methocarbamol (ROBAXIN) 500 MG tablet Take 1 tablet  (500 mg total) by mouth every 6 (six) hours as needed for muscle spasms. 06/06/22  Yes Dawley, Troy C, DO  Oxycodone HCl 10 MG TABS Take 1 tablet (10 mg total) by mouth every 4-6 hours. 06/15/22  Yes   promethazine-dextromethorphan (PROMETHAZINE-DM) 6.25-15 MG/5ML syrup Take 5 mLs by mouth 3 (three) times daily. 08/14/22  Yes Nyoka Lint, PA-C  rosuvastatin (CRESTOR) 10 MG tablet Take 1 tablet (10 mg total) by mouth daily. 03/29/22  Yes   aspirin EC (ASPIRIN LOW DOSE) 81 MG tablet Take 1 tablet (81 mg total) by mouth daily. 06/12/22   Dawley, Theodoro Doing, DO    Family History Family History  Problem Relation Age of Onset   Diabetes Mother    Hyperlipidemia Father    Cancer Sister    Breast cancer Sister    Melanoma Sister    Cancer Brother 53       prostate cancer stage 4   Cancer Brother 45       prostate   Cancer Brother    Cancer - Colon Brother    Colon cancer Brother    Neuropathy Neg Hx     Social History Social History   Tobacco Use   Smoking status: Former    Packs/day: 2.00    Years: 20.00    Total pack years: 40.00    Types: Cigarettes    Start date: 08/24/1978    Quit date: 08/24/1998    Years since quitting: 23.9   Smokeless tobacco: Never   Tobacco comments:    quit smoking 15 years ago  Vaping Use   Vaping Use: Never used  Substance Use Topics   Alcohol use: Yes    Alcohol/week: 0.0 standard drinks of alcohol    Comment: 1 a week   Drug use: No     Allergies   Patient has no known allergies.   Review of Systems Review of Systems  Respiratory:  Positive for cough.      Physical Exam Triage Vital Signs ED Triage Vitals  Enc Vitals Group     BP 08/14/22 1033 110/67     Pulse Rate 08/14/22 1033 (!) 56     Resp 08/14/22 1033 16     Temp 08/14/22 1033 98.1 F (36.7 C)     Temp Source 08/14/22 1033 Oral     SpO2 08/14/22 1033 94 %     Weight 08/14/22 1032 240 lb (108.9 kg)     Height 08/14/22 1032 '5\' 8"'$  (1.727 m)     Head Circumference --       Peak Flow --      Pain Score 08/14/22 1032 0     Pain Loc --      Pain Edu? --      Excl. in [place]? --    No data found.  Updated Vital Signs BP 110/67 (BP Location: Left Arm)   Pulse (!) 56   Temp 98.1 F (36.7 C) (Oral)   Resp 16   Ht  $'5\' 8"'Y$  (1.727 m)   Wt 240 lb (108.9 kg)   SpO2 94%   BMI 36.49 kg/m   Visual Acuity Right Eye Distance:   Left Eye Distance:   Bilateral Distance:    Right Eye Near:   Left Eye Near:    Bilateral Near:     Physical Exam Constitutional:      Appearance: Normal appearance.  HENT:     Right Ear: Tympanic membrane and ear canal normal.     Left Ear: Tympanic membrane and ear canal normal.     Mouth/Throat:     Mouth: Mucous membranes are moist.     Pharynx: Oropharynx is clear.  Cardiovascular:     Rate and Rhythm: Normal rate and regular rhythm.     Heart sounds: Normal heart sounds.  Pulmonary:     Effort: Pulmonary effort is normal.     Breath sounds: Normal breath sounds and air entry. No wheezing, rhonchi or rales.  Lymphadenopathy:     Cervical: No cervical adenopathy.  Neurological:     Mental Status: He is alert.      UC Treatments / Results  Labs (all labs ordered are listed, but only abnormal results are displayed) Labs Reviewed - No data to display  EKG   Radiology No results found.  Procedures Procedures (including critical care time)  Medications Ordered in UC Medications - No data to display  Initial Impression / Assessment and Plan / UC Course  I have reviewed the triage vital signs and the nursing notes.  Pertinent labs & imaging results that were available during my care of the patient were reviewed by me and considered in my medical decision making (see chart for details).    Plan: The diagnosis will be treated with the following: 1.  Upper respiratory infection: A.  Promethazine DM, 1 teaspoon every 8 hours for cough or congestion. 2.  Cough: A.  Promethazine DM, 1 teaspoon every 8 hours for  cough and congestion. 3.  Advised follow-up PCP or return to urgent care as needed. Final Clinical Impressions(s) / UC Diagnoses   Final diagnoses:  Acute upper respiratory infection  Acute cough     Discharge Instructions      Advised to take the Promethazine DM every 8 hours as needed for cough and congestion.  (Be cautious with this medication as it does make some people drowsy)  Advised to increase fluid intake while you are taking the cough preparation.  Advised take Tylenol or ibuprofen if needed for discomfort.  Advised follow-up PCP or return to urgent care as needed.    ED Prescriptions     Medication Sig Dispense Auth. Provider   promethazine-dextromethorphan (PROMETHAZINE-DM) 6.25-15 MG/5ML syrup Take 5 mLs by mouth 3 (three) times daily. 118 mL Nyoka Lint, PA-C      PDMP not reviewed this encounter.   Lelon, Ikard, PA-C 08/14/22 1048

## 2022-08-14 NOTE — Discharge Instructions (Signed)
Advised to take the Promethazine DM every 8 hours as needed for cough and congestion.  (Be cautious with this medication as it does make some people drowsy)  Advised to increase fluid intake while you are taking the cough preparation.  Advised take Tylenol or ibuprofen if needed for discomfort.  Advised follow-up PCP or return to urgent care as needed.

## 2022-08-14 NOTE — ED Triage Notes (Signed)
Pt states that he has some nasal congestion and a cough. x1week

## 2022-08-21 ENCOUNTER — Other Ambulatory Visit (HOSPITAL_COMMUNITY): Payer: Self-pay

## 2022-08-21 ENCOUNTER — Inpatient Hospital Stay: Payer: Commercial Managed Care - PPO | Attending: Hematology & Oncology

## 2022-08-21 ENCOUNTER — Encounter: Payer: Self-pay | Admitting: Hematology & Oncology

## 2022-08-21 ENCOUNTER — Other Ambulatory Visit: Payer: Self-pay

## 2022-08-21 ENCOUNTER — Inpatient Hospital Stay (HOSPITAL_BASED_OUTPATIENT_CLINIC_OR_DEPARTMENT_OTHER): Payer: Commercial Managed Care - PPO | Admitting: Hematology & Oncology

## 2022-08-21 VITALS — BP 120/65 | HR 56 | Temp 98.8°F | Resp 18 | Ht 68.0 in | Wt 245.0 lb

## 2022-08-21 DIAGNOSIS — C9111 Chronic lymphocytic leukemia of B-cell type in remission: Secondary | ICD-10-CM | POA: Diagnosis not present

## 2022-08-21 DIAGNOSIS — C9110 Chronic lymphocytic leukemia of B-cell type not having achieved remission: Secondary | ICD-10-CM | POA: Diagnosis not present

## 2022-08-21 LAB — CBC WITH DIFFERENTIAL (CANCER CENTER ONLY)
Abs Immature Granulocytes: 0.04 10*3/uL (ref 0.00–0.07)
Basophils Absolute: 0.1 10*3/uL (ref 0.0–0.1)
Basophils Relative: 1 %
Eosinophils Absolute: 0.2 10*3/uL (ref 0.0–0.5)
Eosinophils Relative: 2 %
HCT: 45.7 % (ref 39.0–52.0)
Hemoglobin: 15.4 g/dL (ref 13.0–17.0)
Immature Granulocytes: 0 %
Lymphocytes Relative: 49 %
Lymphs Abs: 4.6 10*3/uL — ABNORMAL HIGH (ref 0.7–4.0)
MCH: 31.8 pg (ref 26.0–34.0)
MCHC: 33.7 g/dL (ref 30.0–36.0)
MCV: 94.2 fL (ref 80.0–100.0)
Monocytes Absolute: 0.5 10*3/uL (ref 0.1–1.0)
Monocytes Relative: 5 %
Neutro Abs: 4 10*3/uL (ref 1.7–7.7)
Neutrophils Relative %: 43 %
Platelet Count: 213 10*3/uL (ref 150–400)
RBC: 4.85 MIL/uL (ref 4.22–5.81)
RDW: 12.6 % (ref 11.5–15.5)
WBC Count: 9.4 10*3/uL (ref 4.0–10.5)
nRBC: 0 % (ref 0.0–0.2)

## 2022-08-21 LAB — CMP (CANCER CENTER ONLY)
ALT: 9 U/L (ref 0–44)
AST: 15 U/L (ref 15–41)
Albumin: 4.5 g/dL (ref 3.5–5.0)
Alkaline Phosphatase: 72 U/L (ref 38–126)
Anion gap: 9 (ref 5–15)
BUN: 14 mg/dL (ref 8–23)
CO2: 31 mmol/L (ref 22–32)
Calcium: 9.9 mg/dL (ref 8.9–10.3)
Chloride: 99 mmol/L (ref 98–111)
Creatinine: 1.13 mg/dL (ref 0.61–1.24)
GFR, Estimated: >60 mL/min (ref >60)
Glucose, Bld: 144 mg/dL — ABNORMAL HIGH (ref 70–99)
Potassium: 4.1 mmol/L (ref 3.5–5.1)
Sodium: 139 mmol/L (ref 135–145)
Total Bilirubin: 0.6 mg/dL (ref 0.3–1.2)
Total Protein: 6.9 g/dL (ref 6.5–8.1)

## 2022-08-21 LAB — SAVE SMEAR(SSMR), FOR PROVIDER SLIDE REVIEW

## 2022-08-21 LAB — LACTATE DEHYDROGENASE: LDH: 146 U/L (ref 98–192)

## 2022-08-21 NOTE — Progress Notes (Signed)
Hematology and Oncology Follow Up Visit  Charles [name] [id number] 1956-05-26 67 y.o. 08/21/2022   Principle Diagnosis:  Chronic lymphocytic leukemia (Trisomy 12) - stage C - remission  Current Therapy:   Observation   Interim History:  Charles [name] is here today for follow-up.  I see him once a year and has been doing okay.  He actually had back surgery in November.  He had a lower back fusion.  He had a couple discs removed.  He really looks quite good.  He was just in the hospital overnight.  He has had no problems with infections.  He has had no swollen lymph nodes.  He has had no cough or shortness of breath.  Thankfully, there is been no problems with COVID.  He has had no obvious change in bowel or bladder habits.  There has been no nausea or vomiting.  He has had no leg swelling.  Overall, I would say his performance status is ECOG 1.    Medications:  Allergies as of 08/21/2022   No Known Allergies      Medication List        Accurate as of August 21, 2022 11:39 AM. If you have any questions, ask your nurse or doctor.          acetaminophen 500 MG tablet Commonly known as: TYLENOL Take 1,000-1,500 mg by mouth daily.   Alpha-Lipoic Acid 600 MG Caps Take 600 mg by mouth 2 (two) times daily.   Aspirin Low Dose 81 MG tablet Generic drug: aspirin EC Take 1 tablet (81 mg total) by mouth daily.   cetirizine 10 MG tablet Commonly known as: ZYRTEC Take 10 mg by mouth daily.   esomeprazole 40 MG capsule Commonly known as: NEXIUM Take 1 capsule (40 mg total) by mouth daily.   fluticasone 50 MCG/ACT nasal spray Commonly known as: FLONASE Place 1 spray into both nostrils daily. What changed:  when to take this reasons to take this   ketoconazole 2 % cream Commonly known as: NIZORAL Apply topically 2 (two) times daily. What changed:  how much to take when to take this reasons to take this   lisinopril-hydrochlorothiazide 20-25 MG tablet Commonly  known as: ZESTORETIC Take 1 tablet by mouth daily.   meloxicam 15 MG tablet Commonly known as: MOBIC Take 1 tablet (15 mg total) by mouth daily.   methocarbamol 500 MG tablet Commonly known as: ROBAXIN Take 1 tablet (500 mg total) by mouth every 6 (six) hours as needed for muscle spasms.   Oxycodone HCl 10 MG Tabs Take 1 tablet (10 mg total) by mouth every 4-6 hours.   promethazine-dextromethorphan 6.25-15 MG/5ML syrup Commonly known as: PROMETHAZINE-DM Take 5 mLs by mouth 3 (three) times daily.   rosuvastatin 10 MG tablet Commonly known as: CRESTOR Take 1 tablet (10 mg total) by mouth daily.   Vitamin D3 50 MCG (2000 UT) Tabs Take 2,000 Units by mouth.        Allergies: No Known Allergies  Past Medical History, Surgical history, Social history, and Family History were reviewed and updated.  Review of Systems: Review of Systems  Constitutional: Negative.   HENT: Negative.    Eyes: Negative.   Cardiovascular: Negative.   Gastrointestinal: Negative.   Genitourinary: Negative.   Musculoskeletal: Negative.   Skin: Negative.   Neurological: Negative.   Endo/Heme/Allergies: Negative.   Psychiatric/Behavioral: Negative.       Physical Exam:  height is '5\' 8"'$  (1.727 m) and weight is 245 lb (  111.1 kg). His oral temperature is 98.8 F (37.1 C). His blood pressure is 120/65 and his pulse is 56 (abnormal). His respiration is 18 and oxygen saturation is 97%.   Wt Readings from Last 3 Encounters:  08/21/22 245 lb (111.1 kg)  08/14/22 240 lb (108.9 kg)  06/29/22 240 lb (108.9 kg)    Physical Exam Vitals reviewed.  HENT:     Head: Normocephalic and atraumatic.  Eyes:     Pupils: Pupils are equal, round, and reactive to light.  Cardiovascular:     Rate and Rhythm: Normal rate and regular rhythm.     Heart sounds: Normal heart sounds.  Pulmonary:     Effort: Pulmonary effort is normal.     Breath sounds: Normal breath sounds.  Abdominal:     General: Bowel sounds  are normal.     Palpations: Abdomen is soft.  Musculoskeletal:        General: No tenderness or deformity. Normal range of motion.     Cervical back: Normal range of motion.  Lymphadenopathy:     Cervical: No cervical adenopathy.  Skin:    General: Skin is warm and dry.     Findings: No erythema or rash.  Neurological:     Mental Status: He is alert and oriented to person, place, and time.  Psychiatric:        Behavior: Behavior normal.        Thought Content: Thought content normal.        Judgment: Judgment normal.       Lab Results  Component Value Date   WBC 9.4 08/21/2022   HGB 15.4 08/21/2022   HCT 45.7 08/21/2022   MCV 94.2 08/21/2022   PLT 213 08/21/2022   Lab Results  Component Value Date   FERRITIN 301.5 05/23/2020   Lab Results  Component Value Date   RETICCTPCT 1.8 01/31/2011   RBC 4.85 08/21/2022   RETICCTABS 79.6 01/31/2011   Lab Results  Component Value Date   KPAFRELGTCHN 21.8 (H) 02/10/2021   LAMBDASER 13.5 02/10/2021   KAPLAMBRATIO 1.61 02/10/2021   Lab Results  Component Value Date   IGGSERUM 726 04/11/2022   IGA 148 08/21/2021   IGMSERUM 70 04/11/2022   Lab Results  Component Value Date   TOTALPROTELP 6.8 02/10/2021   ALBUMINELP 4.2 02/10/2021   A1GS 0.2 02/10/2021   A2GS 0.8 02/10/2021   BETS 0.9 02/10/2021   BETA2SER 4.1 04/23/2013   GAMS 0.7 02/10/2021   MSPIKE Not Observed 02/10/2021   SPEI * 04/23/2013     Chemistry      Component Value Date/Time   NA 139 08/21/2022 1033   NA 140 08/05/2017 1115   NA 139 06/26/2016 1000   K 4.1 08/21/2022 1033   K 4.4 06/26/2016 1000   CL 99 08/21/2022 1033   CL 102 04/10/2012 0815   CO2 31 08/21/2022 1033   CO2 27 06/26/2016 1000   BUN 14 08/21/2022 1033   BUN 16 08/05/2017 1115   BUN 13.5 06/26/2016 1000   CREATININE 1.13 08/21/2022 1033   CREATININE 1.1 06/26/2016 1000      Component Value Date/Time   CALCIUM 9.9 08/21/2022 1033   CALCIUM 9.5 06/26/2016 1000   ALKPHOS 72  08/21/2022 1033   ALKPHOS 83 06/26/2016 1000   AST 15 08/21/2022 1033   AST 20 06/26/2016 1000   ALT 9 08/21/2022 1033   ALT 19 06/26/2016 1000   BILITOT 0.6 08/21/2022 1033   BILITOT 0.69 06/26/2016  1000      Impression and Plan: Charles [name] is a very pleasant 67 yo caucasian male with CLL.   He completed 6 cycles of chemotherapy with Rituxan/Treanda in February 2014. So far, he has done well and there has been no evidence of recurrence.   His white cell count is normal.  Everything is looking good with his lymphocytes.  Everything really looks quite stable.  As such, we will plan to get him back in 1 year.  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    Volanda Napoleon, MD 1/30/202411:39 AM

## 2022-08-23 LAB — IGG, IGA, IGM
IgA: 129 mg/dL (ref 61–437)
IgG (Immunoglobin G), Serum: 735 mg/dL (ref 603–1613)
IgM (Immunoglobulin M), Srm: 70 mg/dL (ref 20–172)

## 2022-09-24 ENCOUNTER — Other Ambulatory Visit (HOSPITAL_COMMUNITY): Payer: Self-pay

## 2022-09-25 ENCOUNTER — Other Ambulatory Visit (HOSPITAL_COMMUNITY): Payer: Self-pay

## 2022-09-25 MED ORDER — ROSUVASTATIN CALCIUM 10 MG PO TABS
10.0000 mg | ORAL_TABLET | Freq: Every day | ORAL | 1 refills | Status: DC
  Filled 2022-09-25: qty 90, 90d supply, fill #0

## 2022-09-25 MED ORDER — ESOMEPRAZOLE MAGNESIUM 40 MG PO CPDR
40.0000 mg | DELAYED_RELEASE_CAPSULE | Freq: Every day | ORAL | 3 refills | Status: DC
  Filled 2022-09-25: qty 90, 90d supply, fill #0
  Filled 2022-12-20: qty 90, 90d supply, fill #1
  Filled 2023-03-26: qty 90, 90d supply, fill #2
  Filled 2023-07-01: qty 90, 90d supply, fill #3

## 2022-10-08 ENCOUNTER — Other Ambulatory Visit: Payer: Self-pay

## 2022-10-08 ENCOUNTER — Other Ambulatory Visit (HOSPITAL_COMMUNITY): Payer: Self-pay

## 2022-10-09 ENCOUNTER — Other Ambulatory Visit (HOSPITAL_COMMUNITY): Payer: Self-pay

## 2022-10-15 ENCOUNTER — Other Ambulatory Visit (HOSPITAL_COMMUNITY): Payer: Self-pay

## 2022-10-15 MED ORDER — ROSUVASTATIN CALCIUM 20 MG PO TABS
20.0000 mg | ORAL_TABLET | Freq: Every evening | ORAL | 3 refills | Status: DC
  Filled 2022-10-15: qty 90, 90d supply, fill #0
  Filled 2023-01-10: qty 90, 90d supply, fill #1
  Filled 2023-05-28: qty 90, 90d supply, fill #2
  Filled 2023-08-19: qty 90, 90d supply, fill #3

## 2022-10-29 ENCOUNTER — Other Ambulatory Visit (HOSPITAL_COMMUNITY): Payer: Self-pay

## 2022-10-29 MED ORDER — KETOCONAZOLE 2 % EX CREA
TOPICAL_CREAM | Freq: Two times a day (BID) | CUTANEOUS | 3 refills | Status: DC
  Filled 2022-10-29: qty 60, 30d supply, fill #0
  Filled 2022-12-20: qty 60, 30d supply, fill #1
  Filled 2023-02-05: qty 60, 30d supply, fill #2
  Filled 2023-03-26: qty 60, 30d supply, fill #3

## 2022-11-05 ENCOUNTER — Other Ambulatory Visit (HOSPITAL_COMMUNITY): Payer: Self-pay

## 2022-11-05 ENCOUNTER — Other Ambulatory Visit: Payer: Self-pay

## 2022-11-05 MED ORDER — METHOCARBAMOL 750 MG PO TABS
750.0000 mg | ORAL_TABLET | Freq: Three times a day (TID) | ORAL | 2 refills | Status: DC | PRN
  Filled 2022-11-05: qty 90, 30d supply, fill #0
  Filled 2022-12-24: qty 90, 30d supply, fill #1
  Filled 2023-10-13: qty 90, 30d supply, fill #2

## 2022-11-06 ENCOUNTER — Other Ambulatory Visit (HOSPITAL_COMMUNITY): Payer: Self-pay

## 2022-11-06 MED ORDER — MELOXICAM 15 MG PO TABS
15.0000 mg | ORAL_TABLET | Freq: Every day | ORAL | 0 refills | Status: DC
  Filled 2022-11-06 – 2023-02-05 (×6): qty 90, 90d supply, fill #0

## 2022-11-12 ENCOUNTER — Other Ambulatory Visit (HOSPITAL_COMMUNITY): Payer: Self-pay

## 2022-11-14 ENCOUNTER — Other Ambulatory Visit (HOSPITAL_COMMUNITY): Payer: Self-pay

## 2022-11-14 ENCOUNTER — Encounter: Payer: Self-pay | Admitting: Family Medicine

## 2022-11-14 ENCOUNTER — Ambulatory Visit: Payer: Commercial Managed Care - PPO | Admitting: Family Medicine

## 2022-11-14 VITALS — BP 132/57 | HR 55 | Ht 68.0 in | Wt 253.0 lb

## 2022-11-14 DIAGNOSIS — J302 Other seasonal allergic rhinitis: Secondary | ICD-10-CM | POA: Insufficient documentation

## 2022-11-14 DIAGNOSIS — R7303 Prediabetes: Secondary | ICD-10-CM

## 2022-11-14 DIAGNOSIS — Z0000 Encounter for general adult medical examination without abnormal findings: Secondary | ICD-10-CM

## 2022-11-14 DIAGNOSIS — E785 Hyperlipidemia, unspecified: Secondary | ICD-10-CM

## 2022-11-14 DIAGNOSIS — I10 Essential (primary) hypertension: Secondary | ICD-10-CM

## 2022-11-14 DIAGNOSIS — C9111 Chronic lymphocytic leukemia of B-cell type in remission: Secondary | ICD-10-CM | POA: Diagnosis not present

## 2022-11-14 DIAGNOSIS — T7840XA Allergy, unspecified, initial encounter: Secondary | ICD-10-CM | POA: Insufficient documentation

## 2022-11-14 DIAGNOSIS — C9110 Chronic lymphocytic leukemia of B-cell type not having achieved remission: Secondary | ICD-10-CM

## 2022-11-14 NOTE — Assessment & Plan Note (Signed)
Lab Results  Component Value Date   HGBA1C 6.1 (H) 08/05/2017   Encouraged heart healthy, low-carb diet, regular exercise, weight management.

## 2022-11-14 NOTE — Assessment & Plan Note (Signed)
Remission. No acute symptoms. Following annually with Dr. Myna Hidalgo

## 2022-11-14 NOTE — Progress Notes (Signed)
New Patient Office Visit  Subjective    Patient ID: Charles [name], male    DOB: 1955/10/03  Age: 67 y.o. MRN: [id number]  CC:  Chief Complaint  Patient presents with   Establish Care    HPI Charles [name] presents to establish care. He is working full-time as Charles [name] for 17 years.    Hypertension: - Medications: lisinopril-hctz 20-25 mg daily - Compliance: good - Checking BP at home: good usually - Denies any SOB, recurrent headaches, CP, vision changes, LE edema, dizziness, palpitations, or medication side effects. - Diet: general - Exercise: walking 6-10k steps per day   Hyperlipidemia: - medications: Crestor 20 mg daily  - compliance: good - medication SEs: none The 10-year ASCVD risk score (Arnett DK, et al., 2019) is: 20.3%   Values used to calculate the score:     Age: 66 years     Sex: Male     Is Non-Hispanic African American: No     Diabetic: No     Tobacco smoker: No     Systolic Blood Pressure: 132 mmHg     Is BP treated: Yes     HDL Cholesterol: 30 mg/dL     Total Cholesterol: 143 mg/dL  Cancer: - CLL; follows with [facility] Dr. Myna [name] (annually) - Dx 2013, Chemo 2014, now in remission  Seasonal allergies: - usually Spring and Fall - Flonase, Zyrtec - well controlled   Prediabetes: - A1c last month 6.2%; tries to avoid excess carbs, cut out soda - Denies symptoms of hypoglycemia, polyuria, polydipsia, numbness extremities, foot ulcers/trauma, wounds that are not healing, medication side effects    Sees Dr. Lovenia [name] for dermatology. History of skin cancer (basal cell and melanoma).   November 2023 back surgery for spinal stenosis (Dr. Jake [name], [facility]) - has been doing well and back at work since January. Still needs PRN Robaxin and very rarely takes his leftover oxycodone, tylenol usually works well. He is taking Meloxicam daily for plantar fasciitis.     Outpatient Encounter Medications as of 11/14/2022   Medication Sig   acetaminophen (TYLENOL) 500 MG tablet Take 1,000-1,500 mg by mouth daily.   Alpha-Lipoic Acid 600 MG CAPS Take 600 mg by mouth 2 (two) times daily.   aspirin EC (ASPIRIN LOW DOSE) 81 MG tablet Take 1 tablet (81 mg total) by mouth daily.   cetirizine (ZYRTEC) 10 MG tablet Take 10 mg by mouth daily.   Cholecalciferol (VITAMIN D3) 2000 UNITS TABS Take 2,000 Units by mouth.   esomeprazole (NEXIUM) 40 MG capsule Take 1 capsule (40 mg total) by mouth daily.   fluticasone (FLONASE) 50 MCG/ACT nasal spray Place 1 spray into both nostrils daily. (Patient taking differently: Place 1 spray into both nostrils daily as needed for rhinitis.)   ketoconazole (NIZORAL) 2 % cream Apply topically 2 (two) times daily.   lisinopril-hydrochlorothiazide (ZESTORETIC) 20-25 MG tablet Take 1 tablet by mouth daily.   meloxicam (MOBIC) 15 MG tablet Take 1 tablet (15 mg total) by mouth daily.   methocarbamol (ROBAXIN) 750 MG tablet Take 1 tablet (750 mg total) by mouth 3 (three) times daily as needed for muscle spasms   Oxycodone HCl 10 MG TABS Take 1 tablet (10 mg total) by mouth every 4-6 hours.   promethazine-dextromethorphan (PROMETHAZINE-DM) 6.25-15 MG/5ML syrup Take 5 mLs by mouth 3 (three) times daily.   rosuvastatin (CRESTOR) 20 MG tablet Take one tablet (20 mg dose) by mouth at bedtime.   [DISCONTINUED] ketoconazole (  NIZORAL) 2 % cream Apply topically 2 (two) times daily. (Patient taking differently: Apply 1 Application topically daily as needed (Itching).)   [DISCONTINUED] methocarbamol (ROBAXIN) 500 MG tablet Take 1 tablet (500 mg total) by mouth every 6 (six) hours as needed for muscle spasms.   No facility-administered encounter medications on file as of 11/14/2022.    Past Medical History:  Diagnosis Date   Acute medial meniscus tear of left knee 12/24/2019   Allergy    seasonal   Arthritis    CLL (chronic lymphoblastic leukemia) 08/03/2011   CLL (chronic lymphocytic leukemia)  03/18/2012   GERD (gastroesophageal reflux disease)    Hyperlipidemia    Hypertension    Shingles    Ulcer     Past Surgical History:  Procedure Laterality Date   HAND SURGERY  1985   HERNIA REPAIR  1990   KNEE ARTHROSCOPY WITH LATERAL MENISECTOMY Left 12/24/2019   Procedure: KNEE ARTHROSCOPY WITH PARTIAL LATERAL MENISECTOMY;  Surgeon: Charles Hitch, MD;  Location: [facility];  Service: Orthopedics;  Laterality: Left;   KNEE SURGERY Right 2005   SHOULDER SURGERY Left 2007   TRANSFORAMINAL LUMBAR INTERBODY FUSION W/ MIS 2 LEVEL Right 06/05/2022   Procedure: MINIMALLY INVASIVE DECOMPRESSION ,TRANSFORAMINAL LUMBAR INTERBODY FUSION, RIGHT SIDED APPROACH LUMBAR THRE-FOUR, LUMBAR FOUR-FIVE;  Surgeon: [name], Charles Mulder, DO;  Location: [facility];  Service: Neurosurgery;  Laterality: Right;   WRIST SURGERY Right     Family History  Problem Relation Age of Onset   Diabetes Mother    Hyperlipidemia Father    Hearing loss Father    Cancer Sister    Breast cancer Sister    Melanoma Sister    Cancer Brother 68       prostate cancer stage 4   Cancer Brother 71       prostate   Cancer Brother    Cancer - Colon Brother    Colon cancer Brother    Neuropathy Neg Hx     Social History   Socioeconomic History   Marital status: Married    Spouse name: Charles [name]   Number of children: 2   Years of education: Not on file   Highest education level: Not on file  Occupational History   Occupation: [employer]  Tobacco Use   Smoking status: Former    Packs/day: 2.00    Years: 20.00    Additional pack years: 0.00    Total pack years: 40.00    Types: Cigarettes    Start date: 08/24/1978    Quit date: 08/24/1998    Years since quitting: 24.2   Smokeless tobacco: Never   Tobacco comments:    quit smoking 15 years ago  Vaping Use   Vaping Use: Never used  Substance and Sexual Activity   Alcohol use: Yes    Comment: 1-2 beers a month   Drug use: No   Sexual activity:  Not Currently    Partners: Female    Birth control/protection: None  Other Topics Concern   Not on file  Social History Narrative   Married. Education: [employer]. Exercise: Walk    Education: [employer]   Works for [employer] in [employer] Strain: Not on [employer] Insecurity: Not on file  Transportation Needs: Not on file  Physical Activity: Not on file  Stress: Not on file  Social Connections: Not on file  Intimate Partner Violence: Not on file    ROS  All review of systems negative except what is listed in the HPI      Objective    BP (!) 132/57   Pulse (!) 55   Ht  (1.727 m)   Wt 253 lb (114.8 kg)   SpO2 98%   BMI 38.47 kg/m   Physical Exam Vitals reviewed.  Constitutional:      General: He is not in acute distress.    Appearance: Normal appearance. He is obese. He is not ill-appearing.  Cardiovascular:     Rate and Rhythm: Normal rate and regular rhythm.     Pulses: Normal pulses.     Heart sounds: Normal heart sounds.  Pulmonary:     Effort: Pulmonary effort is normal.     Breath sounds: Normal breath sounds.  Skin:    General: Skin is warm and dry.  Neurological:     Mental Status: He is alert and oriented to person, place, and time.  Psychiatric:        Mood and Affect: Mood normal.        Behavior: Behavior normal.        Thought Content: Thought content normal.        Judgment: Judgment normal.         Assessment & Plan:   Problem List Items Addressed This Visit     CLL (chronic lymphocytic leukemia) (Chronic)    Remission. No acute symptoms. Following annually with Dr. Myna [name]      Pre-diabetes    Lab Results  Component Value Date   HGBA1C 6.1 (H) 08/05/2017  Encouraged heart healthy, low-carb diet, regular exercise, weight management.      Hypertension - Primary (Chronic)    Blood pressure is at goal for age and co-morbidities.   Recommendations: continue lisinopril-hctz 20-25  mg daily - BP goal <130/80 - monitor and log blood pressures at home - check around the same time each day in a relaxed setting - Limit salt to <2000 mg/day - Follow DASH eating plan (heart healthy diet) - limit alcohol to 2 standard drinks per day for men and 1 per day for women - avoid tobacco products - get at least 2 hours of regular aerobic exercise weekly Patient aware of signs/symptoms requiring further/urgent evaluation. Declined labs today        Hyperlipidemia    Medication management: Continue Crestor 20 mg daily Lifestyle factors for lowering cholesterol include: Diet therapy - heart-healthy diet rich in fruits, veggies, fiber-rich whole grains, lean meats, chicken, fish (at least twice a week), fat-free or 1% dairy products; foods low in saturated/trans fats, cholesterol, sodium, and sugar. Mediterranean diet has shown to be very heart healthy. Regular exercise - recommend at least 30 minutes a day, 5 times per week Weight management         Other Visit Diagnoses     Encounter for medical examination to establish care           Return in about 6 months (around 05/16/2023) for routine follow-up.   Clayborne Dana, NP

## 2022-11-14 NOTE — Assessment & Plan Note (Signed)
Blood pressure is at goal for age and co-morbidities.   Recommendations: continue lisinopril-hctz 20-25 mg daily - BP goal <130/80 - monitor and log blood pressures at home - check around the same time each day in a relaxed setting - Limit salt to <2000 mg/day - Follow DASH eating plan (heart healthy diet) - limit alcohol to 2 standard drinks per day for men and 1 per day for women - avoid tobacco products - get at least 2 hours of regular aerobic exercise weekly Patient aware of signs/symptoms requiring further/urgent evaluation. Declined labs today

## 2022-11-14 NOTE — Patient Instructions (Signed)
Thank you for choosing [facility] at [facility] for your Primary Care needs. I am excited for the opportunity to partner with you to meet your health care goals. It was a pleasure meeting you today!  Information on diet, exercise, and health maintenance recommendations are listed below. This is information to help you be sure you are on track for optimal health and monitoring.   Please look over this and let us know if you have any questions or if you have completed any of the health maintenance outside of [place] so that we can be sure your records are up to date.  ___________________________________________________________  MyChart:  For all urgent or time sensitive needs we ask that you please call the office to avoid delays. Our number is [phone]. MyChart is not constantly monitored and due to the large volume of messages a day, replies may take up to 72 business hours.  MyChart Policy: MyChart allows for you to see your visit notes, after visit summary, provider recommendations, lab and tests results, make an appointment, request refills, and contact your provider or the office for non-urgent questions or concerns. Providers are seeing patients during normal business hours and do not have built in time to review MyChart messages.  We ask that you allow a minimum of 3 business days for responses to MyChart messages. For this reason, please do not send urgent requests through MyChart. Please call the office at [phone]. New and ongoing conditions may require a visit. We have virtual and in-person visits available for your convenience.  Complex MyChart concerns may require a visit. Your provider may request you schedule a virtual or in-person visit to ensure we are providing the best care possible. MyChart messages sent after 11:00 AM on Friday will not be received by the provider until Monday morning.    Lab and Test Results: You will receive your lab and test  results on MyChart as soon as they are completed and results have been sent by the lab or testing facility. Due to this service, you will receive your results BEFORE your provider.  I review lab and test results each morning prior to seeing patients. Some results require collaboration with other providers to ensure you are receiving the most appropriate care. For this reason, we ask that you please allow a minimum of 3-5 business days from the time that ALL results have been received for your provider to receive and review lab and test results and contact you about these.  Most lab and test result comments from the provider will be sent through MyChart. Your provider may recommend changes to the plan of care, follow-up visits, repeat testing, ask questions, or request an office visit to discuss these results. You may reply directly to this message or call the office to provide information for the provider or set up an appointment. In some instances, you will be called with test results and recommendations. Please let us know if this is preferred and we will make note of this in your chart to provide this for you.    If you have not heard a response to your lab or test results in 5 business days from all results returning to MyChart, please call the office to let us know. We ask that you please avoid calling prior to this time unless there is an emergent concern. Due to high call volumes, this can delay the resulting process.  After Hours: For all non-emergency after hours needs, please   call the office at [phone] and select the option to reach the on-call  service. On-call services are shared between multiple [place] offices and therefore it will not be possible to speak directly with your provider. On-call providers may provide medical advice and recommendations, but are unable to provide refills for maintenance medications.  For all emergency or urgent medical needs after normal business hours, we  recommend that you seek care at the closest Urgent Care or Emergency Department to ensure appropriate treatment in a timely manner.  [facility] has a 24 hour emergency room located on the ground floor for your convenience.   Urgent Concerns During the Business Day Providers are seeing patients from 8AM to 5PM with a busy schedule and are most often not able to respond to non-urgent calls until the end of the day or the next business day. If you should have URGENT concerns during the day, please call and speak to the nurse or schedule a same day appointment so that we can address your concern without delay.   Thank you, again, for choosing me as your health care partner. I appreciate your trust and look forward to learning more about you!   Emorie Mcfate B. Chalee Hirota, DNP, FNP-C  ___________________________________________________________  Health Maintenance Recommendations Screening Testing Mammogram Every 1-2 years based on history and risk factors Starting at age 50 Pap Smear Ages 21-39 every 3 years Ages 30-65 every 5 years with HPV testing More frequent testing may be required based on results and history Colon Cancer Screening Every 1-10 years based on test performed, risk factors, and history Starting at age 45 Bone Density Screening Every 2-10 years based on history Starting at age 65 for women Recommendations for men differ based on medication usage, history, and risk factors AAA Screening One time ultrasound Men 65-75 years old who have ever smoked Lung Cancer Screening Low Dose Lung CT every 12 months Age 50-80 years with a 20 pack-year smoking history who still smoke or who have quit within the last 15 years  Screening Labs Routine  Labs: Complete Blood Count (CBC), Complete Metabolic Panel (CMP), Cholesterol (Lipid Panel) Every 6-12 months based on history and medications May be recommended more frequently based on current conditions or previous results Hemoglobin  A1c Lab Every 3-12 months based on history and previous results Starting at age 45 or earlier with diagnosis of diabetes, high cholesterol, BMI >26, and/or risk factors Frequent monitoring for patients with diabetes to ensure blood sugar control Thyroid Panel  Every 6 months based on history, symptoms, and risk factors May be repeated more often if on medication HIV One time testing for all patients 13 and older May be repeated more frequently for patients with increased risk factors or exposure Hepatitis C One time testing for all patients 18 and older May be repeated more frequently for patients with increased risk factors or exposure Gonorrhea, Chlamydia Every 12 months for all sexually active persons 13-24 years Additional monitoring may be recommended for those who are considered high risk or who have symptoms PSA Men 40-54 years old with risk factors Additional screening may be recommended from age 55-69 based on risk factors, symptoms, and history  Vaccine Recommendations Tetanus Booster All adults every 10 years Flu Vaccine All patients 6 months and older every year COVID Vaccine All patients 12 years and older Initial dosing with booster May recommend additional booster based on age and health history HPV Vaccine 2 doses all patients age 9-26 Dosing may be considered   for patients over 26 Shingles Vaccine (Shingrix) 2 doses all adults 50 years and older Pneumonia (Pneumovax 23) All adults 65 years and older May recommend earlier dosing based on health history Pneumonia (Prevnar 13) All adults 65 years and older Dosed 1 year after Pneumovax 23 Pneumonia (Prevnar 20) All adults 65 years and older (adults 19-64 with certain conditions or risk factors) 1 dose  For those who have not received Prevnar 13 vaccine previously   Additional Screening, Testing, and Vaccinations may be recommended on an individualized basis based on family history, health history, risk  factors, and/or exposure.  __________________________________________________________  Diet Recommendations for All Patients  I recommend that all patients maintain a diet low in saturated fats, carbohydrates, and cholesterol. While this can be challenging at first, it is not impossible and small changes can make big differences.  Things to try: Decreasing the amount of soda, sweet tea, and/or juice to one or less per day and replace with water While water is always the first choice, if you do not like water you may consider adding a water additive without sugar to improve the taste other sugar free drinks Replace potatoes with a brightly colored vegetable  Use healthy oils, such as canola oil or olive oil, instead of butter or hard margarine Limit your bread intake to two pieces or less a day Replace regular pasta with low carb pasta options Bake, broil, or grill foods instead of frying Monitor portion sizes  Eat smaller, more frequent meals throughout the day instead of large meals  An important thing to remember is, if you love foods that are not great for your health, you don't have to give them up completely. Instead, allow these foods to be a reward when you have done well. Allowing yourself to still have special treats every once in a while is a nice way to tell yourself thank you for working hard to keep yourself healthy.   Also remember that every day is a new day. If you have a bad day and "fall off the wagon", you can still climb right back up and keep moving along on your journey!  We have resources available to help you!  Some websites that may be helpful include: www.MyPlate.gov  Www.VeryWellFit.com _____________________________________________________________  Activity Recommendations for All Patients  I recommend that all adults get at least 20 minutes of moderate physical activity that elevates your heart rate at least 5 days out of the week.  Some examples  include: Walking or jogging at a pace that allows you to carry on a conversation Cycling (stationary bike or outdoors) Water aerobics Yoga Weight lifting Dancing If physical limitations prevent you from putting stress on your joints, exercise in a pool or seated in a chair are excellent options.  Do determine your MAXIMUM heart rate for activity: 220 - YOUR AGE = MAX Heart Rate   Remember! Do not push yourself too hard.  Start slowly and build up your pace, speed, weight, time in exercise, etc.  Allow your body to rest between exercise and get good sleep. You will need more water than normal when you are exerting yourself. Do not wait until you are thirsty to drink. Drink with a purpose of getting in at least 8, 8 ounce glasses of water a day plus more depending on how much you exercise and sweat.    If you begin to develop dizziness, chest pain, abdominal pain, jaw pain, shortness of breath, headache, vision changes, lightheadedness, or other concerning symptoms,   stop the activity and allow your body to rest. If your symptoms are severe, seek emergency evaluation immediately. If your symptoms are concerning, but not severe, please let us know so that we can recommend further evaluation.     

## 2022-11-14 NOTE — Assessment & Plan Note (Signed)
Medication management: Continue Crestor 20 mg daily Lifestyle factors for lowering cholesterol include: Diet therapy - heart-healthy diet rich in fruits, veggies, fiber-rich whole grains, lean meats, chicken, fish (at least twice a week), fat-free or 1% dairy products; foods low in saturated/trans fats, cholesterol, sodium, and sugar. Mediterranean diet has shown to be very heart healthy. Regular exercise - recommend at least 30 minutes a day, 5 times per week Weight management

## 2022-11-27 ENCOUNTER — Other Ambulatory Visit (HOSPITAL_COMMUNITY): Payer: Self-pay

## 2022-11-28 DIAGNOSIS — M4316 Spondylolisthesis, lumbar region: Secondary | ICD-10-CM | POA: Diagnosis not present

## 2022-11-28 DIAGNOSIS — M48062 Spinal stenosis, lumbar region with neurogenic claudication: Secondary | ICD-10-CM | POA: Diagnosis not present

## 2022-12-03 NOTE — Telephone Encounter (Signed)
Received a fax from [facility] stating patient is s/p L3-5 laminectomy/TLIF in 05/2022 and he is doing well with no further radiculopathy. He is now going to follow-up PRN with neurosurgery. For pain control he is on Tylenol and Methocarbamol 750 mg TID PRN which is on his med list already.

## 2022-12-03 NOTE — Telephone Encounter (Signed)
What about his Carpal Tunnel Syndrome? Did he get that addressed as well?

## 2022-12-03 NOTE — Telephone Encounter (Signed)
Looks like I sent him to hand surgery for his CTS so that is fine, thanks

## 2022-12-20 ENCOUNTER — Other Ambulatory Visit (HOSPITAL_COMMUNITY): Payer: Self-pay

## 2022-12-20 ENCOUNTER — Other Ambulatory Visit: Payer: Self-pay

## 2022-12-21 ENCOUNTER — Other Ambulatory Visit: Payer: Self-pay

## 2022-12-24 ENCOUNTER — Other Ambulatory Visit (HOSPITAL_COMMUNITY): Payer: Self-pay

## 2022-12-24 ENCOUNTER — Other Ambulatory Visit: Payer: Self-pay

## 2022-12-24 MED ORDER — FLUTICASONE PROPIONATE 50 MCG/ACT NA SUSP
1.0000 | Freq: Every day | NASAL | 3 refills | Status: AC
  Filled 2022-12-24: qty 16, 60d supply, fill #0
  Filled 2022-12-24: qty 48, 90d supply, fill #0
  Filled 2023-09-03: qty 16, 60d supply, fill #1
  Filled 2023-12-05: qty 16, 60d supply, fill #2

## 2023-01-03 ENCOUNTER — Other Ambulatory Visit (HOSPITAL_COMMUNITY): Payer: Self-pay

## 2023-01-03 MED ORDER — AMOXICILLIN 500 MG PO CAPS
500.0000 mg | ORAL_CAPSULE | Freq: Three times a day (TID) | ORAL | 0 refills | Status: DC
  Filled 2023-01-03: qty 21, 7d supply, fill #0

## 2023-01-03 MED ORDER — CHLORHEXIDINE GLUCONATE 0.12 % MT SOLN
15.0000 mL | Freq: Two times a day (BID) | OROMUCOSAL | 3 refills | Status: DC
  Filled 2023-01-03: qty 473, 16d supply, fill #0

## 2023-01-10 ENCOUNTER — Other Ambulatory Visit (HOSPITAL_COMMUNITY): Payer: Self-pay

## 2023-01-10 ENCOUNTER — Other Ambulatory Visit: Payer: Self-pay

## 2023-01-11 ENCOUNTER — Other Ambulatory Visit (HOSPITAL_COMMUNITY): Payer: Self-pay

## 2023-01-15 ENCOUNTER — Other Ambulatory Visit (HOSPITAL_COMMUNITY): Payer: Self-pay

## 2023-01-15 MED ORDER — AMOXICILLIN 500 MG PO CAPS
500.0000 mg | ORAL_CAPSULE | Freq: Three times a day (TID) | ORAL | 0 refills | Status: DC
  Filled 2023-01-15: qty 21, 7d supply, fill #0

## 2023-02-05 ENCOUNTER — Other Ambulatory Visit (HOSPITAL_COMMUNITY): Payer: Self-pay

## 2023-02-05 ENCOUNTER — Other Ambulatory Visit: Payer: Self-pay

## 2023-02-05 MED ORDER — LISINOPRIL-HYDROCHLOROTHIAZIDE 20-25 MG PO TABS
1.0000 | ORAL_TABLET | Freq: Every day | ORAL | 1 refills | Status: DC
  Filled 2023-02-12: qty 90, 90d supply, fill #0

## 2023-02-12 ENCOUNTER — Other Ambulatory Visit: Payer: Self-pay

## 2023-02-12 ENCOUNTER — Other Ambulatory Visit (HOSPITAL_COMMUNITY): Payer: Self-pay

## 2023-04-10 DIAGNOSIS — D225 Melanocytic nevi of trunk: Secondary | ICD-10-CM | POA: Diagnosis not present

## 2023-04-10 DIAGNOSIS — D1801 Hemangioma of skin and subcutaneous tissue: Secondary | ICD-10-CM | POA: Diagnosis not present

## 2023-04-10 DIAGNOSIS — Z85820 Personal history of malignant melanoma of skin: Secondary | ICD-10-CM | POA: Diagnosis not present

## 2023-04-10 DIAGNOSIS — L821 Other seborrheic keratosis: Secondary | ICD-10-CM | POA: Diagnosis not present

## 2023-04-10 DIAGNOSIS — D485 Neoplasm of uncertain behavior of skin: Secondary | ICD-10-CM | POA: Diagnosis not present

## 2023-04-10 DIAGNOSIS — D0462 Carcinoma in situ of skin of left upper limb, including shoulder: Secondary | ICD-10-CM | POA: Diagnosis not present

## 2023-04-10 DIAGNOSIS — D2272 Melanocytic nevi of left lower limb, including hip: Secondary | ICD-10-CM | POA: Diagnosis not present

## 2023-04-10 DIAGNOSIS — Z85828 Personal history of other malignant neoplasm of skin: Secondary | ICD-10-CM | POA: Diagnosis not present

## 2023-04-10 DIAGNOSIS — L570 Actinic keratosis: Secondary | ICD-10-CM | POA: Diagnosis not present

## 2023-04-10 DIAGNOSIS — D224 Melanocytic nevi of scalp and neck: Secondary | ICD-10-CM | POA: Diagnosis not present

## 2023-04-24 DIAGNOSIS — D0462 Carcinoma in situ of skin of left upper limb, including shoulder: Secondary | ICD-10-CM | POA: Diagnosis not present

## 2023-04-29 ENCOUNTER — Other Ambulatory Visit (HOSPITAL_COMMUNITY): Payer: Self-pay

## 2023-05-02 ENCOUNTER — Other Ambulatory Visit (HOSPITAL_COMMUNITY): Payer: Self-pay

## 2023-05-02 ENCOUNTER — Encounter: Payer: Self-pay | Admitting: Family Medicine

## 2023-05-02 MED ORDER — MELOXICAM 15 MG PO TABS
15.0000 mg | ORAL_TABLET | Freq: Every day | ORAL | 0 refills | Status: DC
  Filled 2023-05-02: qty 90, 90d supply, fill #0

## 2023-05-16 ENCOUNTER — Ambulatory Visit: Payer: Commercial Managed Care - PPO | Admitting: Family Medicine

## 2023-05-21 ENCOUNTER — Other Ambulatory Visit (HOSPITAL_COMMUNITY): Payer: Self-pay

## 2023-05-21 ENCOUNTER — Encounter: Payer: Self-pay | Admitting: Family Medicine

## 2023-05-21 ENCOUNTER — Ambulatory Visit: Payer: Commercial Managed Care - PPO | Admitting: Family Medicine

## 2023-05-21 VITALS — BP 123/70 | HR 54 | Ht 68.0 in | Wt 248.0 lb

## 2023-05-21 DIAGNOSIS — E66812 Obesity, class 2: Secondary | ICD-10-CM

## 2023-05-21 DIAGNOSIS — R7303 Prediabetes: Secondary | ICD-10-CM | POA: Diagnosis not present

## 2023-05-21 DIAGNOSIS — Z6837 Body mass index (BMI) 37.0-37.9, adult: Secondary | ICD-10-CM

## 2023-05-21 DIAGNOSIS — Z125 Encounter for screening for malignant neoplasm of prostate: Secondary | ICD-10-CM

## 2023-05-21 DIAGNOSIS — I10 Essential (primary) hypertension: Secondary | ICD-10-CM

## 2023-05-21 DIAGNOSIS — K219 Gastro-esophageal reflux disease without esophagitis: Secondary | ICD-10-CM

## 2023-05-21 DIAGNOSIS — Z0000 Encounter for general adult medical examination without abnormal findings: Secondary | ICD-10-CM

## 2023-05-21 DIAGNOSIS — C9110 Chronic lymphocytic leukemia of B-cell type not having achieved remission: Secondary | ICD-10-CM | POA: Diagnosis not present

## 2023-05-21 DIAGNOSIS — E785 Hyperlipidemia, unspecified: Secondary | ICD-10-CM | POA: Diagnosis not present

## 2023-05-21 LAB — COMPREHENSIVE METABOLIC PANEL
ALT: 16 U/L (ref 0–53)
AST: 19 U/L (ref 0–37)
Albumin: 4.4 g/dL (ref 3.5–5.2)
Alkaline Phosphatase: 83 U/L (ref 39–117)
BUN: 24 mg/dL — ABNORMAL HIGH (ref 6–23)
CO2: 30 meq/L (ref 19–32)
Calcium: 9.6 mg/dL (ref 8.4–10.5)
Chloride: 98 meq/L (ref 96–112)
Creatinine, Ser: 1.26 mg/dL (ref 0.40–1.50)
GFR: 58.58 mL/min — ABNORMAL LOW (ref >60.00)
Glucose, Bld: 108 mg/dL — ABNORMAL HIGH (ref 70–99)
Potassium: 4.4 meq/L (ref 3.5–5.1)
Sodium: 136 meq/L (ref 135–145)
Total Bilirubin: 0.6 mg/dL (ref 0.2–1.2)
Total Protein: 6.8 g/dL (ref 6.0–8.3)

## 2023-05-21 LAB — LIPID PANEL
Cholesterol: 128 mg/dL (ref 0–200)
HDL: 30.7 mg/dL — ABNORMAL LOW (ref >39.00)
LDL Cholesterol: 61 mg/dL (ref 0–99)
NonHDL: 97.57
Total CHOL/HDL Ratio: 4
Triglycerides: 181 mg/dL — ABNORMAL HIGH (ref 0.0–149.0)
VLDL: 36.2 mg/dL (ref 0.0–40.0)

## 2023-05-21 LAB — HEMOGLOBIN A1C: Hgb A1c MFr Bld: 6.4 % (ref 4.6–6.5)

## 2023-05-21 LAB — TSH: TSH: 2.34 u[IU]/mL (ref 0.35–5.50)

## 2023-05-21 MED ORDER — LISINOPRIL-HYDROCHLOROTHIAZIDE 20-25 MG PO TABS
1.0000 | ORAL_TABLET | Freq: Every day | ORAL | 1 refills | Status: DC
  Filled 2023-05-21: qty 90, 90d supply, fill #0
  Filled 2023-08-13: qty 90, 90d supply, fill #1

## 2023-05-21 NOTE — Assessment & Plan Note (Signed)
Remission. No acute symptoms. Following annually with Dr. Myna Hidalgo

## 2023-05-21 NOTE — Assessment & Plan Note (Signed)
Blood pressure is at goal for age and co-morbidities.   Recommendations: continue lisinopril-hctz 20-25 mg daily - BP goal <130/80 - monitor and log blood pressures at home - check around the same time each day in a relaxed setting - Limit salt to <2000 mg/day - Follow DASH eating plan (heart healthy diet) - limit alcohol to 2 standard drinks per day for men and 1 per day for women - avoid tobacco products - get at least 2 hours of regular aerobic exercise weekly Patient aware of signs/symptoms requiring further/urgent evaluation. Labs today

## 2023-05-21 NOTE — Assessment & Plan Note (Signed)
Medication management: Continue Crestor 20 mg daily Lifestyle factors for lowering cholesterol include: Diet therapy - heart-healthy diet rich in fruits, veggies, fiber-rich whole grains, lean meats, chicken, fish (at least twice a week), fat-free or 1% dairy products; foods low in saturated/trans fats, cholesterol, sodium, and sugar. Mediterranean diet has shown to be very heart healthy. Regular exercise - recommend at least 30 minutes a day, 5 times per week Weight management

## 2023-05-21 NOTE — Assessment & Plan Note (Signed)
Stable on Nexium Lifestyle measures for reflux: - Avoid meals or carbonated beverages within 3 hours of bedtime - Minimize intake of fried, fatty, and spicy foods (this will help decrease gastric acid production) - Raise the head of the bed using 4-6 inch blocks (especially if symptoms are present at night) - Maintain a healthy weight and avoid tight fitting clothes, especially around the waist  - Avoid foods that relax the sphincter or worsen symptoms (chocolate, peppermint, fatty foods, citrus, spicy foods, tomatoes, coffee, caffeine)  - Minimize use of NSAIDs (ibuprofen, Aleve, etc), nicotine, and alcohol

## 2023-05-21 NOTE — Assessment & Plan Note (Signed)
Lab Results  Component Value Date   HGBA1C 6.1 (H) 08/05/2017   Encouraged heart healthy, low-carb diet, regular exercise, weight management.

## 2023-05-21 NOTE — Progress Notes (Signed)
Established Patient Office Visit  Subjective    Patient ID: Charles [name], male    DOB: [birth year]  Age: 67 y.o. MRN: [id number]  CC:  Chief Complaint  Patient presents with   Medical Management of Chronic Issues    HPI Charles [name] presents for chronic disease management.    Hypertension: - Medications: lisinopril-hctz 20-25 mg daily - Compliance: good - Checking BP at home: good usually - Denies any SOB, recurrent headaches, CP, vision changes, LE edema, dizziness, palpitations, or medication side effects. - Diet: general - Exercise: walking 6-10k steps per day   Hyperlipidemia: - medications: Crestor 20 mg daily  - compliance: good - medication SEs: none The 10-year ASCVD risk score (Arnett DK, et al., 2019) is: 18.1%   Values used to calculate the score:     Age: 6 years     Sex: Male     Is Non-Hispanic African American: No     Diabetic: No     Tobacco smoker: No     Systolic Blood Pressure: 123 mmHg     Is BP treated: Yes     HDL Cholesterol: 30 mg/dL     Total Cholesterol: 143 mg/dL  Cancer: - CLL; follows with [facility] Dr. Myna Hidalgo (annually) - Dx 2013, Chemo 2014, now in remission  Seasonal allergies: - usually Spring and Fall - Flonase, Zyrtec - well controlled   Prediabetes: - Tries to eat low-carb diet - Denies symptoms of hypoglycemia, polyuria, polydipsia, numbness extremities, foot ulcers/trauma, wounds that are not healing, medication side effects  Lab Results  Component Value Date   HGBA1C 6.1 (H) 08/05/2017   GERD: - management: Nexium 40 mg daily  - symptoms: no, very well controlled   Sees Dr. Lovenia Kim for dermatology. History of skin cancer (basal cell and melanoma).        ROS All review of systems negative except what is listed in the HPI      Objective    BP 123/70   Pulse (!) 54   Ht 5\' 8"  (1.727 m)   Wt 248 lb (112.5 kg)   SpO2 97%   BMI 37.71 kg/m   Physical Exam Vitals reviewed.   Constitutional:      General: He is not in acute distress.    Appearance: Normal appearance. He is obese. He is not ill-appearing.  Cardiovascular:     Rate and Rhythm: Normal rate and regular rhythm.     Pulses: Normal pulses.     Heart sounds: Normal heart sounds.  Pulmonary:     Effort: Pulmonary effort is normal.     Breath sounds: Normal breath sounds.  Skin:    General: Skin is warm and dry.  Neurological:     Mental Status: He is alert and oriented to person, place, and time.  Psychiatric:        Mood and Affect: Mood normal.        Behavior: Behavior normal.        Thought Content: Thought content normal.        Judgment: Judgment normal.         Assessment & Plan:   Problem List Items Addressed This Visit       Active Problems   CLL (chronic lymphocytic leukemia) (HCC) (Chronic)    Remission. No acute symptoms. Following annually with Dr. Myna Hidalgo      Hypertension (Chronic)    Blood pressure is at goal for age and co-morbidities.   Recommendations: continue  lisinopril-hctz 20-25 mg daily - BP goal <130/80 - monitor and log blood pressures at home - check around the same time each day in a relaxed setting - Limit salt to <2000 mg/day - Follow DASH eating plan (heart healthy diet) - limit alcohol to 2 standard drinks per day for men and 1 per day for women - avoid tobacco products - get at least 2 hours of regular aerobic exercise weekly Patient aware of signs/symptoms requiring further/urgent evaluation. Labs today        Relevant Medications   lisinopril-hydrochlorothiazide (ZESTORETIC) 20-25 MG tablet   Other Relevant Orders   Comprehensive metabolic panel   TSH   GERD (gastroesophageal reflux disease)    Stable on Nexium Lifestyle measures for reflux: - Avoid meals or carbonated beverages within 3 hours of bedtime - Minimize intake of fried, fatty, and spicy foods (this will help decrease gastric acid production) - Raise the head of the bed  using 4-6 inch blocks (especially if symptoms are present at night) - Maintain a healthy weight and avoid tight fitting clothes, especially around the waist  - Avoid foods that relax the sphincter or worsen symptoms (chocolate, peppermint, fatty foods, citrus, spicy foods, tomatoes, coffee, caffeine)  - Minimize use of NSAIDs (ibuprofen, Aleve, etc), nicotine, and alcohol       Pre-diabetes    Lab Results  Component Value Date   HGBA1C 6.1 (H) 08/05/2017   Encouraged heart healthy, low-carb diet, regular exercise, weight management.      Relevant Orders   Comprehensive metabolic panel   Hemoglobin A1c   Obesity    Encouraged healthy diet and regular exercise       Hyperlipidemia - Primary    Medication management: Continue Crestor 20 mg daily Lifestyle factors for lowering cholesterol include: Diet therapy - heart-healthy diet rich in fruits, veggies, fiber-rich whole grains, lean meats, chicken, fish (at least twice a week), fat-free or 1% dairy products; foods low in saturated/trans fats, cholesterol, sodium, and sugar. Mediterranean diet has shown to be very heart healthy. Regular exercise - recommend at least 30 minutes a day, 5 times per week Weight management         Relevant Medications   lisinopril-hydrochlorothiazide (ZESTORETIC) 20-25 MG tablet   Other Relevant Orders   Lipid panel    Return in about 6 months (around 11/19/2023) for physical.   Clayborne Dana, NP

## 2023-05-21 NOTE — Assessment & Plan Note (Signed)
Encouraged healthy diet and regular exercise   

## 2023-05-25 ENCOUNTER — Other Ambulatory Visit: Payer: Self-pay

## 2023-05-25 ENCOUNTER — Ambulatory Visit
Admission: EM | Admit: 2023-05-25 | Discharge: 2023-05-25 | Disposition: A | Discharge status: Home / Self Care | Payer: Commercial Managed Care - PPO | Attending: Internal Medicine | Admitting: Internal Medicine

## 2023-05-25 ENCOUNTER — Encounter: Payer: Self-pay | Admitting: *Deleted

## 2023-05-25 DIAGNOSIS — K120 Recurrent oral aphthae: Secondary | ICD-10-CM

## 2023-05-25 MED ORDER — VALACYCLOVIR HCL 1 G PO TABS
1000.0000 mg | ORAL_TABLET | Freq: Two times a day (BID) | ORAL | 0 refills | Status: AC
Start: 2023-05-25 — End: 2023-06-01

## 2023-05-25 MED ORDER — TRIAMCINOLONE ACETONIDE 0.1 % MT PSTE
1.0000 | PASTE | Freq: Two times a day (BID) | OROMUCOSAL | 0 refills | Status: DC
Start: 2023-05-25 — End: 2023-08-13

## 2023-05-25 NOTE — ED Triage Notes (Signed)
Reports recurrent "fever blisters" x 1 month.

## 2023-05-25 NOTE — Discharge Instructions (Signed)
I have prescribed a medication that you apply directly to area as well as pills that you will take.  Please follow-up if any symptoms persist or worsen.

## 2023-05-25 NOTE — ED Provider Notes (Signed)
[facility]    CSN: [id number] Arrival date & time: 05/25/23  1019      History   Chief Complaint Chief Complaint  Patient presents with   Mouth Lesions    HPI Charles [name] is a 67 y.o. male.   Patient presents today given concern of recurrent fever blisters to his mouth.  Reports that they have been present to various areas of his lips intermittently for the past month.  He now has 1 on the left side of his tongue that started this morning.  Reports that he had previous issues with fever blisters multiple years ago.  This is the first time he has been seen since symptoms started.   Mouth Lesions   Past Medical History:  Diagnosis Date   Acute medial meniscus tear of left knee 12/24/2019   Allergy    seasonal   Arthritis    CLL (chronic lymphoblastic leukemia) 08/03/2011   CLL (chronic lymphocytic leukemia) (HCC) 03/18/2012   GERD (gastroesophageal reflux disease)    Hyperlipidemia    Hypertension    Shingles    Ulcer     Patient Active Problem List   Diagnosis Date Noted   Hyperlipidemia 11/14/2022   Seasonal allergies 11/14/2022   Bilateral carpal tunnel syndrome 06/29/2022   Paresthesia 06/29/2022   Spondylolisthesis, lumbar region 06/05/2022   Spinal stenosis of lumbar region 05/14/2022   Erectile dysfunction 11/26/2020   Right-sided Bell's palsy 11/26/2020   Tubular adenoma of colon 08/05/2020   Elevated LFTs 05/23/2020   Skin cancer 08/31/2019   Hearing loss 02/19/2019   History of shingles 02/19/2019   Family history of prostate cancer 08/28/2018   Poor dentition 08/28/2018   Hypertension 08/02/2016   Pre-diabetes 07/14/2015   Obesity 07/14/2015   GERD (gastroesophageal reflux disease) 12/25/2012   CLL (chronic lymphocytic leukemia) (HCC) 03/18/2012    Past Surgical History:  Procedure Laterality Date   HAND SURGERY  1985   HERNIA REPAIR  1990   KNEE ARTHROSCOPY WITH LATERAL MENISECTOMY Left 12/24/2019   Procedure: KNEE  ARTHROSCOPY WITH PARTIAL LATERAL MENISECTOMY;  Surgeon: Kathryne Hitch, MD;  Location: [facility];  Service: Orthopedics;  Laterality: Left;   KNEE SURGERY Right 2005   SHOULDER SURGERY Left 2007   TRANSFORAMINAL LUMBAR INTERBODY FUSION W/ MIS 2 LEVEL Right 06/05/2022   Procedure: MINIMALLY INVASIVE DECOMPRESSION ,TRANSFORAMINAL LUMBAR INTERBODY FUSION, RIGHT SIDED APPROACH LUMBAR THRE-FOUR, LUMBAR FOUR-FIVE;  Surgeon: Dawley, Alan Mulder, DO;  Location: [facility];  Service: Neurosurgery;  Laterality: Right;   WRIST SURGERY Right        Home Medications    Prior to Admission medications   Medication Sig Start Date End Date Taking? Authorizing Provider  acetaminophen (TYLENOL) 500 MG tablet Take 1,000-1,500 mg by mouth daily.   Yes [provider]  Alpha-Lipoic Acid 600 MG CAPS Take 600 mg by mouth 2 (two) times daily.   Yes [provider]  aspirin EC (ASPIRIN LOW DOSE) 81 MG tablet Take 1 tablet (81 mg total) by mouth daily. 06/12/22  Yes Dawley, Troy C, DO  cetirizine (ZYRTEC) 10 MG tablet Take 10 mg by mouth daily.   Yes [provider]  Cholecalciferol (VITAMIN D3) 2000 UNITS TABS Take 2,000 Units by mouth.   Yes [provider]  esomeprazole (NEXIUM) 40 MG capsule Take 1 capsule (40 mg total) by mouth daily. 09/25/22  Yes   fluticasone (FLONASE) 50 MCG/ACT nasal spray Place 1 spray into both nostrils daily. 12/24/22  Yes   ketoconazole (NIZORAL) 2 % cream Apply topically 2 (two) times daily. 10/29/22  Yes   lisinopril-hydrochlorothiazide (ZESTORETIC) 20-25 MG tablet Take 1 tablet by mouth daily. 05/21/23  Yes Clayborne Dana, NP  meloxicam (MOBIC) 15 MG tablet Take 1 tablet (15 mg total) by mouth daily. 05/02/23  Yes Clayborne Dana, NP  methocarbamol (ROBAXIN) 750 MG tablet Take 1 tablet (750 mg total) by mouth 3 (three) times daily as needed for muscle spasms 11/05/22  Yes   Oxycodone HCl 10 MG TABS Take 1 tablet (10 mg total) by mouth every  4-6 hours. 06/15/22  Yes   rosuvastatin (CRESTOR) 20 MG tablet Take one tablet (20 mg dose) by mouth at bedtime. 10/13/22  Yes   triamcinolone (KENALOG) 0.1 % paste Use as directed 1 Application in the mouth or throat 2 (two) times daily. 05/25/23  Yes Purvis Sidle, Acie Fredrickson, FNP  valACYclovir (VALTREX) 1000 MG tablet Take 1 tablet (1,000 mg total) by mouth 2 (two) times daily for 7 days. 05/25/23 06/01/23 Yes Rocko Fesperman, Acie Fredrickson, FNP  chlorhexidine (PERIDEX) 0.12 % solution Rinse with 15 mLs for 30 seconds 2 (two) times daily. 01/03/23       Family History Family History  Problem Relation Age of Onset   Diabetes Mother    Hyperlipidemia Father    Hearing loss Father    Cancer Sister    Breast cancer Sister    Melanoma Sister    Cancer Brother 66       prostate cancer stage 4   Cancer Brother 61       prostate   Cancer Brother    Cancer - Colon Brother    Colon cancer Brother    Neuropathy Neg Hx     Social History Social History   Tobacco Use   Smoking status: Former    Current packs/day: 0.00    Average packs/day: 2.0 packs/day for 20.0 years (40.0 ttl pk-yrs)    Types: Cigarettes    Start date: 08/24/1978    Quit date: 08/24/1998    Years since quitting: 24.7   Smokeless tobacco: Never   Tobacco comments:    quit smoking 15 years ago  Vaping Use   Vaping status: Never Used  Substance Use Topics   Alcohol use: Yes    Comment: 1-2 beers a month   Drug use: No     Allergies   Patient has no known allergies.   Review of Systems Review of Systems Per HPI  Physical Exam Triage Vital Signs ED Triage Vitals  Encounter Vitals Group     BP 05/25/23 1138 (!) 146/77     Systolic BP Percentile --      Diastolic BP Percentile --      Pulse Rate 05/25/23 1138 (!) 54     Resp 05/25/23 1138 18     Temp 05/25/23 1138 97.8 F (36.6 C)     Temp Source 05/25/23 1138 Oral     SpO2 05/25/23 1138 99 %     Weight --      Height --      Head Circumference --      Peak Flow --      Pain  Score 05/25/23 1134 5     Pain Loc --      Pain Education --      Exclude from Growth Chart --    No data found.  Updated Vital Signs BP (!) 146/77 (BP Location: Right Arm)  Pulse (!) 54   Temp 97.8 F (36.6 C) (Oral)   Resp 18   SpO2 99%   Visual Acuity Right Eye Distance:   Left Eye Distance:   Bilateral Distance:    Right Eye Near:   Left Eye Near:    Bilateral Near:     Physical Exam Constitutional:      General: He is not in acute distress.    Appearance: Normal appearance. He is not toxic-appearing or diaphoretic.  HENT:     Head: Normocephalic and atraumatic.     Mouth/Throat:     Comments: No lesions to lips. Patient has vesicular type lesion to left lateral tongue.  Eyes:     Extraocular Movements: Extraocular movements intact.     Conjunctiva/sclera: Conjunctivae normal.  Pulmonary:     Effort: Pulmonary effort is normal.  Neurological:     General: No focal deficit present.     Mental Status: He is alert and oriented to person, place, and time. Mental status is at baseline.  Psychiatric:        Mood and Affect: Mood normal.        Behavior: Behavior normal.        Thought Content: Thought content normal.        Judgment: Judgment normal.      UC Treatments / Results  Labs (all labs ordered are listed, but only abnormal results are displayed) Labs Reviewed - No data to display  EKG   Radiology No results found.  Procedures Procedures (including critical care time)  Medications Ordered in UC Medications - No data to display  Initial Impression / Assessment and Plan / UC Course  I have reviewed the triage vital signs and the nursing notes.  Pertinent labs & imaging results that were available during my care of the patient were reviewed by me and considered in my medical decision making (see chart for details).     Lesion to tongue appears most consistent with aphthous ulcer.  Although, patient has been reporting intermittent similar  lesions throughout the lips as well and has history of fever blisters.  Therefore, will opt to treat with Orabase topically and Valtrex for 7 days to see if this will be helpful.  Creatinine clearance is 89 so no dosage adjustment necessary.  Advised to follow-up with PCP if symptoms persist or worsen.  Patient verbalized understanding and was agreeable with plan. Final Clinical Impressions(s) / UC Diagnoses   Final diagnoses:  Aphthous ulcer     Discharge Instructions      I have prescribed a medication that you apply directly to area as well as pills that you will take.  Please follow-up if any symptoms persist or worsen.    ED Prescriptions     Medication Sig Dispense Auth. Provider   triamcinolone (KENALOG) 0.1 % paste Use as directed 1 Application in the mouth or throat 2 (two) times daily. 5 g Ervin Knack E, [place]   valACYclovir (VALTREX) 1000 MG tablet Take 1 tablet (1,000 mg total) by mouth 2 (two) times daily for 7 days. 14 tablet Appleby, Acie Fredrickson, [place]      PDMP not reviewed this encounter.   Gustavus Bryant, [place] 05/25/23 1230

## 2023-05-28 ENCOUNTER — Encounter: Payer: Self-pay | Admitting: Family Medicine

## 2023-05-31 ENCOUNTER — Other Ambulatory Visit (HOSPITAL_COMMUNITY): Payer: Self-pay

## 2023-07-30 ENCOUNTER — Other Ambulatory Visit: Payer: Self-pay | Admitting: Family Medicine

## 2023-07-30 ENCOUNTER — Other Ambulatory Visit (HOSPITAL_COMMUNITY): Payer: Self-pay

## 2023-07-30 MED ORDER — MELOXICAM 15 MG PO TABS
15.0000 mg | ORAL_TABLET | Freq: Every day | ORAL | 0 refills | Status: DC
  Filled 2023-07-30: qty 90, 90d supply, fill #0

## 2023-08-13 ENCOUNTER — Ambulatory Visit
Admission: EM | Admit: 2023-08-13 | Discharge: 2023-08-13 | Disposition: A | Discharge status: Home / Self Care | Payer: Commercial Managed Care - PPO | Attending: Family Medicine | Admitting: Family Medicine

## 2023-08-13 ENCOUNTER — Other Ambulatory Visit (HOSPITAL_COMMUNITY): Payer: Self-pay

## 2023-08-13 DIAGNOSIS — R051 Acute cough: Secondary | ICD-10-CM | POA: Diagnosis not present

## 2023-08-13 MED ORDER — HYDROCODONE BIT-HOMATROP MBR 5-1.5 MG/5ML PO SOLN
5.0000 mL | Freq: Four times a day (QID) | ORAL | 0 refills | Status: DC | PRN
Start: 2023-08-13 — End: 2023-10-10
  Filled 2023-08-13: qty 90, 5d supply, fill #0

## 2023-08-13 NOTE — ED Provider Notes (Signed)
[facility]   [id number] 08/13/23 Arrival Time: 1001  ASSESSMENT & PLAN:  1. Acute cough    Likely post-viral cough. As needed: Discharge Medication List as of 08/13/2023 12:50 PM     START taking these medications   Details  HYDROcodone bit-homatropine (HYCODAN) 5-1.5 MG/5ML syrup Take 5 mLs by mouth every 6 (six) hours as needed for cough., Starting Tue 08/13/2023, Normal       Lungs CTAB.   Follow-up Information     Clayborne Dana, NP.   Specialties: Family Medicine, Emergency Medicine Why: As needed. Contact information: [address] [place] [address] [phone]                 Reviewed expectations re: course of current medical issues. Questions answered. Outlined signs and symptoms indicating need for more acute intervention. Understanding verbalized. After Visit Summary given.   SUBJECTIVE: History from: Patient. Charles [name] is a 68 y.o. male. Reports: dry, hacking cough; x 1 week after URI symptoms. Denies: fever and difficulty breathing. Normal PO intake without n/v/d.  OBJECTIVE:  Vitals:   08/13/23 1223  BP: 132/82  Pulse: 70  Resp: 18  Temp: 97.7 F (36.5 C)  TempSrc: Oral  SpO2: 98%    General appearance: alert; no distress Eyes: PERRLA; EOMI; conjunctiva normal HENT: [place]; AT; without nasal congestion Neck: supple  Lungs: speaks full sentences without difficulty; unlabored; CTAB; dry cough Extremities: no edema Skin: warm and dry Neurologic: normal gait Psychological: alert and cooperative; normal mood and affect   No Known Allergies  Past Medical History:  Diagnosis Date   Acute medial meniscus tear of left knee 12/24/2019   Allergy    seasonal   Arthritis    CLL (chronic lymphoblastic leukemia) 08/03/2011   CLL (chronic lymphocytic leukemia) (HCC) 03/18/2012   GERD (gastroesophageal reflux disease)    Hyperlipidemia    Hypertension    Shingles    Ulcer    Social History    Socioeconomic History   Marital status: Married    Spouse name: Ginger   Number of children: 2   Years of education: Not on file   Highest education level: Some college, no degree  Occupational History   Occupation: [employer]  Tobacco Use   Smoking status: Former    Current packs/day: 0.00    Average packs/day: 2.0 packs/day for 20.0 years (40.0 ttl pk-yrs)    Types: Cigarettes    Start date: 08/24/1978    Quit date: 08/24/1998    Years since quitting: 24.9   Smokeless tobacco: Never   Tobacco comments:    quit smoking 15 years ago  Vaping Use   Vaping status: Never Used  Substance and Sexual Activity   Alcohol use: Yes    Comment: 1-2 beers a month   Drug use: No   Sexual activity: Not Currently    Partners: Female    Birth control/protection: None  Other Topics Concern   Not on file  Social History Narrative   Married. Education: [employer]. Exercise: Walk    Education: [employer]   Works for [employer] in [employer]   Social Drivers of [employer]: Low Risk  (05/14/2023)   Overall Financial Resource Strain (CARDIA)    Difficulty of Paying Living Expenses: Not hard at all  Food Insecurity: No Food Insecurity (05/14/2023)   Hunger Vital Sign    Worried About Running Out of Food in the Last Year: Never true  Ran Out of Food in the Last Year: Never true  Transportation Needs: No Transportation Needs (05/14/2023)   PRAPARE - Administrator, Civil Service (Medical): No    Lack of Transportation (Non-Medical): No  Physical Activity: Inactive (05/14/2023)   Exercise Vital Sign    Days of Exercise per Week: 2 days    Minutes of Exercise per Session: 0 min  Stress: No Stress Concern Present (05/14/2023)   [employer] of Occupational Health - Occupational Stress Questionnaire    Feeling of Stress : Only a little  Social Connections: Unknown (05/14/2023)   Social Connection and Isolation Panel [NHANES]    Frequency of  Communication with Friends and Family: Never    Frequency of Social Gatherings with Friends and Family: Never    Attends Religious Services: Patient declined    Database administrator or Organizations: No    Attends Engineer, structural: Not on file    Marital Status: Married  Catering manager Violence: Not At Risk (10/10/2022)   Received from [employer], [facility]   HITS    Over the last 12 months how often did your partner physically hurt you?: Never    Over the last 12 months how often did your partner insult you or talk down to you?: Never    Over the last 12 months how often did your partner threaten you with physical harm?: Never    Over the last 12 months how often did your partner scream or curse at you?: Never   Family History  Problem Relation Age of Onset   Diabetes Mother    Hyperlipidemia Father    Hearing loss Father    Cancer Sister    Breast cancer Sister    Melanoma Sister    Cancer Brother 62       prostate cancer stage 4   Cancer Brother 73       prostate   Cancer Brother    Cancer - Colon Brother    Colon cancer Brother    Neuropathy Neg Hx    Past Surgical History:  Procedure Laterality Date   HAND SURGERY  1985   HERNIA REPAIR  1990   KNEE ARTHROSCOPY WITH LATERAL MENISECTOMY Left 12/24/2019   Procedure: KNEE ARTHROSCOPY WITH PARTIAL LATERAL MENISECTOMY;  Surgeon: Kathryne Hitch, MD;  Location: [facility];  Service: Orthopedics;  Laterality: Left;   KNEE SURGERY Right 2005   SHOULDER SURGERY Left 2007   TRANSFORAMINAL LUMBAR INTERBODY FUSION W/ MIS 2 LEVEL Right 06/05/2022   Procedure: MINIMALLY INVASIVE DECOMPRESSION ,TRANSFORAMINAL LUMBAR INTERBODY FUSION, RIGHT SIDED APPROACH LUMBAR THRE-FOUR, LUMBAR FOUR-FIVE;  Surgeon: Dawley, Alan Mulder, DO;  Location: [facility];  Service: Neurosurgery;  Laterality: Right;   WRIST SURGERY Right      Mardella Layman, MD 08/13/23 1349

## 2023-08-13 NOTE — ED Triage Notes (Signed)
Patient reports productive cough x 1 week. Patient has been using Flonase with no relief.

## 2023-08-13 NOTE — ED Triage Notes (Signed)
Denies any fever, nausea or vomiting.

## 2023-08-14 ENCOUNTER — Encounter: Payer: Self-pay | Admitting: Orthopaedic Surgery

## 2023-08-14 ENCOUNTER — Ambulatory Visit: Payer: Commercial Managed Care - PPO | Admitting: Orthopaedic Surgery

## 2023-08-14 DIAGNOSIS — G5602 Carpal tunnel syndrome, left upper limb: Secondary | ICD-10-CM | POA: Diagnosis not present

## 2023-08-14 DIAGNOSIS — G5601 Carpal tunnel syndrome, right upper limb: Secondary | ICD-10-CM

## 2023-08-14 NOTE — Progress Notes (Signed)
Office Visit Note   Patient: Charles [name]           Date of Birth: 11-03-1955           MRN: [id number] Visit Date: 08/14/2023              Requested by: Clayborne Dana, NP [address] [place],  [place] [address] PCP: Clayborne Dana, NP   Assessment & Plan: Visit Diagnoses:  1. Carpal tunnel syndrome, left upper limb   2. Carpal tunnel syndrome, right upper limb     Plan: Given the severity of his carpal tunnel syndrome on EMG nerve conduction studies did discuss with him that he may not gain full sensation back in the left hand after surgery.  Risk benefits of surgery discussed.  He did like to proceed with left carpal tunnel release mid March or early April of this year.  Postoperative protocol discussed.  Questions were encouraged and answered at length.  Follow-Up Instructions: No follow-ups on file.   Orders:  No orders of the defined types were placed in this encounter.  No orders of the defined types were placed in this encounter.     Procedures: No procedures performed   Clinical Data: No additional findings.   Subjective: Chief Complaint  Patient presents with   Left Hand - Pain    HPI Charles [name] comes in today due to left hand numbness.  He states that anytime he tries to pick anything up he drops it.  He is left-hand dominant.  He has had prior EMG nerve conduction studies of both upper extremities performed at [facility].  Studies were done on 06/29/2022.  At that point time he was found to have severe bilateral median neuropathy.  Right side with delaminating features and on the left there was axonal loss.  He states he has no numbness tingling in the right hand however.  He is wanting to have carpal tunnel surgery on the left as this is his dominant hand.  He is diabetic last hemoglobin A1c was 6.1 EMG nerve conduction study dated 06/29/2022.  Showed left median nerve sensory and motor responses were absent.  Right  median nerve sensory responses showed mildly prolonged peak latency latency with mildly decreased snap amplitude.  Right median motor response showed mildly prolonged distal latency, with mildly decreased CMAP amplitude.  Review of Systems Negative for fevers chills  Objective: Vital Signs: There were no vitals taken for this visit.  Physical Exam General: Well-developed well-nourished male no acute distress. Psych: Alert and oriented x 3 Vascular: Radial pulses 2+ bilaterally Ortho Exam Bilateral hands he has sensation intact light touch however subjective decrease sensation on the left throughout the median distribution compared to the right.  Negative Tinel's over the median nerve at the wrist bilaterally.  Negative Phalen's bilaterally.  Negative compression test over the median nerve at the wrist bilaterally. Specialty Comments:  No specialty comments available.  Imaging: No results found.   PMFS History: Patient Active Problem List   Diagnosis Date Noted   Hyperlipidemia 11/14/2022   Seasonal allergies 11/14/2022   Bilateral carpal tunnel syndrome 06/29/2022   Paresthesia 06/29/2022   Spondylolisthesis, lumbar region 06/05/2022   Spinal stenosis of lumbar region 05/14/2022   Erectile dysfunction 11/26/2020   Right-sided Bell's palsy 11/26/2020   Tubular adenoma of colon 08/05/2020   Elevated LFTs 05/23/2020   Skin cancer 08/31/2019   Hearing loss 02/19/2019   History of shingles  02/19/2019   Family history of prostate cancer 08/28/2018   Poor dentition 08/28/2018   Hypertension 08/02/2016   Pre-diabetes 07/14/2015   Obesity 07/14/2015   GERD (gastroesophageal reflux disease) 12/25/2012   CLL (chronic lymphocytic leukemia) (HCC) 03/18/2012   Past Medical History:  Diagnosis Date   Acute medial meniscus tear of left knee 12/24/2019   Allergy    seasonal   Arthritis    CLL (chronic lymphoblastic leukemia) 08/03/2011   CLL (chronic lymphocytic leukemia) (HCC)  03/18/2012   GERD (gastroesophageal reflux disease)    Hyperlipidemia    Hypertension    Shingles    Ulcer     Family History  Problem Relation Age of Onset   Diabetes Mother    Hyperlipidemia Father    Hearing loss Father    Cancer Sister    Breast cancer Sister    Melanoma Sister    Cancer Brother 14       prostate cancer stage 4   Cancer Brother 24       prostate   Cancer Brother    Cancer - Colon Brother    Colon cancer Brother    Neuropathy Neg Hx     Past Surgical History:  Procedure Laterality Date   HAND SURGERY  1985   HERNIA REPAIR  1990   KNEE ARTHROSCOPY WITH LATERAL MENISECTOMY Left 12/24/2019   Procedure: KNEE ARTHROSCOPY WITH PARTIAL LATERAL MENISECTOMY;  Surgeon: Kathryne Hitch, MD;  Location: [facility];  Service: Orthopedics;  Laterality: Left;   KNEE SURGERY Right 2005   SHOULDER SURGERY Left 2007   TRANSFORAMINAL LUMBAR INTERBODY FUSION W/ MIS 2 LEVEL Right 06/05/2022   Procedure: MINIMALLY INVASIVE DECOMPRESSION ,TRANSFORAMINAL LUMBAR INTERBODY FUSION, RIGHT SIDED APPROACH LUMBAR THRE-FOUR, LUMBAR FOUR-FIVE;  Surgeon: Dawley, Alan Mulder, DO;  Location: [facility];  Service: Neurosurgery;  Laterality: Right;   WRIST SURGERY Right    Social History   Occupational History   Occupation: [employer]  Tobacco Use   Smoking status: Former    Current packs/day: 0.00    Average packs/day: 2.0 packs/day for 20.0 years (40.0 ttl pk-yrs)    Types: Cigarettes    Start date: 08/24/1978    Quit date: 08/24/1998    Years since quitting: 24.9   Smokeless tobacco: Never   Tobacco comments:    quit smoking 15 years ago  Vaping Use   Vaping status: Never Used  Substance and Sexual Activity   Alcohol use: Yes    Comment: 1-2 beers a month   Drug use: No   Sexual activity: Not Currently    Partners: Female    Birth control/protection: None

## 2023-08-15 ENCOUNTER — Other Ambulatory Visit (HOSPITAL_COMMUNITY): Payer: Self-pay

## 2023-08-15 ENCOUNTER — Other Ambulatory Visit (HOSPITAL_COMMUNITY): Payer: Self-pay | Admitting: Family Medicine

## 2023-08-20 ENCOUNTER — Other Ambulatory Visit (HOSPITAL_COMMUNITY): Payer: Self-pay

## 2023-08-22 ENCOUNTER — Other Ambulatory Visit: Payer: Self-pay

## 2023-08-22 ENCOUNTER — Inpatient Hospital Stay: Payer: Commercial Managed Care - PPO | Admitting: Hematology & Oncology

## 2023-08-22 ENCOUNTER — Inpatient Hospital Stay: Payer: Commercial Managed Care - PPO | Attending: Hematology & Oncology

## 2023-08-22 ENCOUNTER — Encounter: Payer: Self-pay | Admitting: Hematology & Oncology

## 2023-08-22 VITALS — BP 119/62 | HR 51 | Temp 97.6°F | Resp 16 | Ht 68.0 in | Wt 248.0 lb

## 2023-08-22 DIAGNOSIS — C9111 Chronic lymphocytic leukemia of B-cell type in remission: Secondary | ICD-10-CM | POA: Diagnosis not present

## 2023-08-22 DIAGNOSIS — C9110 Chronic lymphocytic leukemia of B-cell type not having achieved remission: Secondary | ICD-10-CM | POA: Diagnosis not present

## 2023-08-22 LAB — CMP (CANCER CENTER ONLY)
ALT: 10 U/L (ref 0–44)
AST: 16 U/L (ref 15–41)
Albumin: 4.3 g/dL (ref 3.5–5.0)
Alkaline Phosphatase: 66 U/L (ref 38–126)
Anion gap: 8 (ref 5–15)
BUN: 17 mg/dL (ref 8–23)
CO2: 30 mmol/L (ref 22–32)
Calcium: 9.4 mg/dL (ref 8.9–10.3)
Chloride: 101 mmol/L (ref 98–111)
Creatinine: 1.26 mg/dL — ABNORMAL HIGH (ref 0.61–1.24)
GFR, Estimated: >60 mL/min (ref >60)
Glucose, Bld: 113 mg/dL — ABNORMAL HIGH (ref 70–99)
Potassium: 4.1 mmol/L (ref 3.5–5.1)
Sodium: 139 mmol/L (ref 135–145)
Total Bilirubin: 0.6 mg/dL (ref 0.0–1.2)
Total Protein: 6.9 g/dL (ref 6.5–8.1)

## 2023-08-22 LAB — CBC WITH DIFFERENTIAL (CANCER CENTER ONLY)
Abs Immature Granulocytes: 0.03 10*3/uL (ref 0.00–0.07)
Basophils Absolute: 0.1 10*3/uL (ref 0.0–0.1)
Basophils Relative: 1 %
Eosinophils Absolute: 0.3 10*3/uL (ref 0.0–0.5)
Eosinophils Relative: 2 %
HCT: 44.1 % (ref 39.0–52.0)
Hemoglobin: 15.1 g/dL (ref 13.0–17.0)
Immature Granulocytes: 0 %
Lymphocytes Relative: 48 %
Lymphs Abs: 5.5 10*3/uL — ABNORMAL HIGH (ref 0.7–4.0)
MCH: 32.4 pg (ref 26.0–34.0)
MCHC: 34.2 g/dL (ref 30.0–36.0)
MCV: 94.6 fL (ref 80.0–100.0)
Monocytes Absolute: 0.6 10*3/uL (ref 0.1–1.0)
Monocytes Relative: 5 %
Neutro Abs: 5 10*3/uL (ref 1.7–7.7)
Neutrophils Relative %: 44 %
Platelet Count: 192 10*3/uL (ref 150–400)
RBC: 4.66 MIL/uL (ref 4.22–5.81)
RDW: 12.8 % (ref 11.5–15.5)
WBC Count: 11.5 10*3/uL — ABNORMAL HIGH (ref 4.0–10.5)
nRBC: 0 % (ref 0.0–0.2)

## 2023-08-22 LAB — SAVE SMEAR(SSMR), FOR PROVIDER SLIDE REVIEW

## 2023-08-22 NOTE — Progress Notes (Signed)
Hematology and Oncology Follow Up Visit  [name] [id number] 1955/12/18 68 y.o. 08/22/2023   Principle Diagnosis:  Chronic lymphocytic leukemia (Trisomy 12) - stage C - remission  Current Therapy:   Observation   Interim History:  Charles [name] is here today for follow-up.  I see him once a year, although, for the most part, I do see him in between as he works over at [employer].  He is really doing well after he had his back surgery.  He had the back surgery over a year ago.  This really has helped him out.  Unfortunately, his wife is going need to have back surgery but she we will be able to because of her medications and her heart.  Otherwise, he has had no problems with COVID.  He has had no nausea or vomiting.  He has had no change in bowel or bladder habits.  He has had no rashes.  There has been no leg swelling.  He is still working full-time.  Overall, I would say that his performance status is probably ECOG 1.     Medications:  Allergies as of 08/22/2023   No Known Allergies      Medication List        Accurate as of August 22, 2023 11:59 AM. If you have any questions, ask your nurse or doctor.          acetaminophen 500 MG tablet Commonly known as: TYLENOL Take 1,000-1,500 mg by mouth daily.   Aspirin Low Dose 81 MG tablet Generic drug: aspirin EC Take 1 tablet (81 mg total) by mouth daily.   cetirizine 10 MG tablet Commonly known as: ZYRTEC Take 10 mg by mouth daily.   esomeprazole 40 MG capsule Commonly known as: NEXIUM Take 1 capsule (40 mg total) by mouth daily.   fluticasone 50 MCG/ACT nasal spray Commonly known as: FLONASE Place 1 spray into both nostrils daily.   HYDROcodone bit-homatropine 5-1.5 MG/5ML syrup Commonly known as: HYCODAN Take 5 mLs by mouth every 6 (six) hours as needed for cough.   lisinopril-hydrochlorothiazide 20-25 MG tablet Commonly known as: ZESTORETIC Take 1 tablet by mouth daily.   meloxicam 15 MG  tablet Commonly known as: MOBIC Take 1 tablet (15 mg total) by mouth daily.   methocarbamol 750 MG tablet Commonly known as: ROBAXIN Take 1 tablet (750 mg total) by mouth 3 (three) times daily as needed for muscle spasms   Oxycodone HCl 10 MG Tabs Take 1 tablet (10 mg total) by mouth every 4-6 hours.   rosuvastatin 20 MG tablet Commonly known as: CRESTOR Take one tablet (20 mg dose) by mouth at bedtime.   Vitamin D3 50 MCG (2000 UT) Tabs Take 2,000 Units by mouth.        Allergies: No Known Allergies  Past Medical History, Surgical history, Social history, and Family History were reviewed and updated.  Review of Systems: Review of Systems  Constitutional: Negative.   HENT: Negative.    Eyes: Negative.   Cardiovascular: Negative.   Gastrointestinal: Negative.   Genitourinary: Negative.   Musculoskeletal: Negative.   Skin: Negative.   Neurological: Negative.   Endo/Heme/Allergies: Negative.   Psychiatric/Behavioral: Negative.       Physical Exam:  height is 5\' 8"  (1.727 m) and weight is 248 lb (112.5 kg). His oral temperature is 97.6 F (36.4 C). His blood pressure is 119/62 and his pulse is 51 (abnormal). His respiration is 16 and oxygen saturation is 98%.   Wt  Readings from Last 3 Encounters:  08/22/23 248 lb (112.5 kg)  05/21/23 248 lb (112.5 kg)  11/14/22 253 lb (114.8 kg)    Physical Exam Vitals reviewed.  HENT:     Head: Normocephalic and atraumatic.  Eyes:     Pupils: Pupils are equal, round, and reactive to light.  Cardiovascular:     Rate and Rhythm: Normal rate and regular rhythm.     Heart sounds: Normal heart sounds.  Pulmonary:     Effort: Pulmonary effort is normal.     Breath sounds: Normal breath sounds.  Abdominal:     General: Bowel sounds are normal.     Palpations: Abdomen is soft.  Musculoskeletal:        General: No tenderness or deformity. Normal range of motion.     Cervical back: Normal range of motion.  Lymphadenopathy:      Cervical: No cervical adenopathy.  Skin:    General: Skin is warm and dry.     Findings: No erythema or rash.  Neurological:     Mental Status: He is alert and oriented to person, place, and time.  Psychiatric:        Behavior: Behavior normal.        Thought Content: Thought content normal.        Judgment: Judgment normal.      Lab Results  Component Value Date   WBC 11.5 (H) 08/22/2023   HGB 15.1 08/22/2023   HCT 44.1 08/22/2023   MCV 94.6 08/22/2023   PLT 192 08/22/2023   Lab Results  Component Value Date   FERRITIN 301.5 05/23/2020   Lab Results  Component Value Date   RETICCTPCT 1.8 01/31/2011   RBC 4.66 08/22/2023   RETICCTABS 79.6 01/31/2011   Lab Results  Component Value Date   KPAFRELGTCHN 21.8 (H) 02/10/2021   LAMBDASER 13.5 02/10/2021   KAPLAMBRATIO 1.61 02/10/2021   Lab Results  Component Value Date   IGGSERUM 735 08/21/2022   IGA 129 08/21/2022   IGMSERUM 70 08/21/2022   Lab Results  Component Value Date   TOTALPROTELP 6.8 02/10/2021   ALBUMINELP 4.2 02/10/2021   A1GS 0.2 02/10/2021   A2GS 0.8 02/10/2021   BETS 0.9 02/10/2021   BETA2SER 4.1 04/23/2013   GAMS 0.7 02/10/2021   MSPIKE Not Observed 02/10/2021   SPEI * 04/23/2013     Chemistry      Component Value Date/Time   NA 139 08/22/2023 1025   NA 140 08/05/2017 1115   NA 139 06/26/2016 1000   K 4.1 08/22/2023 1025   K 4.4 06/26/2016 1000   CL 101 08/22/2023 1025   CL 102 04/10/2012 0815   CO2 30 08/22/2023 1025   CO2 27 06/26/2016 1000   BUN 17 08/22/2023 1025   BUN 16 08/05/2017 1115   BUN 13.5 06/26/2016 1000   CREATININE 1.26 (H) 08/22/2023 1025   CREATININE 1.1 06/26/2016 1000      Component Value Date/Time   CALCIUM 9.4 08/22/2023 1025   CALCIUM 9.5 06/26/2016 1000   ALKPHOS 66 08/22/2023 1025   ALKPHOS 83 06/26/2016 1000   AST 16 08/22/2023 1025   AST 20 06/26/2016 1000   ALT 10 08/22/2023 1025   ALT 19 06/26/2016 1000   BILITOT 0.6 08/22/2023 1025   BILITOT  0.69 06/26/2016 1000      Impression and Plan: Charles [name] is a very pleasant 68 yo male with CLL.  He is doing quite nicely.  His blood counts are doing  okay.  The lymphocyte trend is slowly going upward.  However, this is been over a matter of 5 years.  We will still plan to get him back in 1 year.  I think this is reasonable.  caucasian male with CLL.  Marland Kitchen    Josph Macho, MD 1/30/202511:59 AM

## 2023-08-23 ENCOUNTER — Telehealth: Payer: Self-pay

## 2023-08-23 NOTE — Telephone Encounter (Signed)
I called patient to discuss scheduling CTR and left voice mail for return call.

## 2023-08-27 ENCOUNTER — Telehealth: Payer: Self-pay

## 2023-08-27 NOTE — Telephone Encounter (Signed)
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  How long should he anticipate being out of work after CTR?

## 2023-08-27 NOTE — Telephone Encounter (Signed)
I called and advised patient.

## 2023-08-30 ENCOUNTER — Other Ambulatory Visit (HOSPITAL_COMMUNITY): Payer: Self-pay

## 2023-08-30 ENCOUNTER — Telehealth: Payer: Self-pay | Admitting: Orthopaedic Surgery

## 2023-08-30 NOTE — Telephone Encounter (Signed)
 [employer] forms received. To [employer].

## 2023-09-03 ENCOUNTER — Other Ambulatory Visit: Payer: Self-pay

## 2023-09-03 ENCOUNTER — Other Ambulatory Visit: Payer: Self-pay | Admitting: Family Medicine

## 2023-09-03 ENCOUNTER — Other Ambulatory Visit (HOSPITAL_COMMUNITY): Payer: Self-pay

## 2023-09-03 MED ORDER — KETOCONAZOLE 2 % EX CREA
TOPICAL_CREAM | Freq: Two times a day (BID) | CUTANEOUS | 3 refills | Status: DC
  Filled 2023-09-03: qty 60, 30d supply, fill #0
  Filled 2023-09-25 – 2023-09-27 (×2): qty 60, 30d supply, fill #1
  Filled 2023-10-29: qty 60, 30d supply, fill #2
  Filled 2023-12-05: qty 60, 30d supply, fill #3

## 2023-09-04 ENCOUNTER — Other Ambulatory Visit (HOSPITAL_COMMUNITY): Payer: Self-pay

## 2023-09-09 ENCOUNTER — Telehealth: Payer: Self-pay | Admitting: Orthopaedic Surgery

## 2023-09-09 NOTE — Telephone Encounter (Signed)
Pt paid FMLA 25.00 cash on 09/09/2023 [employer]

## 2023-09-25 ENCOUNTER — Other Ambulatory Visit (HOSPITAL_COMMUNITY): Payer: Self-pay

## 2023-09-26 ENCOUNTER — Encounter (HOSPITAL_COMMUNITY): Payer: Self-pay

## 2023-09-26 ENCOUNTER — Other Ambulatory Visit (HOSPITAL_COMMUNITY): Payer: Self-pay

## 2023-09-26 ENCOUNTER — Other Ambulatory Visit: Payer: Self-pay | Admitting: Family Medicine

## 2023-09-27 ENCOUNTER — Other Ambulatory Visit (HOSPITAL_COMMUNITY): Payer: Self-pay

## 2023-09-27 MED ORDER — ESOMEPRAZOLE MAGNESIUM 40 MG PO CPDR
40.0000 mg | DELAYED_RELEASE_CAPSULE | Freq: Every day | ORAL | 0 refills | Status: DC
  Filled 2023-09-27: qty 90, 90d supply, fill #0

## 2023-10-10 ENCOUNTER — Other Ambulatory Visit: Payer: Self-pay

## 2023-10-10 ENCOUNTER — Encounter (HOSPITAL_BASED_OUTPATIENT_CLINIC_OR_DEPARTMENT_OTHER): Payer: Self-pay | Admitting: Orthopaedic Surgery

## 2023-10-10 NOTE — Progress Notes (Signed)
 Surgery scheduled with Dr. Magnus Ivan on 10/17/23 at [facility]. Spoke with Sherrie at Dr. Eliberto Ivory office requesting ASA orders.

## 2023-10-11 ENCOUNTER — Encounter (HOSPITAL_BASED_OUTPATIENT_CLINIC_OR_DEPARTMENT_OTHER)
Admission: RE | Admit: 2023-10-11 | Discharge: 2023-10-11 | Disposition: A | Discharge status: Home / Self Care | Source: Ambulatory Visit | Attending: Surgery | Admitting: Surgery

## 2023-10-11 DIAGNOSIS — Z01812 Encounter for preprocedural laboratory examination: Secondary | ICD-10-CM | POA: Insufficient documentation

## 2023-10-11 LAB — BASIC METABOLIC PANEL
Anion gap: 13 (ref 5–15)
BUN: 18 mg/dL (ref 8–23)
CO2: 25 mmol/L (ref 22–32)
Calcium: 9.5 mg/dL (ref 8.9–10.3)
Chloride: 98 mmol/L (ref 98–111)
Creatinine, Ser: 1.21 mg/dL (ref 0.61–1.24)
GFR, Estimated: >60 mL/min (ref >60)
Glucose, Bld: 150 mg/dL — ABNORMAL HIGH (ref 70–99)
Potassium: 4.6 mmol/L (ref 3.5–5.1)
Sodium: 136 mmol/L (ref 135–145)

## 2023-10-11 NOTE — Progress Notes (Signed)

## 2023-10-16 NOTE — H&P (Signed)
 Charles [name] is an 68 y.o. male.   Chief Complaint: Left hand numbness, tingling, pain and weakness HPI: The patient is a 68 year old gentleman with known severe left carpal tunnel syndrome verified with clinical exam and EMG/NCVS studies showing the severity of his carpal tunnel syndrome.  At this point we have recommended an open carpal tunnel release in order to help alleviate some of his symptoms.  Past Medical History:  Diagnosis Date   Acute medial meniscus tear of left knee 12/24/2019   Allergy    seasonal   Arthritis    CLL (chronic lymphoblastic leukemia) 08/03/2011   CLL (chronic lymphocytic leukemia) (HCC) 03/18/2012   GERD (gastroesophageal reflux disease)    Hyperlipidemia    Hypertension    Shingles    Ulcer     Past Surgical History:  Procedure Laterality Date   HAND SURGERY  1985   HERNIA REPAIR  1990   KNEE ARTHROSCOPY WITH LATERAL MENISECTOMY Left 12/24/2019   Procedure: KNEE ARTHROSCOPY WITH PARTIAL LATERAL MENISECTOMY;  Surgeon: Kathryne Hitch, MD;  Location: [facility];  Service: Orthopedics;  Laterality: Left;   KNEE SURGERY Right 2005   SHOULDER SURGERY Left 2007   TRANSFORAMINAL LUMBAR INTERBODY FUSION W/ MIS 2 LEVEL Right 06/05/2022   Procedure: MINIMALLY INVASIVE DECOMPRESSION ,TRANSFORAMINAL LUMBAR INTERBODY FUSION, RIGHT SIDED APPROACH LUMBAR THRE-FOUR, LUMBAR FOUR-FIVE;  Surgeon: Dawley, Alan Mulder, DO;  Location: [facility] OR;  Service: Neurosurgery;  Laterality: Right;   WRIST SURGERY Right     Family History  Problem Relation Age of Onset   Diabetes Mother    Hyperlipidemia Father    Hearing loss Father    Cancer Sister    Breast cancer Sister    Melanoma Sister    Cancer Brother 28       prostate cancer stage 4   Cancer Brother 90       prostate   Cancer Brother    Cancer - Colon Brother    Colon cancer Brother    Neuropathy Neg Hx    Social History:  reports that he quit smoking about 25 years ago. His smoking use  included cigarettes. He started smoking about 45 years ago. He has a 40 pack-year smoking history. He has never used smokeless tobacco. He reports current alcohol use. He reports that he does not use drugs.  Allergies: No Known Allergies  No medications prior to admission.    No results found for this or any previous visit (from the past 48 hours). No results found.  Review of Systems  Height 5\' 8"  (1.727 m), weight 112.5 kg. Physical Exam Vitals reviewed.  Constitutional:      Appearance: Normal appearance. He is obese.  HENT:     Head: Normocephalic and atraumatic.  Eyes:     Extraocular Movements: Extraocular movements intact.     Pupils: Pupils are equal, round, and reactive to light.  Cardiovascular:     Rate and Rhythm: Normal rate.  Pulmonary:     Effort: Pulmonary effort is normal.     Breath sounds: Normal breath sounds.  Abdominal:     Palpations: Abdomen is soft.  Musculoskeletal:     Left hand: Decreased strength. Decreased sensation of the median distribution.     Cervical back: Normal range of motion and neck supple.  Neurological:     Mental Status: He is alert and oriented to person, place, and time.  Psychiatric:        Behavior: Behavior normal.  Assessment/Plan Left upper extremity severe carpal tunnel syndrome  The plan is to proceed to surgery for a left open carpal tunnel release as an outpatient.  The patient understands fully the risk and benefits of the surgery and understands given the severity of his carpal tunnel syndrome that he will likely not get full recovery but at least hopefully this can make him more comfortable and decreased some of the symptoms.  Kathryne Hitch, MD 10/16/2023, 6:42 PM

## 2023-10-17 ENCOUNTER — Encounter (HOSPITAL_BASED_OUTPATIENT_CLINIC_OR_DEPARTMENT_OTHER)
Admission: RE | Disposition: A | Discharge status: Home / Self Care | Payer: Self-pay | Source: Home / Self Care | Attending: Orthopaedic Surgery

## 2023-10-17 ENCOUNTER — Encounter (HOSPITAL_BASED_OUTPATIENT_CLINIC_OR_DEPARTMENT_OTHER): Payer: Self-pay | Admitting: Orthopaedic Surgery

## 2023-10-17 ENCOUNTER — Ambulatory Visit (HOSPITAL_BASED_OUTPATIENT_CLINIC_OR_DEPARTMENT_OTHER): Admitting: Anesthesiology

## 2023-10-17 ENCOUNTER — Ambulatory Visit (HOSPITAL_BASED_OUTPATIENT_CLINIC_OR_DEPARTMENT_OTHER)
Admission: RE | Admit: 2023-10-17 | Discharge: 2023-10-17 | Disposition: A | Discharge status: Home / Self Care | Payer: Commercial Managed Care - PPO | Attending: Orthopaedic Surgery | Admitting: Orthopaedic Surgery

## 2023-10-17 ENCOUNTER — Other Ambulatory Visit (HOSPITAL_COMMUNITY): Payer: Self-pay

## 2023-10-17 ENCOUNTER — Other Ambulatory Visit: Payer: Self-pay

## 2023-10-17 DIAGNOSIS — Z87891 Personal history of nicotine dependence: Secondary | ICD-10-CM | POA: Diagnosis not present

## 2023-10-17 DIAGNOSIS — I10 Essential (primary) hypertension: Secondary | ICD-10-CM | POA: Insufficient documentation

## 2023-10-17 DIAGNOSIS — G5602 Carpal tunnel syndrome, left upper limb: Secondary | ICD-10-CM | POA: Diagnosis not present

## 2023-10-17 DIAGNOSIS — Z01818 Encounter for other preprocedural examination: Secondary | ICD-10-CM

## 2023-10-17 HISTORY — PX: CARPAL TUNNEL RELEASE: SHX101

## 2023-10-17 SURGERY — CARPAL TUNNEL RELEASE
Anesthesia: Monitor Anesthesia Care | Site: Wrist | Laterality: Left

## 2023-10-17 MED ORDER — OXYCODONE HCL 5 MG PO TABS: 5.0000 mg | ORAL_TABLET | Freq: Once | ORAL | Status: DC | PRN

## 2023-10-17 MED ORDER — PROPOFOL 500 MG/50ML IV EMUL
INTRAVENOUS | Status: DC | PRN
  Administered 2023-10-17: 100 ug/kg/min via INTRAVENOUS

## 2023-10-17 MED ORDER — CEFAZOLIN SODIUM-DEXTROSE 2-3 GM-%(50ML) IV SOLR
INTRAVENOUS | Status: DC | PRN
  Administered 2023-10-17: 2 g via INTRAVENOUS

## 2023-10-17 MED ORDER — ACETAMINOPHEN 500 MG PO TABS
1000.0000 mg | ORAL_TABLET | Freq: Once | ORAL | Status: AC
  Administered 2023-10-17: 1000 mg via ORAL

## 2023-10-17 MED ORDER — BUPIVACAINE HCL (PF) 0.25 % IJ SOLN
INTRAMUSCULAR | Status: AC
  Filled 2023-10-17: qty 150

## 2023-10-17 MED ORDER — CEFAZOLIN SODIUM-DEXTROSE 2-4 GM/100ML-% IV SOLN: 2.0000 g | INTRAVENOUS | Status: DC

## 2023-10-17 MED ORDER — LIDOCAINE HCL (PF) 1 % IJ SOLN
INTRAMUSCULAR | Status: DC | PRN
  Administered 2023-10-17: 30 mL

## 2023-10-17 MED ORDER — FENTANYL CITRATE (PF) 100 MCG/2ML IJ SOLN: 25.0000 ug | INTRAMUSCULAR | Status: DC | PRN

## 2023-10-17 MED ORDER — FENTANYL CITRATE (PF) 250 MCG/5ML IJ SOLN
INTRAMUSCULAR | Status: DC | PRN
  Administered 2023-10-17: 100 ug via INTRAVENOUS

## 2023-10-17 MED ORDER — AMISULPRIDE (ANTIEMETIC) 5 MG/2ML IV SOLN: 10.0000 mg | Freq: Once | INTRAVENOUS | Status: DC | PRN

## 2023-10-17 MED ORDER — OXYCODONE HCL 5 MG/5ML PO SOLN: 5.0000 mg | Freq: Once | ORAL | Status: DC | PRN

## 2023-10-17 MED ORDER — FENTANYL CITRATE (PF) 100 MCG/2ML IJ SOLN
INTRAMUSCULAR | Status: AC
Start: 2023-10-17 — End: ?
  Filled 2023-10-17: qty 2

## 2023-10-17 MED ORDER — BUPIVACAINE HCL (PF) 0.25 % IJ SOLN
INTRAMUSCULAR | Status: DC | PRN
  Administered 2023-10-17: 10 mL

## 2023-10-17 MED ORDER — ONDANSETRON HCL 4 MG/2ML IJ SOLN
INTRAMUSCULAR | Status: DC | PRN
  Administered 2023-10-17: 4 mg via INTRAVENOUS

## 2023-10-17 MED ORDER — CEFAZOLIN SODIUM-DEXTROSE 2-4 GM/100ML-% IV SOLN
INTRAVENOUS | Status: AC
  Filled 2023-10-17: qty 100

## 2023-10-17 MED ORDER — DEXAMETHASONE SODIUM PHOSPHATE 10 MG/ML IJ SOLN
INTRAMUSCULAR | Status: DC | PRN
  Administered 2023-10-17: 4 mg via INTRAVENOUS

## 2023-10-17 MED ORDER — LIDOCAINE HCL (PF) 1 % IJ SOLN
INTRAMUSCULAR | Status: AC
  Filled 2023-10-17: qty 30

## 2023-10-17 MED ORDER — MIDAZOLAM HCL 2 MG/2ML IJ SOLN
INTRAMUSCULAR | Status: DC | PRN
Start: 2023-10-17 — End: 2023-10-17
  Administered 2023-10-17: 2 mg via INTRAVENOUS

## 2023-10-17 MED ORDER — 0.9 % SODIUM CHLORIDE (POUR BTL) OPTIME
TOPICAL | Status: DC | PRN
  Administered 2023-10-17: 100 mL

## 2023-10-17 MED ORDER — ACETAMINOPHEN 500 MG PO TABS
ORAL_TABLET | ORAL | Status: AC
  Filled 2023-10-17: qty 2

## 2023-10-17 MED ORDER — MIDAZOLAM HCL 2 MG/2ML IJ SOLN
INTRAMUSCULAR | Status: AC
Start: 2023-10-17 — End: ?
  Filled 2023-10-17: qty 2

## 2023-10-17 MED ORDER — HYDROCODONE-ACETAMINOPHEN 5-325 MG PO TABS
1.0000 | ORAL_TABLET | Freq: Four times a day (QID) | ORAL | 0 refills | Status: DC | PRN
  Filled 2023-10-17: qty 20, 3d supply, fill #0

## 2023-10-17 MED ORDER — LACTATED RINGERS IV SOLN: INTRAVENOUS | Status: DC

## 2023-10-17 SURGICAL SUPPLY — 34 items
BLADE SURG 15 STRL LF DISP TIS (BLADE) ×2 IMPLANT
BNDG ELASTIC 3INX 5YD STR LF (GAUZE/BANDAGES/DRESSINGS) ×1 IMPLANT
BNDG ESMARK 4X9 LF (GAUZE/BANDAGES/DRESSINGS) IMPLANT
CORD BIPOLAR FORCEPS 12FT (ELECTRODE) ×1 IMPLANT
COVER BACK TABLE 60X90IN (DRAPES) ×1 IMPLANT
COVER MAYO STAND STRL (DRAPES) ×1 IMPLANT
CUFF TOURN SGL QUICK 18X4 (TOURNIQUET CUFF) IMPLANT
CUFF TRNQT CYL 24X4X16.5-23 (TOURNIQUET CUFF) IMPLANT
DRAPE EXTREMITY T 121X128X90 (DISPOSABLE) ×1 IMPLANT
DRAPE SURG 17X23 STRL (DRAPES) ×1 IMPLANT
DURAPREP 26ML APPLICATOR (WOUND CARE) ×1 IMPLANT
GAUZE SPONGE 4X4 12PLY STRL (GAUZE/BANDAGES/DRESSINGS) ×1 IMPLANT
GAUZE XEROFORM 1X8 LF (GAUZE/BANDAGES/DRESSINGS) ×1 IMPLANT
GLOVE BIO SURGEON STRL SZ7.5 (GLOVE) ×2 IMPLANT
GLOVE BIOGEL PI IND STRL 8 (GLOVE) ×2 IMPLANT
GOWN STRL REUS W/ TWL LRG LVL3 (GOWN DISPOSABLE) ×1 IMPLANT
GOWN STRL REUS W/ TWL XL LVL3 (GOWN DISPOSABLE) IMPLANT
GOWN STRL REUS W/TWL XL LVL3 (GOWN DISPOSABLE) ×2 IMPLANT
KNIFE CARPAL TUNNEL (BLADE) IMPLANT
LOOP VASCLR MAXI BLUE 18IN ST (MISCELLANEOUS) IMPLANT
NDL HYPO 25X1 1.5 SAFETY (NEEDLE) IMPLANT
NEEDLE HYPO 25X1 1.5 SAFETY (NEEDLE) ×1 IMPLANT
NS IRRIG 1000ML POUR BTL (IV SOLUTION) ×1 IMPLANT
PACK BASIN DAY SURGERY FS (CUSTOM PROCEDURE TRAY) ×1 IMPLANT
PAD CAST 3X4 CTTN HI CHSV (CAST SUPPLIES) ×1 IMPLANT
PADDING CAST ABS COTTON 4X4 ST (CAST SUPPLIES) ×1 IMPLANT
SLEEVE SCD COMPRESS KNEE MED (STOCKING) IMPLANT
STOCKINETTE 4X48 STRL (DRAPES) ×1 IMPLANT
SUT ETHILON 3 0 PS 1 (SUTURE) ×1 IMPLANT
SYR BULB EAR ULCER 3OZ GRN STR (SYRINGE) ×1 IMPLANT
SYR CONTROL 10ML LL (SYRINGE) IMPLANT
TIE VASCULAR MAXI BLUE 18IN ST (MISCELLANEOUS) IMPLANT
TOWEL GREEN STERILE FF (TOWEL DISPOSABLE) ×1 IMPLANT
UNDERPAD 30X36 HEAVY ABSORB (UNDERPADS AND DIAPERS) ×1 IMPLANT

## 2023-10-17 NOTE — Anesthesia Procedure Notes (Signed)
 Procedure Name: MAC Date/Time: 10/17/2023 7:33 AM  Performed by: Alvera Novel, CRNAPre-anesthesia Checklist: Patient identified, Emergency Drugs available, Patient being monitored, Timeout performed and Suction available Patient Re-evaluated:Patient Re-evaluated prior to induction Oxygen Delivery Method: Simple face mask

## 2023-10-17 NOTE — Interval H&P Note (Signed)
 History and Physical Interval Note: The patient understands he is here today for a left open carpal tunnel release left carpal tunnel syndrome.  There has been no acute or interval change in his medical status.  The risks and benefits of surgery have been discussed in detail and informed consent has been obtained. The left operative hand and wrist has been marked.  10/17/2023 7:10 AM  Charles [name]  has presented today for surgery, with the diagnosis of left carpal tunnel syndrome.  The various methods of treatment have been discussed with the patient and family. After consideration of risks, benefits and other options for treatment, the patient has consented to  Procedure(s): LEFT CARPAL TUNNEL RELEASE (Left) as a surgical intervention.  The patient's history has been reviewed, patient examined, no change in status, stable for surgery.  I have reviewed the patient's chart and labs.  Questions were answered to the patient's satisfaction.     Kathryne Hitch

## 2023-10-17 NOTE — Discharge Instructions (Addendum)
 You may use your left hand as comfort allows. Do expect some hand swelling. Ice and elevation as needed for swelling. Keep your current dressing clean and dry for the next 7 days. In 7 days you can remove your dressing and get your incision wet in the shower. Starting in 7 days you should place large Band-Aids over your incision on a daily basis to protect the sutures. No heavy gripping or lifting with your left hand but you can move your fingers and thumb as comfort allows.   Post Anesthesia Home Care Instructions  Activity: Get plenty of rest for the remainder of the day. A responsible individual must stay with you for 24 hours following the procedure.  For the next 24 hours, DO NOT: -Drive a car -Advertising copywriter -Drink alcoholic beverages -Take any medication unless instructed by your physician -Make any legal decisions or sign important papers.  Meals: Start with liquid foods such as gelatin or soup. Progress to regular foods as tolerated. Avoid greasy, spicy, heavy foods. If nausea and/or vomiting occur, drink only clear liquids until the nausea and/or vomiting subsides. Call your physician if vomiting continues.  Special Instructions/Symptoms: Your throat may feel dry or sore from the anesthesia or the breathing tube placed in your throat during surgery. If this causes discomfort, gargle with warm salt water. The discomfort should disappear within 24 hours.  If you had a scopolamine patch placed behind your ear for the management of post- operative nausea and/or vomiting:  1. The medication in the patch is effective for 72 hours, after which it should be removed.  Wrap patch in a tissue and discard in the trash. Wash hands thoroughly with soap and water. 2. You may remove the patch earlier than 72 hours if you experience unpleasant side effects which may include dry mouth, dizziness or visual disturbances. 3. Avoid touching the patch. Wash your hands with soap and water after  contact with the patch.     May have Tylenol today after 1:00 PM

## 2023-10-17 NOTE — Op Note (Signed)
 Operative Note  Date of operation: 10/17/2023 Preoperative diagnosis: Left severe carpal tunnel syndrome Postoperative diagnosis: Same  Procedure: Left open carpal tunnel release  Surgeon: Vanita Panda. Magnus Ivan, MD  Anesthesia: #1 MAC with IV sedation, #2 local with plain Marcaine and lidocaine  Tourniquet time: Under 15 minutes EBL: Minimal Complications: None  Indications: Charles [name] is a pleasant 68 year old gentleman who unfortunately has severe carpal tunnel syndrome of his bilateral upper extremities confirmed from EMG/nerve conduction velocity studies performed 2 years ago.  He is at the point where he does have daily pain with his left hand as well as numbness and tingling and weakness.  He wishes to proceed with open carpal tunnel release.  We did describe the risks of acute blood loss anemia, nerve or vessel injury, wound healing issues and the fact that he would not likely get all of his function back given the severity of his carpal tunnel syndrome combined with how long has been going on and his age.  Procedure description: After informed consent was obtained and the appropriate left hand and wrist were marked, the patient is brought the operating placed upon the operating table with his left arm on arm table.  A forearm tourniquet was placed in his left hand and wrist were prepped and draped with DuraPrep and sterile drapes.  A timeout was called and he was identified as the correct patient correct left hand.  Moderate sedation was then given and the tourniquet was inflated to 250 mm of pressure.  We then anesthetized the palm of the hand where the incision was being made with a mixture of plain lidocaine and plain Marcaine.  I then made incision over the transverse carpal ligament and carried this proximally distally in the palm of the hand.  We dissected down to the distal edge of the transverse carpal ligament and then slowly and meticulously divided it from distal to proximal making  sure we divided in its entirety.  We explore the median nerve and found it to be intact as well as his motor branch.  We then irrigate the wound with normal saline solution.  The skin was reapproximated with interrupted 3-0 nylon suture.  Xeroform well-padded sterile dressings applied.  The tourniquet was let down and his fingers pink and nicely.  The patient was taken recovery room in stable condition.  Postoperatively we will give him wound care instructions and instructions of when to use his hand.  Follow-up will be in 2 weeks in the office.

## 2023-10-17 NOTE — Anesthesia Preprocedure Evaluation (Addendum)
 Anesthesia Evaluation  Patient identified by MRN, date of birth, ID band Patient awake    Reviewed: Allergy & Precautions, NPO status , Patient's Chart, lab work & pertinent test results  Airway Mallampati: I  TM Distance: >3 FB Neck ROM: Full    Dental  (+) Edentulous Upper, Missing, Dental Advisory Given,    Pulmonary former smoker   Pulmonary exam normal breath sounds clear to auscultation       Cardiovascular hypertension, Pt. on medications Normal cardiovascular exam Rhythm:Regular Rate:Normal     Neuro/Psych negative neurological ROS  negative psych ROS   GI/Hepatic Neg liver ROS,GERD  ,,  Endo/Other  negative endocrine ROS    Renal/GU negative Renal ROS  negative genitourinary   Musculoskeletal negative musculoskeletal ROS (+)    Abdominal   Peds  Hematology negative hematology ROS (+) CLL   Anesthesia Other Findings   Reproductive/Obstetrics                             Anesthesia Physical Anesthesia Plan  ASA: 2  Anesthesia Plan: MAC   Post-op Pain Management: Tylenol PO (pre-op)*   Induction: Intravenous  PONV Risk Score and Plan: 1 and Propofol infusion, Treatment may vary due to age or medical condition, Midazolam, Ondansetron and Dexamethasone  Airway Management Planned: Natural Airway  Additional Equipment:   Intra-op Plan:   Post-operative Plan:   Informed Consent: I have reviewed the patients History and Physical, chart, labs and discussed the procedure including the risks, benefits and alternatives for the proposed anesthesia with the patient or authorized representative who has indicated his/her understanding and acceptance.     Dental advisory given  Plan Discussed with: CRNA  Anesthesia Plan Comments:        Anesthesia Quick Evaluation

## 2023-10-17 NOTE — Anesthesia Postprocedure Evaluation (Signed)
 Anesthesia Post Note  Patient: Charles [name]  Procedure(s) Performed: LEFT CARPAL TUNNEL RELEASE (Left: Wrist)     Patient location during evaluation: PACU Anesthesia Type: MAC Level of consciousness: awake and alert Pain management: pain level controlled Vital Signs Assessment: post-procedure vital signs reviewed and stable Respiratory status: spontaneous breathing, nonlabored ventilation, respiratory function stable and patient connected to nasal cannula oxygen Cardiovascular status: stable and blood pressure returned to baseline Postop Assessment: no apparent nausea or vomiting Anesthetic complications: no  No notable events documented.  Last Vitals:  Vitals:   10/17/23 0815 10/17/23 0838  BP: 103/70 129/70  Pulse: (!) 52 (!) 56  Resp: 10   Temp:  (!) 36.3 C  SpO2: 95% 96%    Last Pain:  Vitals:   10/17/23 0838  TempSrc: Temporal  PainSc: 0-No pain                 Geoge Lawrance L Elysha Daw

## 2023-10-17 NOTE — Transfer of Care (Signed)
 Immediate Anesthesia Transfer of Care Note  Patient: Charles [name]  Procedure(s) Performed: LEFT CARPAL TUNNEL RELEASE (Left: Wrist)  Patient Location: PACU  Anesthesia Type:General  Level of Consciousness: drowsy and patient cooperative  Airway & Oxygen Therapy: Patient Spontanous Breathing and Patient connected to face mask oxygen  Post-op Assessment: Report given to RN and Post -op Vital signs reviewed and stable  Post vital signs: Reviewed and stable  Last Vitals:  Vitals Value Taken Time  BP 96/61 (76)   Temp    Pulse 55 10/17/23 0758  Resp 12 10/17/23 0758  SpO2 97 % 10/17/23 0758  Vitals shown include unfiled device data.  Last Pain:  Vitals:   10/17/23 0647  TempSrc: Temporal  PainSc: 0-No pain         Complications: No notable events documented.

## 2023-10-18 ENCOUNTER — Encounter (HOSPITAL_BASED_OUTPATIENT_CLINIC_OR_DEPARTMENT_OTHER): Payer: Self-pay | Admitting: Orthopaedic Surgery

## 2023-10-29 ENCOUNTER — Other Ambulatory Visit: Payer: Self-pay | Admitting: Family Medicine

## 2023-10-30 ENCOUNTER — Other Ambulatory Visit (HOSPITAL_COMMUNITY): Payer: Self-pay

## 2023-10-30 ENCOUNTER — Other Ambulatory Visit: Payer: Self-pay

## 2023-10-30 MED ORDER — MELOXICAM 15 MG PO TABS
15.0000 mg | ORAL_TABLET | Freq: Every day | ORAL | 0 refills | Status: DC
  Filled 2023-10-30: qty 90, 90d supply, fill #0

## 2023-10-31 ENCOUNTER — Ambulatory Visit (INDEPENDENT_AMBULATORY_CARE_PROVIDER_SITE_OTHER): Payer: Commercial Managed Care - PPO | Admitting: Orthopaedic Surgery

## 2023-10-31 ENCOUNTER — Encounter: Payer: Self-pay | Admitting: Orthopaedic Surgery

## 2023-10-31 DIAGNOSIS — Z9889 Other specified postprocedural states: Secondary | ICD-10-CM

## 2023-10-31 NOTE — Progress Notes (Signed)
 The patient is here for his first postoperative visit status post a left open carpal tunnel release.  He is doing well overall.  He does develop some hand swelling and wrist swelling.  He has severe carpal tunnel syndrome.  He said he started to have some sensory improvements.  He does have known severe carpal tunnel syndrome on the right side and he did injure his shoulder on that right side earlier this year when he fell and ankle in that weather.  His left hand incision looks good.  We removed sutures in place Steri-Strips.  He can use his hand as comfort allows.  Will see him back in a month to see how his left hand is doing.  If there is issues before then he knows to let us know.  At that visit in a month from now we can always get 3 views of his right shoulder if he still having shoulder issues.

## 2023-11-04 ENCOUNTER — Other Ambulatory Visit (HOSPITAL_COMMUNITY): Payer: Self-pay

## 2023-11-04 MED ORDER — NYSTATIN 100000 UNIT/ML MT SUSP
Freq: Two times a day (BID) | OROMUCOSAL | 0 refills | Status: DC
  Filled 2023-11-04: qty 120, 30d supply, fill #0

## 2023-11-04 MED ORDER — LIDOCAINE VISCOUS HCL 2 % MT SOLN
Freq: Two times a day (BID) | ORAL | 0 refills | Status: DC
Start: 2023-11-04 — End: 2023-12-11
  Filled 2023-11-05: qty 120, 6d supply, fill #0

## 2023-11-05 ENCOUNTER — Other Ambulatory Visit (HOSPITAL_COMMUNITY): Payer: Self-pay

## 2023-11-07 ENCOUNTER — Other Ambulatory Visit (HOSPITAL_COMMUNITY): Payer: Self-pay

## 2023-11-13 ENCOUNTER — Other Ambulatory Visit: Payer: Self-pay | Admitting: Family Medicine

## 2023-11-13 ENCOUNTER — Other Ambulatory Visit (HOSPITAL_COMMUNITY): Payer: Self-pay

## 2023-11-13 ENCOUNTER — Other Ambulatory Visit: Payer: Self-pay

## 2023-11-13 DIAGNOSIS — I10 Essential (primary) hypertension: Secondary | ICD-10-CM

## 2023-11-13 MED ORDER — LISINOPRIL-HYDROCHLOROTHIAZIDE 20-25 MG PO TABS
1.0000 | ORAL_TABLET | Freq: Every day | ORAL | 1 refills | Status: DC
  Filled 2023-11-13: qty 90, 90d supply, fill #0
  Filled 2024-02-10: qty 90, 90d supply, fill #1

## 2023-11-13 MED ORDER — ROSUVASTATIN CALCIUM 20 MG PO TABS
20.0000 mg | ORAL_TABLET | Freq: Every evening | ORAL | 0 refills | Status: DC
  Filled 2023-11-13: qty 90, 90d supply, fill #0

## 2023-11-19 ENCOUNTER — Encounter: Payer: Self-pay | Admitting: Family Medicine

## 2023-11-19 ENCOUNTER — Ambulatory Visit (INDEPENDENT_AMBULATORY_CARE_PROVIDER_SITE_OTHER): Payer: Commercial Managed Care - PPO | Admitting: Family Medicine

## 2023-11-19 VITALS — BP 132/64 | HR 57 | Temp 97.8°F | Resp 16 | Ht 68.0 in | Wt 241.2 lb

## 2023-11-19 DIAGNOSIS — R7303 Prediabetes: Secondary | ICD-10-CM

## 2023-11-19 DIAGNOSIS — E785 Hyperlipidemia, unspecified: Secondary | ICD-10-CM | POA: Diagnosis not present

## 2023-11-19 DIAGNOSIS — Z125 Encounter for screening for malignant neoplasm of prostate: Secondary | ICD-10-CM | POA: Diagnosis not present

## 2023-11-19 DIAGNOSIS — H6121 Impacted cerumen, right ear: Secondary | ICD-10-CM

## 2023-11-19 DIAGNOSIS — Z0000 Encounter for general adult medical examination without abnormal findings: Secondary | ICD-10-CM | POA: Diagnosis not present

## 2023-11-19 LAB — COMPREHENSIVE METABOLIC PANEL WITH GFR
ALT: 13 U/L (ref 0–53)
AST: 19 U/L (ref 0–37)
Albumin: 4.7 g/dL (ref 3.5–5.2)
Alkaline Phosphatase: 72 U/L (ref 39–117)
BUN: 17 mg/dL (ref 6–23)
CO2: 30 meq/L (ref 19–32)
Calcium: 9.8 mg/dL (ref 8.4–10.5)
Chloride: 97 meq/L (ref 96–112)
Creatinine, Ser: 1.23 mg/dL (ref 0.40–1.50)
GFR: 60.09 mL/min (ref >60.00)
Glucose, Bld: 114 mg/dL — ABNORMAL HIGH (ref 70–99)
Potassium: 4.1 meq/L (ref 3.5–5.1)
Sodium: 136 meq/L (ref 135–145)
Total Bilirubin: 0.9 mg/dL (ref 0.2–1.2)
Total Protein: 7 g/dL (ref 6.0–8.3)

## 2023-11-19 LAB — LIPID PANEL
Cholesterol: 131 mg/dL (ref 0–200)
HDL: 34 mg/dL — ABNORMAL LOW (ref >39.00)
LDL Cholesterol: 66 mg/dL (ref 0–99)
NonHDL: 97.12
Total CHOL/HDL Ratio: 4
Triglycerides: 156 mg/dL — ABNORMAL HIGH (ref 0.0–149.0)
VLDL: 31.2 mg/dL (ref 0.0–40.0)

## 2023-11-19 LAB — CBC WITH DIFFERENTIAL/PLATELET
Basophils Absolute: 0.1 10*3/uL (ref 0.0–0.1)
Basophils Relative: 0.5 % (ref 0.0–3.0)
Eosinophils Absolute: 0.2 10*3/uL (ref 0.0–0.7)
Eosinophils Relative: 1.5 % (ref 0.0–5.0)
HCT: 46.9 % (ref 39.0–52.0)
Hemoglobin: 15.8 g/dL (ref 13.0–17.0)
Lymphocytes Relative: 50.9 % — ABNORMAL HIGH (ref 12.0–46.0)
Lymphs Abs: 5.5 10*3/uL — ABNORMAL HIGH (ref 0.7–4.0)
MCHC: 33.6 g/dL (ref 30.0–36.0)
MCV: 95.2 fl (ref 78.0–100.0)
Monocytes Absolute: 0.6 10*3/uL (ref 0.1–1.0)
Monocytes Relative: 5.1 % (ref 3.0–12.0)
Neutro Abs: 4.5 10*3/uL (ref 1.4–7.7)
Neutrophils Relative %: 42 % — ABNORMAL LOW (ref 43.0–77.0)
Platelets: 194 10*3/uL (ref 150.0–400.0)
RBC: 4.93 Mil/uL (ref 4.22–5.81)
RDW: 13.7 % (ref 11.5–15.5)
WBC: 10.8 10*3/uL — ABNORMAL HIGH (ref 4.0–10.5)

## 2023-11-19 LAB — TSH: TSH: 2.06 u[IU]/mL (ref 0.35–5.50)

## 2023-11-19 LAB — HEMOGLOBIN A1C: Hgb A1c MFr Bld: 6.4 % (ref 4.6–6.5)

## 2023-11-19 LAB — PSA: PSA: 0.52 ng/mL (ref 0.10–4.00)

## 2023-11-19 NOTE — Progress Notes (Signed)
 Complete physical exam  Patient: Charles [name]   DOB: Aug 25, 1955   68 y.o. Male  MRN: [id number]  Subjective:    Chief Complaint  Patient presents with   Annual Exam    Charles [name] is a 68 y.o. male who presents today for a complete physical exam. He reports consuming a general diet. The patient has a physically strenuous job, but has no regular exercise apart from work.  He generally feels well. He reports sleeping well. He does not have additional problems to discuss today.   Currently lives with: wife Acute concerns or interim problems since last visit: no  Vision concerns: following with eye doctor for cataracts Dental concerns: no STD concerns: no  ETOH use: rare Nicotine use: no Recreational drugs/illegal substances: no   PSA: - The natural history of prostate cancer and ongoing controversy regarding screening and potential treatment outcomes of prostate cancer has been discussed with the patient. The meaning of a false positive PSA and a false negative PSA has been discussed. He indicates understanding of the limitations of this screening test and wishes  to proceed with screening PSA testing.       Most recent fall risk assessment:    11/19/2023   11:24 AM  Fall Risk   Falls in the past year? 1  Number falls in past yr: 0  Injury with Fall? 1  Risk for fall due to : No Fall Risks  Follow up Falls evaluation completed     Most recent depression screenings:    11/19/2023   11:25 AM 11/05/2016    8:59 AM  PHQ 2/9 Scores  PHQ - 2 Score 0      Information is confidential and restricted. Go to Review Flowsheets to unlock data.            Patient Care Team: Everlina Hock, NP as PCP - General (Family Medicine) Maria Shiner Sherryll Donald, MD as Consulting Physician (Oncology) Glory Larsen, MD as Consulting Physician (Dermatology) Clemetine Cypher, [place] as Consulting Physician (Podiatry) Janel Medford, MD (Inactive) as Attending Physician  (Gastroenterology) Dawley, Colby Daub, DO as Consulting Physician Arnie Lao, MD as Consulting Physician (Orthopedic Surgery) Glory Larsen, MD as Consulting Physician (Neurology)   Outpatient Medications Prior to Visit  Medication Sig   acetaminophen  (TYLENOL ) 500 MG tablet Take 1,000-1,500 mg by mouth daily.   aspirin  EC (ASPIRIN  LOW DOSE) 81 MG tablet Take 1 tablet (81 mg total) by mouth daily.   cetirizine (ZYRTEC) 10 MG tablet Take 10 mg by mouth daily.   Cholecalciferol (VITAMIN D3) 2000 UNITS TABS Take 2,000 Units by mouth.   esomeprazole  (NEXIUM ) 40 MG capsule Take 1 capsule (40 mg total) by mouth daily.   fluticasone  (FLONASE ) 50 MCG/ACT nasal spray Place 1 spray into both nostrils daily.   HYDROcodone -acetaminophen  (NORCO/VICODIN) 5-325 MG tablet Take 1-2 tablets by mouth every 6 (six) hours as needed for moderate pain (pain score 4-6).   ketoconazole  (NIZORAL ) 2 % cream Apply topically 2 (two) times daily.   lisinopril -hydrochlorothiazide  (ZESTORETIC ) 20-25 MG tablet Take 1 tablet by mouth daily.   magic mouthwash (lidocaine , diphenhydrAMINE , alum & mag hydroxide) suspension Swish and spit 10 mls by mouth every morning and every evening.   meloxicam  (MOBIC ) 15 MG tablet Take 1 tablet (15 mg total) by mouth daily. Appt for further refills   methocarbamol  (ROBAXIN ) 750 MG tablet Take 1 tablet (750 mg total) by mouth 3 (three) times daily as needed for muscle  spasms   rosuvastatin  (CRESTOR ) 20 MG tablet Take 1 tablet (20 mg total) by mouth at bedtime.   No facility-administered medications prior to visit.    ROS All review of systems negative except what is listed in the HPI        Objective:     BP 132/64 (BP Location: Right Arm, Patient Position: Sitting, Cuff Size: Large)   Pulse (!) 57   Temp 97.8 F (36.6 C) (Oral)   Resp 16   Ht 5\' 8"  (1.727 m)   Wt 241 lb 3.2 oz (109.4 kg)   SpO2 95%   BMI 36.67 kg/m    Physical Exam Vitals reviewed.   Constitutional:      General: He is not in acute distress.    Appearance: Normal appearance. He is not ill-appearing.  HENT:     Head: Normocephalic and atraumatic.     Right Ear: There is impacted cerumen.     Left Ear: Tympanic membrane normal.     Nose: Nose normal.     Mouth/Throat:     Mouth: Mucous membranes are moist.     Pharynx: Oropharynx is clear.  Eyes:     Extraocular Movements: Extraocular movements intact.     Conjunctiva/sclera: Conjunctivae normal.     Pupils: Pupils are equal, round, and reactive to light.  Neck:     Vascular: No carotid bruit.  Cardiovascular:     Rate and Rhythm: Normal rate and regular rhythm.     Pulses: Normal pulses.     Heart sounds: Normal heart sounds.  Pulmonary:     Effort: Pulmonary effort is normal.     Breath sounds: Normal breath sounds.  Abdominal:     General: Abdomen is flat. Bowel sounds are normal. There is no distension.     Palpations: Abdomen is soft. There is no mass.     Tenderness: There is no abdominal tenderness. There is no right CVA tenderness, left CVA tenderness, guarding or rebound.  Genitourinary:    Comments: Deferred exam Musculoskeletal:        General: Normal range of motion.     Cervical back: Normal range of motion and neck supple. No tenderness.     Right lower leg: No edema.     Left lower leg: No edema.  Lymphadenopathy:     Cervical: No cervical adenopathy.  Skin:    General: Skin is warm and dry.     Capillary Refill: Capillary refill takes less than 2 seconds.  Neurological:     General: No focal deficit present.     Mental Status: He is alert and oriented to person, place, and time. Mental status is at baseline.  Psychiatric:        Mood and Affect: Mood normal.        Behavior: Behavior normal.        Thought Content: Thought content normal.        Judgment: Judgment normal.          No results found for any visits on 11/19/23.     Assessment & Plan:    Routine Health  Maintenance and Physical Exam Discussed health promotion and safety including diet and exercise recommendations, dental health, and injury prevention. Tobacco cessation if applicable. Seat belts, sunscreen, smoke detectors, etc.    Immunization History  Administered Date(s) Administered   Fluad Quad(high Dose 65+) 04/20/2022   Hepatitis B 07/23/1984   Hepatitis B, ADULT 07/23/1984   Hepatitis B, PED/ADOLESCENT 07/23/1984  Influenza Split 04/10/2012, 08/01/2013, 04/27/2015   Influenza, Seasonal, Injecte, Preservative Fre 04/22/2016, 04/15/2017, 04/22/2018   Influenza,inj,Quad PF,6+ Mos 04/16/2020, 04/22/2021   Influenza-Unspecified 04/22/2016, 04/15/2017, 04/27/2019, 05/14/2023   PFIZER(Purple Top)SARS-COV-2 Vaccination 07/15/2019, 08/05/2019, 04/30/2020   Pneumococcal Conjugate-13 11/05/2016   Pneumococcal Polysaccharide-23 08/31/2020   Tdap 04/22/2012   Zoster Recombinant(Shingrix ) 07/25/2020, 11/01/2020, 11/17/2020    Health Maintenance  Topic Date Due   COVID-19 Vaccine (4 - 2024-25 season) 05/19/2024 (Originally 03/24/2023)   DTaP/Tdap/Td (2 - Td or Tdap) 05/20/2024 (Originally 04/22/2022)   INFLUENZA VACCINE  02/21/2024   Colonoscopy  07/05/2025   Pneumonia Vaccine 92+ Years old  Completed   Hepatitis C Screening  Completed   Zoster Vaccines- Shingrix   Completed   HPV VACCINES  Aged Out   Meningococcal B Vaccine  Aged Out   Fecal DNA (Cologuard)  Discontinued        Problem List Items Addressed This Visit       Active Problems   Severe obesity (BMI 35.0-39.9) with comorbidity (HCC)   Relevant Orders   Comprehensive metabolic panel with GFR   Hemoglobin A1c   Lipid panel   TSH   Hyperlipidemia   Relevant Orders   Comprehensive metabolic panel with GFR   Lipid panel   TSH   Other Visit Diagnoses       Annual physical exam    -  Primary   Relevant Orders   CBC with Differential/Platelet   Comprehensive metabolic panel with GFR   Hemoglobin A1c   Lipid  panel   TSH   PSA     Screening for prostate cancer       Relevant Orders   PSA     Prediabetes       Relevant Orders   Hemoglobin A1c     Impacted cerumen of right ear     Indication: Cerumen impaction of the ear(s)  Medical necessity statement: On physical examination, cerumen impairs clinically significant portions of the external auditory canal, and tympanic membrane. Noted obstructive, copious cerumen that cannot be removed without magnification and instrumentations requiring professional removal.   Consent: Discussed benefits and risks of procedure and verbal consent obtained  Procedure: Patient was prepped for the procedure. Otoscope utilized to assess and take note of the ear canal, the tympanic membrane, and the presence, amount, and placement of the cerumen.  Liquid docusate sodium  instilled into the ear canal(s) and allowed to penetrate for 5 minutes before drainage by gravity. Gentle irrigation with water at body temperature and soft plastic curette utilized to remove impacted cerumen.  Excess water drained by gravity and ear canal(s) dried with clean guaze.  Post procedure examination: Otoscopic examination reveals complete cerumen removal with no damage to the auditory canal, tympanic membrane, or surrounding tissue.  Patient tolerated procedure well.   Post procedure instructions: Patient made aware that they may experience temporary vertigo, temporary changes in hearing, and temporary discomfort. If these symptom last for more than 24 hours to call the clinic or proceed to the ED for further evaluation. Discussed avoiding placing objects into the ear canal for cleaning.         PATIENT COUNSELING:    Sexuality: Discussed sexually transmitted diseases, partner selection, use of condoms, avoidance of unintended pregnancy, and contraceptive alternatives.   I discussed with the patient that most people either abstain from alcohol or drink within safe limits (<=14/week  and <=4 drinks/occasion for males, <=7/weeks and <= 3 drinks/occasion for females) and that the risk for alcohol  disorders and other health effects rises proportionally with the number of drinks per week and how often a drinker exceeds daily limits.  Discussed cessation/primary prevention of drug use and availability of treatment for abuse.   Diet: Encouraged to adjust caloric intake to maintain or achieve ideal body weight, to reduce intake of dietary saturated fat and total fat, to limit sodium intake by avoiding high sodium foods and not adding table salt, and to maintain adequate dietary potassium and calcium  preferably from fresh fruits, vegetables, and low-fat dairy products. Encouraged vitamin D 1000 units and Calcium  1300mg  or 4 servings of dairy a day.  Emphasized the importance of regular exercise.  Injury prevention: Discussed safety belts, safety helmets, smoke detector, smoking near bedding or upholstery.   Dental health: Discussed importance of regular tooth brushing, flossing, and dental visits.    Return in about 6 months (around 05/20/2024) for routine follow-up.     Everlina Hock, NP

## 2023-11-20 ENCOUNTER — Encounter: Payer: Self-pay | Admitting: Family Medicine

## 2023-11-28 ENCOUNTER — Ambulatory Visit: Admitting: Orthopaedic Surgery

## 2023-11-28 ENCOUNTER — Encounter: Payer: Self-pay | Admitting: Orthopaedic Surgery

## 2023-11-28 DIAGNOSIS — Z9889 Other specified postprocedural states: Secondary | ICD-10-CM

## 2023-11-28 NOTE — Progress Notes (Signed)
 The patient is a 68 year old who is here at 6 weeks status post a left open carpal tunnel release due to severe left carpal tunnel syndrome.  He does have significant carpal tunnel on the right side.  He said he would like to consider surgery on that side but not until the fall or winter when he may have to address issues with his right shoulder.  He says he is doing better overall and he does feel improvements each week with his hand.  His incision in his left palm is healing nicely.  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  He says he woke up today without numbness and tingling but it has waxed and waned.  His disease was very severe and he knows that recovery can take 6 months to a year and he may not get full recovery.  From my standpoint we will see him back in 4 months.  If he still having shoulder issues we need 3 views of his right shoulder.  We can then also talk about other options for him and look into the possibility of surgery on the right shoulder.  He has remote history of rotator cuff surgery on the left side.  He says he is reaching overhead better and overall is doing well.  He had fallen and injured that shoulder back in the wintertime.

## 2023-12-11 ENCOUNTER — Other Ambulatory Visit (HOSPITAL_COMMUNITY): Payer: Self-pay

## 2023-12-11 MED ORDER — LIDOCAINE VISCOUS HCL 2 % MT SOLN
Freq: Two times a day (BID) | OROMUCOSAL | 1 refills | Status: DC
  Filled 2023-12-11: qty 120, 14d supply, fill #0
  Filled 2023-12-24: qty 120, 6d supply, fill #0
  Filled 2024-01-27: qty 120, 6d supply, fill #1

## 2023-12-12 ENCOUNTER — Other Ambulatory Visit (HOSPITAL_COMMUNITY): Payer: Self-pay

## 2023-12-19 ENCOUNTER — Other Ambulatory Visit (HOSPITAL_COMMUNITY): Payer: Self-pay

## 2023-12-24 ENCOUNTER — Other Ambulatory Visit: Payer: Self-pay | Admitting: Family Medicine

## 2023-12-24 ENCOUNTER — Other Ambulatory Visit: Payer: Self-pay

## 2023-12-24 ENCOUNTER — Other Ambulatory Visit (HOSPITAL_COMMUNITY): Payer: Self-pay

## 2023-12-24 MED ORDER — ESOMEPRAZOLE MAGNESIUM 40 MG PO CPDR
40.0000 mg | DELAYED_RELEASE_CAPSULE | Freq: Every day | ORAL | 1 refills | Status: DC
  Filled 2023-12-24: qty 90, 90d supply, fill #0
  Filled 2024-03-25: qty 90, 90d supply, fill #1

## 2023-12-27 ENCOUNTER — Other Ambulatory Visit (HOSPITAL_COMMUNITY): Payer: Self-pay

## 2024-01-16 ENCOUNTER — Other Ambulatory Visit (HOSPITAL_COMMUNITY): Payer: Self-pay

## 2024-01-16 ENCOUNTER — Other Ambulatory Visit: Payer: Self-pay | Admitting: Family Medicine

## 2024-01-16 MED ORDER — KETOCONAZOLE 2 % EX CREA
TOPICAL_CREAM | Freq: Two times a day (BID) | CUTANEOUS | 3 refills | Status: AC
  Filled 2024-01-16: qty 60, 30d supply, fill #0
  Filled 2024-02-10: qty 60, 30d supply, fill #1
  Filled 2024-03-06: qty 60, 30d supply, fill #2
  Filled 2024-04-10: qty 60, 30d supply, fill #3

## 2024-01-27 ENCOUNTER — Other Ambulatory Visit (HOSPITAL_COMMUNITY): Payer: Self-pay

## 2024-02-03 ENCOUNTER — Other Ambulatory Visit: Payer: Self-pay

## 2024-02-03 ENCOUNTER — Other Ambulatory Visit: Payer: Self-pay | Admitting: Family Medicine

## 2024-02-03 ENCOUNTER — Other Ambulatory Visit (HOSPITAL_COMMUNITY): Payer: Self-pay

## 2024-02-03 MED ORDER — MELOXICAM 15 MG PO TABS
15.0000 mg | ORAL_TABLET | Freq: Every day | ORAL | 1 refills | Status: DC
  Filled 2024-02-03: qty 90, 90d supply, fill #0
  Filled 2024-04-29: qty 90, 90d supply, fill #1

## 2024-02-18 ENCOUNTER — Other Ambulatory Visit: Payer: Self-pay | Admitting: Family Medicine

## 2024-02-18 ENCOUNTER — Other Ambulatory Visit (HOSPITAL_COMMUNITY): Payer: Self-pay

## 2024-02-18 MED ORDER — ROSUVASTATIN CALCIUM 20 MG PO TABS
20.0000 mg | ORAL_TABLET | Freq: Every evening | ORAL | 2 refills | Status: AC
  Filled 2024-02-18: qty 90, 90d supply, fill #0
  Filled 2024-02-28 – 2024-05-15 (×2): qty 90, 90d supply, fill #1
  Filled 2024-08-17: qty 90, 90d supply, fill #2

## 2024-02-28 ENCOUNTER — Other Ambulatory Visit (HOSPITAL_COMMUNITY): Payer: Self-pay

## 2024-03-29 ENCOUNTER — Ambulatory Visit
Admission: EM | Admit: 2024-03-29 | Discharge: 2024-03-29 | Disposition: A | Discharge status: Home / Self Care | Attending: Emergency Medicine | Admitting: Emergency Medicine

## 2024-03-29 ENCOUNTER — Encounter: Payer: Self-pay | Admitting: *Deleted

## 2024-03-29 ENCOUNTER — Other Ambulatory Visit: Payer: Self-pay

## 2024-03-29 DIAGNOSIS — S0993XA Unspecified injury of face, initial encounter: Secondary | ICD-10-CM

## 2024-03-29 DIAGNOSIS — T148XXA Other injury of unspecified body region, initial encounter: Secondary | ICD-10-CM | POA: Diagnosis not present

## 2024-03-29 NOTE — Discharge Instructions (Addendum)
 Please monitor for any changes in symptoms If you develop any eye pain, including pain with moving your eyes around, any visual changes including loss of vision, and bleeding from the eyeball itself, or any pain of the bones around the eye (orbits), please be seen immediately in the emergency department.

## 2024-03-29 NOTE — ED Triage Notes (Signed)
 Pt states he fell and his right eye on the arm of a rocking chair. Denies pain but states his eye has been draining since then. No meds taken

## 2024-03-29 NOTE — ED Provider Notes (Signed)
 [facility]    CSN: [id number] Arrival date & time: 03/29/24  1049      History   Chief Complaint Chief Complaint  Patient presents with   Eye Drainage    HPI Charles [name] is a 68 y.o. male.  Yesterday morning he hit his right upper eyelid/eye on the arm of the rocking chair. A little pain at first but it quickly resolved. He did not lose consciousness. No headaches, dizziness. He's not having any eye or eyelid pain today, just wanted to be evaluated. Not having any vision changes.   He has clear drainage from the eye but reports this is baseline for him  Past Medical History:  Diagnosis Date   Acute medial meniscus tear of left knee 12/24/2019   Allergy    seasonal   Arthritis    CLL (chronic lymphoblastic leukemia) 08/03/2011   CLL (chronic lymphocytic leukemia) (HCC) 03/18/2012   GERD (gastroesophageal reflux disease)    Hyperlipidemia    Hypertension    Shingles    Ulcer     Patient Active Problem List   Diagnosis Date Noted   Hyperlipidemia 11/14/2022   Seasonal allergies 11/14/2022   Carpal tunnel syndrome, left upper limb 06/29/2022   Paresthesia 06/29/2022   Spondylolisthesis, lumbar region 06/05/2022   Spinal stenosis of lumbar region 05/14/2022   Erectile dysfunction 11/26/2020   Right-sided Bell's palsy 11/26/2020   Tubular adenoma of colon 08/05/2020   Elevated LFTs 05/23/2020   Skin cancer 08/31/2019   Hearing loss 02/19/2019   History of shingles 02/19/2019   Family history of prostate cancer 08/28/2018   Poor dentition 08/28/2018   Hypertension 08/02/2016   Pre-diabetes 07/14/2015   Severe obesity (BMI 35.0-39.9) with comorbidity (HCC) 07/14/2015   GERD (gastroesophageal reflux disease) 12/25/2012   CLL (chronic lymphocytic leukemia) (HCC) 03/18/2012    Past Surgical History:  Procedure Laterality Date   CARPAL TUNNEL RELEASE Left 10/17/2023   Procedure: LEFT CARPAL TUNNEL RELEASE;  Surgeon: Vernetta Lonni GRADE, MD;   Location: [facility];  Service: Orthopedics;  Laterality: Left;   HAND SURGERY  1985   HERNIA REPAIR  1990   KNEE ARTHROSCOPY WITH LATERAL MENISECTOMY Left 12/24/2019   Procedure: KNEE ARTHROSCOPY WITH PARTIAL LATERAL MENISECTOMY;  Surgeon: Vernetta Lonni GRADE, MD;  Location: [facility];  Service: Orthopedics;  Laterality: Left;   KNEE SURGERY Right 2005   SHOULDER SURGERY Left 2007   TRANSFORAMINAL LUMBAR INTERBODY FUSION W/ MIS 2 LEVEL Right 06/05/2022   Procedure: MINIMALLY INVASIVE DECOMPRESSION ,TRANSFORAMINAL LUMBAR INTERBODY FUSION, RIGHT SIDED APPROACH LUMBAR THRE-FOUR, LUMBAR FOUR-FIVE;  Surgeon: Dawley, Lani JAYSON, DO;  Location: [facility];  Service: Neurosurgery;  Laterality: Right;   WRIST SURGERY Right        Home Medications    Prior to Admission medications   Medication Sig Start Date End Date Taking? Authorizing Provider  aspirin  EC (ASPIRIN  LOW DOSE) 81 MG tablet Take 1 tablet (81 mg total) by mouth daily. 06/12/22  Yes Dawley, Troy C, DO  cetirizine (ZYRTEC) 10 MG tablet Take 10 mg by mouth daily.   Yes [provider]  Cholecalciferol (VITAMIN D3) 2000 UNITS TABS Take 2,000 Units by mouth.   Yes [provider]  esomeprazole  (NEXIUM ) 40 MG capsule Take 1 capsule (40 mg total) by mouth daily. 12/24/23  Yes Almarie Waddell NOVAK, NP  fluticasone  (FLONASE ) 50 MCG/ACT nasal spray Place 1 spray into both nostrils daily. 12/24/22  Yes   ketoconazole  (NIZORAL ) 2 %  cream Apply topically 2 (two) times daily. 01/16/24  Yes Almarie Waddell NOVAK, NP  lisinopril -hydrochlorothiazide  (ZESTORETIC ) 20-25 MG tablet Take 1 tablet by mouth daily. 11/13/23  Yes Almarie Waddell NOVAK, NP  meloxicam  (MOBIC ) 15 MG tablet Take 1 tablet (15 mg total) by mouth daily. 02/03/24  Yes Almarie Waddell NOVAK, NP  rosuvastatin  (CRESTOR ) 20 MG tablet Take 1 tablet (20 mg total) by mouth at bedtime. 02/18/24  Yes Almarie Waddell NOVAK, NP  acetaminophen  (TYLENOL ) 500 MG tablet Take 1,000-1,500 mg by  mouth daily.    [provider]  HYDROcodone -acetaminophen  (NORCO/VICODIN) 5-325 MG tablet Take 1-2 tablets by mouth every 6 (six) hours as needed for moderate pain (pain score 4-6). Patient not taking: Reported on 03/29/2024 10/17/23   Vernetta Lonni GRADE, MD  magic mouthwash (lidocaine , diphenhydrAMINE , alum & mag hydroxide) suspension Swish and spit 10 mls by mouth every morning and every evening. Patient not taking: Reported on 03/29/2024 12/11/23     methocarbamol  (ROBAXIN ) 750 MG tablet Take 1 tablet (750 mg total) by mouth 3 (three) times daily as needed for muscle spasms Patient not taking: Reported on 03/29/2024 11/05/22       Family History Family History  Problem Relation Age of Onset   Diabetes Mother    Hyperlipidemia Father    Hearing loss Father    Cancer Sister    Breast cancer Sister    Melanoma Sister    Cancer Brother 53       prostate cancer stage 4   Cancer Brother 75       prostate   Cancer Brother    Cancer - Colon Brother    Colon cancer Brother    Neuropathy Neg Hx     Social History Social History   Tobacco Use   Smoking status: Former    Current packs/day: 0.00    Average packs/day: 2.0 packs/day for 20.0 years (40.0 ttl pk-yrs)    Types: Cigarettes    Start date: 08/24/1978    Quit date: 08/24/1998    Years since quitting: 25.6   Smokeless tobacco: Never   Tobacco comments:    quit smoking 15 years ago  Vaping Use   Vaping status: Never Used  Substance Use Topics   Alcohol use: Yes    Comment: 1-2 beers a month   Drug use: No     Allergies   Patient has no known allergies.   Review of Systems Review of Systems As per HPI  Physical Exam Triage Vital Signs ED Triage Vitals  Encounter Vitals Group     BP 03/29/24 1115 134/79     Girls Systolic BP Percentile --      Girls Diastolic BP Percentile --      Boys Systolic BP Percentile --      Boys Diastolic BP Percentile --      Pulse Rate 03/29/24 1115 (!) 54     Resp 03/29/24  1115 18     Temp 03/29/24 1115 97.8 F (36.6 C)     Temp Source 03/29/24 1115 Oral     SpO2 03/29/24 1115 97 %     Weight --      Height --      Head Circumference --      Peak Flow --      Pain Score 03/29/24 1112 0     Pain Loc --      Pain Education --      Exclude from Growth Chart --  No data found.  Updated Vital Signs BP 134/79 (BP Location: Left Arm)   Pulse (!) 54   Temp 97.8 F (36.6 C) (Oral)   Resp 18   SpO2 97%   Visual Acuity Right Eye Distance: 20/25 Left Eye Distance: 20/40 Bilateral Distance: 20/25 (CORRECTED WITH GLASSES)  Right Eye Near:   Left Eye Near:    Bilateral Near:     Physical Exam Vitals and nursing note reviewed.  Constitutional:      General: He is not in acute distress.    Appearance: Normal appearance.  HENT:     Mouth/Throat:     Mouth: Mucous membranes are moist.     Pharynx: Oropharynx is clear.  Eyes:     General: Vision grossly intact. Gaze aligned appropriately.     Extraocular Movements: Extraocular movements intact.     Right eye: Normal extraocular motion.     Conjunctiva/sclera: Conjunctivae normal.     Right eye: Right conjunctiva is not injected. No chemosis, exudate or hemorrhage.    Pupils: Pupils are equal, round, and reactive to light.      Comments: Area of pink/purple bruising medial right eye. There is no bleeding, laceration, abrasion. No swelling at this time. The eyelid is non tender to palpation. No orbital bone tenderness. EOM intact without pain. The conjunctiva is clear. Thre is no hyphema. Pupils equal and reactive. No photophobia   Cardiovascular:     Rate and Rhythm: Normal rate and regular rhythm.     Pulses: Normal pulses.     Heart sounds: Normal heart sounds.  Pulmonary:     Effort: Pulmonary effort is normal.     Breath sounds: Normal breath sounds.  Musculoskeletal:     Cervical back: Normal range of motion.  Skin:    General: Skin is warm and dry.  Neurological:     Mental Status: He  is alert and oriented to person, place, and time.     UC Treatments / Results  Labs (all labs ordered are listed, but only abnormal results are displayed) Labs Reviewed - No data to display  EKG   Radiology No results found.  Procedures Procedures (including critical care time)  Medications Ordered in UC Medications - No data to display  Initial Impression / Assessment and Plan / UC Course  I have reviewed the triage vital signs and the nursing notes.  Pertinent labs & imaging results that were available during my care of the patient were reviewed by me and considered in my medical decision making (see chart for details).  Reassuring no eye pain, no eye pain with movement, no vision changes, no headaches or dizziness, no hyphema or other corneal abnormality. He is feeling well and just wanted to be evaluated today. Visual acuity intact - his injured eye is actually better in acuity than the other eye. We have discussed supportive care, and that he may develop further bruising. Monitor symptoms closely.  Strict ED precautions are verbalized Patient verbalizes understanding, no questions   Final Clinical Impressions(s) / UC Diagnoses   Final diagnoses:  Injury of right eyelid, initial encounter  Bruising     Discharge Instructions      Please monitor for any changes in symptoms If you develop any eye pain, including pain with moving your eyes around, any visual changes including loss of vision, and bleeding from the eyeball itself, or any pain of the bones around the eye (orbits), please be seen immediately in the emergency department.  ED Prescriptions   None    PDMP not reviewed this encounter.   Jeryl Stabs, [place] 03/29/24 1156

## 2024-04-02 ENCOUNTER — Ambulatory Visit (INDEPENDENT_AMBULATORY_CARE_PROVIDER_SITE_OTHER): Admitting: Podiatry

## 2024-04-02 ENCOUNTER — Encounter: Payer: Self-pay | Admitting: Podiatry

## 2024-04-02 ENCOUNTER — Encounter: Payer: Self-pay | Admitting: Orthopaedic Surgery

## 2024-04-02 ENCOUNTER — Ambulatory Visit: Admitting: Orthopaedic Surgery

## 2024-04-02 ENCOUNTER — Ambulatory Visit: Admitting: Podiatry

## 2024-04-02 DIAGNOSIS — G5601 Carpal tunnel syndrome, right upper limb: Secondary | ICD-10-CM | POA: Diagnosis not present

## 2024-04-02 DIAGNOSIS — Z9889 Other specified postprocedural states: Secondary | ICD-10-CM

## 2024-04-02 DIAGNOSIS — G5602 Carpal tunnel syndrome, left upper limb: Secondary | ICD-10-CM | POA: Diagnosis not present

## 2024-04-02 DIAGNOSIS — L603 Nail dystrophy: Secondary | ICD-10-CM

## 2024-04-02 DIAGNOSIS — B351 Tinea unguium: Secondary | ICD-10-CM | POA: Diagnosis not present

## 2024-04-02 NOTE — Progress Notes (Signed)
 Subjective:  Patient ID: Charles [name], male    DOB: 04-20-1956,  MRN: [id number] HPI Chief Complaint  Patient presents with   Nail Problem    Hallux bilateral - thick, discolored x years, concern for fungus, advised to come and have checked by the lady that trims his toenails   New Patient (Initial Visit)    Est pt 2020    68 y.o. male presents with the above complaint.   ROS: Denies fever chills nausea mobic  muscle aches pains calf pain back pain chest pain shortness of breath.  Past Medical History:  Diagnosis Date   Acute medial meniscus tear of left knee 12/24/2019   Allergy    seasonal   Arthritis    CLL (chronic lymphoblastic leukemia) 08/03/2011   CLL (chronic lymphocytic leukemia) (HCC) 03/18/2012   GERD (gastroesophageal reflux disease)    Hyperlipidemia    Hypertension    Shingles    Ulcer    Past Surgical History:  Procedure Laterality Date   CARPAL TUNNEL RELEASE Left 10/17/2023   Procedure: LEFT CARPAL TUNNEL RELEASE;  Surgeon: Vernetta Lonni GRADE, MD;  Location: [facility];  Service: Orthopedics;  Laterality: Left;   HAND SURGERY  1985   HERNIA REPAIR  1990   KNEE ARTHROSCOPY WITH LATERAL MENISECTOMY Left 12/24/2019   Procedure: KNEE ARTHROSCOPY WITH PARTIAL LATERAL MENISECTOMY;  Surgeon: Vernetta Lonni GRADE, MD;  Location: [facility];  Service: Orthopedics;  Laterality: Left;   KNEE SURGERY Right 2005   SHOULDER SURGERY Left 2007   TRANSFORAMINAL LUMBAR INTERBODY FUSION W/ MIS 2 LEVEL Right 06/05/2022   Procedure: MINIMALLY INVASIVE DECOMPRESSION ,TRANSFORAMINAL LUMBAR INTERBODY FUSION, RIGHT SIDED APPROACH LUMBAR THRE-FOUR, LUMBAR FOUR-FIVE;  Surgeon: Dawley, Lani JAYSON, DO;  Location: [facility];  Service: Neurosurgery;  Laterality: Right;   WRIST SURGERY Right     Current Outpatient Medications:    acetaminophen  (TYLENOL ) 500 MG tablet, Take 1,000-1,500 mg by mouth daily., Disp: , Rfl:    aspirin  EC (ASPIRIN  LOW DOSE) 81  MG tablet, Take 1 tablet (81 mg total) by mouth daily., Disp: 30 tablet, Rfl: 0   cetirizine (ZYRTEC) 10 MG tablet, Take 10 mg by mouth daily., Disp: , Rfl:    Cholecalciferol (VITAMIN D3) 2000 UNITS TABS, Take 2,000 Units by mouth., Disp: , Rfl:    esomeprazole  (NEXIUM ) 40 MG capsule, Take 1 capsule (40 mg total) by mouth daily., Disp: 90 capsule, Rfl: 1   fluticasone  (FLONASE ) 50 MCG/ACT nasal spray, Place 1 spray into both nostrils daily., Disp: 48 g, Rfl: 3   HYDROcodone -acetaminophen  (NORCO/VICODIN) 5-325 MG tablet, Take 1-2 tablets by mouth every 6 (six) hours as needed for moderate pain (pain score 4-6). (Patient not taking: Reported on 03/29/2024), Disp: 20 tablet, Rfl: 0   ketoconazole  (NIZORAL ) 2 % cream, Apply topically 2 (two) times daily., Disp: 60 g, Rfl: 3   lisinopril -hydrochlorothiazide  (ZESTORETIC ) 20-25 MG tablet, Take 1 tablet by mouth daily., Disp: 90 tablet, Rfl: 1   magic mouthwash (lidocaine , diphenhydrAMINE , alum & mag hydroxide) suspension, Swish and spit 10 mls by mouth every morning and every evening. (Patient not taking: Reported on 03/29/2024), Disp: 120 mL, Rfl: 1   meloxicam  (MOBIC ) 15 MG tablet, Take 1 tablet (15 mg total) by mouth daily., Disp: 90 tablet, Rfl: 1   methocarbamol  (ROBAXIN ) 750 MG tablet, Take 1 tablet (750 mg total) by mouth 3 (three) times daily as needed for muscle spasms (Patient not taking: Reported on 03/29/2024), Disp: 90 tablet, Rfl: 2  rosuvastatin  (CRESTOR ) 20 MG tablet, Take 1 tablet (20 mg total) by mouth at bedtime., Disp: 90 tablet, Rfl: 2  No Known Allergies Review of Systems Objective:  There were no vitals filed for this visit.  General: Well developed, nourished, in no acute distress, alert and oriented x3   Dermatological: Skin is warm, dry and supple bilateral. Nails x 10 are well maintained; remaining integument appears unremarkable at this time. There are no open sores, no preulcerative lesions, no rash or signs of infection  present.  Hallux nails bilateral remaining pain and do demonstrate discoloration yellow subungual debris and tenderness.  There is some element  Vascular: Dorsalis Pedis artery and Posterior Tibial artery pedal pulses are 2/4 bilateral with immedate capillary fill time. Pedal hair growth present. No varicosities and no lower extremity edema present bilateral.   Neruologic: Grossly intact via light touch bilateral. Vibratory intact via tuning fork bilateral. Protective threshold with Semmes Wienstein monofilament intact to all pedal sites bilateral. Patellar and Achilles deep tendon reflexes 2+ bilateral. No Babinski or clonus noted bilateral.   Musculoskeletal: No gross boney pedal deformities bilateral. No pain, crepitus, or limitation noted with foot and ankle range of motion bilateral. Muscular strength 5/5 in all groups tested bilateral.  Gait: Unassisted, Nonantalgic.    Radiographs:  None taken  Assessment & Plan:   Assessment: Nail dystrophy cannot rule out onychomycosis hallux bilateral  Plan: Samples of skin and nail were taken today for pathologic evaluation follow-up with them in 1 month     Tokiko Diefenderfer T. [place], [place]

## 2024-04-02 NOTE — Progress Notes (Signed)
 The patient is now 6 months status post a left open carpal tunnel release.  His carpal tunnel via EMG studies was severe on both his left and right side.  He says he still has some numbness and tingling but has made a great improvement on the left side.  He wants to consider a right open carpal tunnel release toward the end of the year.  On exam his left side does have much better pinch and grip strength and has got good strength overall with some decrease sensation median nerve attribution to be expected given he is 68 years old and it was quite severe carpal tunnel syndrome.  His right side shows no atrophy on the hand but definite numbness in the median nerve distribution with positive Phalen's and Tinel's exam as well.  He does have our surgery scheduler's card.  When he gets close to where he wants to schedule for a right open carpal tunnel release, he will give us  a call with good heads up.  He is thinking about having this done in December.

## 2024-04-09 ENCOUNTER — Other Ambulatory Visit: Payer: Self-pay | Admitting: Podiatry

## 2024-04-10 ENCOUNTER — Other Ambulatory Visit (HOSPITAL_COMMUNITY): Payer: Self-pay

## 2024-04-10 ENCOUNTER — Other Ambulatory Visit: Payer: Self-pay

## 2024-04-10 ENCOUNTER — Encounter (HOSPITAL_COMMUNITY): Payer: Self-pay

## 2024-04-16 ENCOUNTER — Telehealth: Payer: Self-pay

## 2024-04-16 NOTE — Telephone Encounter (Signed)
 Patient wants to have CTR and shoulder surgery in early December.  Please provide surgery sheet with shoulder procedure.  Thanks!

## 2024-04-17 NOTE — Telephone Encounter (Signed)
I called patient and scheduled office visit.

## 2024-04-27 ENCOUNTER — Ambulatory Visit: Admitting: Orthopaedic Surgery

## 2024-04-27 ENCOUNTER — Other Ambulatory Visit (INDEPENDENT_AMBULATORY_CARE_PROVIDER_SITE_OTHER): Payer: Self-pay

## 2024-04-27 ENCOUNTER — Other Ambulatory Visit: Payer: Self-pay

## 2024-04-27 DIAGNOSIS — M25511 Pain in right shoulder: Secondary | ICD-10-CM

## 2024-04-27 DIAGNOSIS — G5601 Carpal tunnel syndrome, right upper limb: Secondary | ICD-10-CM

## 2024-04-27 DIAGNOSIS — G8929 Other chronic pain: Secondary | ICD-10-CM | POA: Diagnosis not present

## 2024-04-27 NOTE — Progress Notes (Signed)
 The patient is a 68 year old gentleman well-known to us .  We have most recently performed a left open carpal tunnel release.  There are still some numbness and tingling in the hand but that is to be expected given the severity of his carpal tunnel syndrome.  He does have known severe carpal tunnel syndrome on the right side.  He also has remote history of left shoulder rotator cuff repair done around 2010.  His right shoulder is been hurting him with overhead activities and reaching behind.  He felt something happened in that shoulder when he was reaching out for something recently.  It is still bothersome for him.  On examination his left hand is doing well.  There is some subjective numbness and tingling but overall he has got improved grip strength and pinch strength.  His right side does show signs of severe carpal tunnel syndrome but no muscle atrophy.  There is weaker pinch and grip strength and numbness in the median nerve distribution.  On examination of his right shoulder he does have full motion of the right shoulder and he does not seem like he is using his deltoid to abduct the shoulder.  His liftoff is significantly weak though.  His external rotation is full and he is able to reach behind himself easily and overhead but is painful past 90 degrees of forward flexion and abduction on the right side.  3 views of the right shoulder send no acute findings.  The humeral head is not high riding in a good position.  The subacromial outlet also appears adequate.  Of note the patient does report numbness and tingling in that arm and hand and we know he does have severe carpal tunnel syndrome.  The nerve conduction studies of his right upper extremity did show demyelinating on the right side but did not show any evidence of compression from the cervical spine.  At this point we will order a MRI of the right shoulder to rule out a rotator cuff tear and then we will see him back in follow-up after this MRI in  terms of surgical planning.  We know he does need a right open carpal tunnel release but he was considering having shoulder surgery on the right side and the right hand at the same time if warranted.

## 2024-05-04 DIAGNOSIS — D224 Melanocytic nevi of scalp and neck: Secondary | ICD-10-CM | POA: Diagnosis not present

## 2024-05-04 DIAGNOSIS — D225 Melanocytic nevi of trunk: Secondary | ICD-10-CM | POA: Diagnosis not present

## 2024-05-04 DIAGNOSIS — Z85820 Personal history of malignant melanoma of skin: Secondary | ICD-10-CM | POA: Diagnosis not present

## 2024-05-04 DIAGNOSIS — L918 Other hypertrophic disorders of the skin: Secondary | ICD-10-CM | POA: Diagnosis not present

## 2024-05-04 DIAGNOSIS — Z85828 Personal history of other malignant neoplasm of skin: Secondary | ICD-10-CM | POA: Diagnosis not present

## 2024-05-04 DIAGNOSIS — L720 Epidermal cyst: Secondary | ICD-10-CM | POA: Diagnosis not present

## 2024-05-04 DIAGNOSIS — D1801 Hemangioma of skin and subcutaneous tissue: Secondary | ICD-10-CM | POA: Diagnosis not present

## 2024-05-04 DIAGNOSIS — D2262 Melanocytic nevi of left upper limb, including shoulder: Secondary | ICD-10-CM | POA: Diagnosis not present

## 2024-05-04 DIAGNOSIS — D2261 Melanocytic nevi of right upper limb, including shoulder: Secondary | ICD-10-CM | POA: Diagnosis not present

## 2024-05-05 ENCOUNTER — Ambulatory Visit (INDEPENDENT_AMBULATORY_CARE_PROVIDER_SITE_OTHER): Admitting: Podiatry

## 2024-05-05 ENCOUNTER — Other Ambulatory Visit (HOSPITAL_COMMUNITY): Payer: Self-pay

## 2024-05-05 ENCOUNTER — Encounter: Payer: Self-pay | Admitting: Podiatry

## 2024-05-05 DIAGNOSIS — L603 Nail dystrophy: Secondary | ICD-10-CM | POA: Diagnosis not present

## 2024-05-05 MED ORDER — TERBINAFINE HCL 250 MG PO TABS
250.0000 mg | ORAL_TABLET | Freq: Every day | ORAL | 0 refills | Status: DC
  Filled 2024-05-05: qty 30, 30d supply, fill #0

## 2024-05-05 NOTE — Progress Notes (Signed)
 He presents today for follow-up of his nail pathology.  Objective: No change in physical exam nail pathology is positive for saprophytic fungus.  Assessment: Onychomycosis.  Plan: We discussed topical therapy oral therapy and laser therapy.  At this point he would like to try oral therapy.  We are going to start him on 30 days of Lamisil 250 mg tablets.  He will take 1 tablet daily and I will follow-up with him in 1 month for blood work.  Questions or concerns he will notify us  immediately we did discuss pros and cons and use of this medication he understands the possible side effects and complications associated with it.

## 2024-05-12 ENCOUNTER — Other Ambulatory Visit (HOSPITAL_COMMUNITY): Payer: Self-pay

## 2024-05-12 ENCOUNTER — Other Ambulatory Visit: Payer: Self-pay | Admitting: Family Medicine

## 2024-05-12 DIAGNOSIS — I10 Essential (primary) hypertension: Secondary | ICD-10-CM

## 2024-05-12 MED ORDER — LISINOPRIL-HYDROCHLOROTHIAZIDE 20-25 MG PO TABS
1.0000 | ORAL_TABLET | Freq: Every day | ORAL | 0 refills | Status: DC
  Filled 2024-05-12: qty 90, 90d supply, fill #0

## 2024-05-19 ENCOUNTER — Encounter: Payer: Self-pay | Admitting: Family Medicine

## 2024-05-19 ENCOUNTER — Other Ambulatory Visit (HOSPITAL_COMMUNITY): Payer: Self-pay

## 2024-05-19 ENCOUNTER — Ambulatory Visit: Admitting: Family Medicine

## 2024-05-19 ENCOUNTER — Other Ambulatory Visit (HOSPITAL_BASED_OUTPATIENT_CLINIC_OR_DEPARTMENT_OTHER): Payer: Self-pay

## 2024-05-19 VITALS — BP 121/56 | HR 56 | Ht 68.0 in | Wt 240.0 lb

## 2024-05-19 DIAGNOSIS — I10 Essential (primary) hypertension: Secondary | ICD-10-CM

## 2024-05-19 DIAGNOSIS — E785 Hyperlipidemia, unspecified: Secondary | ICD-10-CM | POA: Diagnosis not present

## 2024-05-19 DIAGNOSIS — R7303 Prediabetes: Secondary | ICD-10-CM | POA: Diagnosis not present

## 2024-05-19 DIAGNOSIS — Z8619 Personal history of other infectious and parasitic diseases: Secondary | ICD-10-CM

## 2024-05-19 DIAGNOSIS — K219 Gastro-esophageal reflux disease without esophagitis: Secondary | ICD-10-CM | POA: Diagnosis not present

## 2024-05-19 DIAGNOSIS — C9110 Chronic lymphocytic leukemia of B-cell type not having achieved remission: Secondary | ICD-10-CM | POA: Diagnosis not present

## 2024-05-19 LAB — COMPREHENSIVE METABOLIC PANEL WITH GFR
ALT: 14 U/L (ref 0–53)
AST: 19 U/L (ref 0–37)
Albumin: 4.6 g/dL (ref 3.5–5.2)
Alkaline Phosphatase: 74 U/L (ref 39–117)
BUN: 18 mg/dL (ref 6–23)
CO2: 31 meq/L (ref 19–32)
Calcium: 9.7 mg/dL (ref 8.4–10.5)
Chloride: 96 meq/L (ref 96–112)
Creatinine, Ser: 1.25 mg/dL (ref 0.40–1.50)
GFR: 58.73 mL/min — ABNORMAL LOW (ref >60.00)
Glucose, Bld: 107 mg/dL — ABNORMAL HIGH (ref 70–99)
Potassium: 4.1 meq/L (ref 3.5–5.1)
Sodium: 136 meq/L (ref 135–145)
Total Bilirubin: 0.8 mg/dL (ref 0.2–1.2)
Total Protein: 7 g/dL (ref 6.0–8.3)

## 2024-05-19 LAB — HEMOGLOBIN A1C: Hgb A1c MFr Bld: 6.5 % (ref 4.6–6.5)

## 2024-05-19 MED ORDER — VALACYCLOVIR HCL 1 G PO TABS
2000.0000 mg | ORAL_TABLET | Freq: Two times a day (BID) | ORAL | 1 refills | Status: DC
  Filled 2024-05-19: qty 10, 3d supply, fill #0
  Filled 2024-06-10: qty 10, 3d supply, fill #1

## 2024-05-19 NOTE — Assessment & Plan Note (Signed)
 Stable on Nexium.

## 2024-05-19 NOTE — Assessment & Plan Note (Signed)
 No acute symptoms. Following with Dr. Timmy

## 2024-05-19 NOTE — Progress Notes (Signed)
 Established Patient Office Visit  Subjective    Patient ID: Charles [name], male    DOB: 10-Sep-1955  Age: 68 y.o. MRN: [id number]  CC:  Chief Complaint  Patient presents with   Medical Management of Chronic Issues    HPI Charles [name] presents for chronic disease management. He has been doing well. His wife has been having some health issues so he has been working hard to take care of her.    Hypertension: - Medications: lisinopril -hctz 20-25 mg daily - Compliance: good - Checking BP at home: good usually - Denies any SOB, recurrent headaches, CP, vision changes, LE edema, dizziness, palpitations, or medication side effects. - Diet: general, usually one meal a day plus snacks - Exercise: walking 6-10k steps per day   Hyperlipidemia: - medications: Crestor  20 mg daily  - compliance: good - medication SEs: none The 10-year ASCVD risk score (Arnett DK, et al., 2019) is: 17%   Values used to calculate the score:     Age: 22 years     Clincally relevant sex: Male     Is Non-Hispanic African American: No     Diabetic: No     Tobacco smoker: No     Systolic Blood Pressure: 121 mmHg     Is BP treated: Yes     HDL Cholesterol: 34 mg/dL     Total Cholesterol: 131 mg/dL   Cancer: - CLL; follows with [facility] Dr. Timmy (annually) - Dx 2013, Chemo 2014, now in remission   Seasonal allergies: - usually Spring and Fall - Flonase , Zyrtec - well controlled    Prediabetes: - Tries to eat low-carb diet - Denies symptoms of hypoglycemia, polyuria, polydipsia, numbness extremities, foot ulcers/trauma, wounds that are not healing, medication side effects  Lab Results  Component Value Date   HGBA1C 6.4 11/19/2023    GERD: - management: Nexium  40 mg daily  - symptoms: no, very well controlled   Sees Dr. Arlen for dermatology. History of skin cancer (basal cell and melanoma).   Currently following with Dr. Verta (podiatry) for onychomycosis.  Requesting  refill of PRN Valtrex  for cold sores.      ROS All review of systems negative except what is listed in the HPI      Objective    BP (!) 121/56   Pulse (!) 56   Ht 5' 8 (1.727 m)   Wt 240 lb (108.9 kg)   SpO2 97%   BMI 36.49 kg/m   Physical Exam Vitals reviewed.  Constitutional:      General: He is not in acute distress.    Appearance: Normal appearance. He is obese. He is not ill-appearing.  Cardiovascular:     Rate and Rhythm: Normal rate and regular rhythm.     Pulses: Normal pulses.     Heart sounds: Normal heart sounds.  Pulmonary:     Effort: Pulmonary effort is normal.     Breath sounds: Normal breath sounds.  Skin:    General: Skin is warm and dry.  Neurological:     Mental Status: He is alert and oriented to person, place, and time.  Psychiatric:        Mood and Affect: Mood normal.        Behavior: Behavior normal.        Thought Content: Thought content normal.        Judgment: Judgment normal.         Assessment & Plan:   Problem List Items  Addressed This Visit       Active Problems   CLL (chronic lymphocytic leukemia) (HCC) (Chronic)   No acute symptoms. Following with Dr. Timmy      Relevant Medications   valACYclovir  (VALTREX ) 1000 MG tablet   Hypertension (Chronic)   Blood pressure is at goal for age and co-morbidities.   Recommendations: continue lisinopril -hctz 20-25 mg daily - BP goal <130/80 - monitor and log blood pressures at home - check around the same time each day in a relaxed setting - Limit salt to <2000 mg/day - Follow DASH eating plan (heart healthy diet) - limit alcohol to 2 standard drinks per day for men and 1 per day for women - avoid tobacco products - get at least 2 hours of regular aerobic exercise weekly Patient aware of signs/symptoms requiring further/urgent evaluation. Labs today        Relevant Orders   Comprehensive metabolic panel with GFR   GERD (gastroesophageal reflux disease)   Stable on  Nexium        Pre-diabetes   Lab Results  Component Value Date   HGBA1C 6.4 11/19/2023   Encouraged heart healthy, low-carb diet, regular exercise, weight management.      Hyperlipidemia   Medication management: Continue Crestor  20 mg daily Lifestyle factors for lowering cholesterol include: Diet therapy - heart-healthy diet rich in fruits, veggies, fiber-rich whole grains, lean meats, chicken, fish (at least twice a week), fat-free or 1% dairy products; foods low in saturated/trans fats, cholesterol, sodium, and sugar. Mediterranean diet has shown to be very heart healthy. Regular exercise - recommend at least 30 minutes a day, 5 times per week Weight management         Other Visit Diagnoses       Prediabetes    -  Primary   Relevant Orders   Hemoglobin A1c   Comprehensive metabolic panel with GFR     History of cold sores       Relevant Medications   valACYclovir  (VALTREX ) 1000 MG tablet         Return in about 6 months (around 11/17/2024) for chronic disease management.   Waddell KATHEE Mon, NP  I,Emily Lagle,acting as a scribe for Waddell KATHEE Mon, NP.,have documented all relevant documentation on the behalf of Waddell KATHEE Mon, NP,as directed by  Waddell KATHEE Mon, NP while in the presence of Waddell KATHEE Mon, NP.    I, Waddell KATHEE Mon, NP, have reviewed all documentation for this visit. The documentation on 05/19/2024 for the exam, diagnosis, procedures, and orders are all accurate and complete.

## 2024-05-19 NOTE — Assessment & Plan Note (Signed)
 Lab Results  Component Value Date   HGBA1C 6.4 11/19/2023   Encouraged heart healthy, low-carb diet, regular exercise, weight management.

## 2024-05-19 NOTE — Assessment & Plan Note (Signed)
Medication management: Continue Crestor 20 mg daily Lifestyle factors for lowering cholesterol include: Diet therapy - heart-healthy diet rich in fruits, veggies, fiber-rich whole grains, lean meats, chicken, fish (at least twice a week), fat-free or 1% dairy products; foods low in saturated/trans fats, cholesterol, sodium, and sugar. Mediterranean diet has shown to be very heart healthy. Regular exercise - recommend at least 30 minutes a day, 5 times per week Weight management

## 2024-05-19 NOTE — Assessment & Plan Note (Signed)
Blood pressure is at goal for age and co-morbidities.   Recommendations: continue lisinopril-hctz 20-25 mg daily - BP goal <130/80 - monitor and log blood pressures at home - check around the same time each day in a relaxed setting - Limit salt to <2000 mg/day - Follow DASH eating plan (heart healthy diet) - limit alcohol to 2 standard drinks per day for men and 1 per day for women - avoid tobacco products - get at least 2 hours of regular aerobic exercise weekly Patient aware of signs/symptoms requiring further/urgent evaluation. Labs today

## 2024-05-20 ENCOUNTER — Ambulatory Visit: Payer: Self-pay | Admitting: Family Medicine

## 2024-05-21 ENCOUNTER — Ambulatory Visit
Admission: RE | Admit: 2024-05-21 | Discharge: 2024-05-21 | Disposition: A | Discharge status: Home / Self Care | Source: Ambulatory Visit | Attending: Orthopaedic Surgery | Admitting: Orthopaedic Surgery

## 2024-05-21 DIAGNOSIS — G8929 Other chronic pain: Secondary | ICD-10-CM

## 2024-05-21 DIAGNOSIS — S43491A Other sprain of right shoulder joint, initial encounter: Secondary | ICD-10-CM | POA: Diagnosis not present

## 2024-05-25 ENCOUNTER — Encounter: Payer: Self-pay | Admitting: Radiology

## 2024-05-28 ENCOUNTER — Ambulatory Visit (INDEPENDENT_AMBULATORY_CARE_PROVIDER_SITE_OTHER): Admitting: Orthopaedic Surgery

## 2024-05-28 DIAGNOSIS — G8929 Other chronic pain: Secondary | ICD-10-CM | POA: Diagnosis not present

## 2024-05-28 DIAGNOSIS — M75111 Incomplete rotator cuff tear or rupture of right shoulder, not specified as traumatic: Secondary | ICD-10-CM | POA: Insufficient documentation

## 2024-05-28 DIAGNOSIS — M25511 Pain in right shoulder: Secondary | ICD-10-CM

## 2024-05-28 NOTE — Progress Notes (Signed)
 Charles [name] comes in today to go over MRI of his right shoulder.  He has a significant pain and weakness in the shoulder for some time now.  He has tried and failed all forms conservative treatment.  On exam he is moving his shoulder well and he says it feels okay.  He let me know that his wife is intubated in the intensive care unit in the heart unit of at [facility] due to some cardiac issues and a valve problem.  He says it is a good chance she will not make it through this.  He is quite tearful as to be expected.  He would like to hold off on any type of surgical intervention and I agree with this as well.  The MRI of his right shoulder does show significant tendinosis of the supraspinatus tendon with partial near full-thickness rotator cuff tearing with no muscle atrophy.  There is no retraction.   there is mild arthritic changes of the glenohumeral joint and the AC joint.  Some point he may end up with an arthroscopic intervention for his right shoulder but right now his wife's health is more important.  He will contact us  at some point and let us  know how things are going.

## 2024-05-31 ENCOUNTER — Other Ambulatory Visit: Payer: Self-pay | Admitting: Podiatry

## 2024-06-03 ENCOUNTER — Encounter (HOSPITAL_COMMUNITY): Payer: Self-pay

## 2024-06-03 ENCOUNTER — Other Ambulatory Visit (HOSPITAL_COMMUNITY): Payer: Self-pay

## 2024-06-11 ENCOUNTER — Other Ambulatory Visit (HOSPITAL_COMMUNITY): Payer: Self-pay

## 2024-06-11 ENCOUNTER — Encounter: Payer: Self-pay | Admitting: Podiatry

## 2024-06-11 ENCOUNTER — Ambulatory Visit (INDEPENDENT_AMBULATORY_CARE_PROVIDER_SITE_OTHER): Admitting: Podiatry

## 2024-06-11 DIAGNOSIS — Z79899 Other long term (current) drug therapy: Secondary | ICD-10-CM | POA: Diagnosis not present

## 2024-06-11 DIAGNOSIS — L603 Nail dystrophy: Secondary | ICD-10-CM

## 2024-06-11 LAB — COMPREHENSIVE METABOLIC PANEL WITH GFR
AG Ratio: 2.2 (calc) (ref 1.0–2.5)
ALT: 15 U/L (ref 9–46)
AST: 19 U/L (ref 10–35)
Albumin: 4.6 g/dL (ref 3.6–5.1)
Alkaline phosphatase (APISO): 70 U/L (ref 35–144)
BUN: 17 mg/dL (ref 7–25)
CO2: 27 mmol/L (ref 20–32)
Calcium: 9.6 mg/dL (ref 8.6–10.3)
Chloride: 97 mmol/L — ABNORMAL LOW (ref 98–110)
Creat: 1.14 mg/dL (ref 0.70–1.35)
Globulin: 2.1 g/dL (ref 1.9–3.7)
Glucose, Bld: 93 mg/dL (ref 65–139)
Potassium: 4.1 mmol/L (ref 3.5–5.3)
Sodium: 134 mmol/L — ABNORMAL LOW (ref 135–146)
Total Bilirubin: 0.5 mg/dL (ref 0.2–1.2)
Total Protein: 6.7 g/dL (ref 6.1–8.1)
eGFR: 70 mL/min/1.73m2 (ref >=60)

## 2024-06-11 MED ORDER — TERBINAFINE HCL 250 MG PO TABS
250.0000 mg | ORAL_TABLET | Freq: Every day | ORAL | 0 refills | Status: AC
Start: 2024-06-11 — End: ?
  Filled 2024-06-11: qty 90, 90d supply, fill #0

## 2024-06-11 NOTE — Progress Notes (Signed)
 He presents today for follow-up of his nail fungus states that he seems to be doing better he has completed his first 30 days of Lamisil with absolutely no problems whatsoever.  Objective: Vitals are stable oriented x 3 no change in physical exam.  Assessment long-term therapy with onychomycosis and Lamisil.  Plan: Refilled his Lamisil 90 tablets today he will take 1 tablet daily for the next 90 days and we requested a comprehensive metabolic panel.

## 2024-06-16 ENCOUNTER — Ambulatory Visit: Payer: Self-pay | Admitting: Podiatry

## 2024-06-22 ENCOUNTER — Other Ambulatory Visit (HOSPITAL_COMMUNITY): Payer: Self-pay

## 2024-06-22 ENCOUNTER — Other Ambulatory Visit: Payer: Self-pay | Admitting: Family Medicine

## 2024-06-22 DIAGNOSIS — Z8619 Personal history of other infectious and parasitic diseases: Secondary | ICD-10-CM

## 2024-06-22 MED ORDER — ESOMEPRAZOLE MAGNESIUM 40 MG PO CPDR
40.0000 mg | DELAYED_RELEASE_CAPSULE | Freq: Every day | ORAL | 1 refills | Status: AC
  Filled 2024-06-22: qty 90, 90d supply, fill #0

## 2024-06-22 MED ORDER — VALACYCLOVIR HCL 1 G PO TABS
2000.0000 mg | ORAL_TABLET | Freq: Two times a day (BID) | ORAL | 1 refills | Status: DC
  Filled 2024-06-22: qty 10, 3d supply, fill #0

## 2024-06-24 ENCOUNTER — Encounter: Payer: Self-pay | Admitting: Family Medicine

## 2024-06-24 DIAGNOSIS — Z8619 Personal history of other infectious and parasitic diseases: Secondary | ICD-10-CM

## 2024-06-25 ENCOUNTER — Other Ambulatory Visit: Payer: Self-pay | Admitting: Family Medicine

## 2024-06-25 ENCOUNTER — Other Ambulatory Visit (HOSPITAL_COMMUNITY): Payer: Self-pay

## 2024-06-25 DIAGNOSIS — Z8619 Personal history of other infectious and parasitic diseases: Secondary | ICD-10-CM

## 2024-06-25 MED ORDER — VALACYCLOVIR HCL 1 G PO TABS
1000.0000 mg | ORAL_TABLET | Freq: Every day | ORAL | 3 refills | Status: AC
  Filled 2024-06-25: qty 30, 30d supply, fill #0
  Filled 2024-08-17: qty 30, 30d supply, fill #1

## 2024-06-25 MED ORDER — MAGIC MOUTHWASH W/LIDOCAINE
5.0000 mL | Freq: Four times a day (QID) | ORAL | 0 refills | Status: DC | PRN
  Filled 2024-06-25: qty 240, 12d supply, fill #0

## 2024-06-25 MED ORDER — NYSTATIN 100000 UNIT/ML MT SUSP
OROMUCOSAL | 0 refills | Status: DC
  Filled 2024-06-25: qty 240, 12d supply, fill #0

## 2024-06-25 MED ORDER — MAGIC MOUTHWASH W/LIDOCAINE: ORAL | 0 refills | Status: DC

## 2024-06-29 ENCOUNTER — Other Ambulatory Visit (INDEPENDENT_AMBULATORY_CARE_PROVIDER_SITE_OTHER)

## 2024-06-29 DIAGNOSIS — Z8619 Personal history of other infectious and parasitic diseases: Secondary | ICD-10-CM | POA: Diagnosis not present

## 2024-06-30 LAB — HERPES SIMPLEX VIRUS 1 AND 2 (IGG),REFLEX HSV-2 INHIBITION
HSV 1 IGG,TYPE SPECIFIC AB: <0.9 {index}
HSV 2 IGG,TYPE SPECIFIC AB: <0.9 {index}

## 2024-07-01 ENCOUNTER — Ambulatory Visit (HOSPITAL_COMMUNITY)
Admission: RE | Admit: 2024-07-01 | Discharge: 2024-07-01 | Disposition: A | Discharge status: Home / Self Care | Source: Ambulatory Visit | Attending: Family Medicine | Admitting: Family Medicine

## 2024-07-01 ENCOUNTER — Other Ambulatory Visit (HOSPITAL_COMMUNITY): Payer: Self-pay | Admitting: Family Medicine

## 2024-07-01 ENCOUNTER — Ambulatory Visit: Payer: Self-pay | Admitting: Family Medicine

## 2024-07-01 DIAGNOSIS — Z026 Encounter for examination for insurance purposes: Secondary | ICD-10-CM

## 2024-07-23 ENCOUNTER — Emergency Department (HOSPITAL_COMMUNITY)
Admission: EM | Admit: 2024-07-23 | Discharge: 2024-07-24 | Disposition: A | Discharge status: Home / Self Care | Source: Home / Self Care

## 2024-07-23 ENCOUNTER — Encounter (HOSPITAL_COMMUNITY): Payer: Self-pay

## 2024-07-23 ENCOUNTER — Emergency Department (HOSPITAL_COMMUNITY)

## 2024-07-23 DIAGNOSIS — I10 Essential (primary) hypertension: Secondary | ICD-10-CM | POA: Diagnosis not present

## 2024-07-23 DIAGNOSIS — Z87891 Personal history of nicotine dependence: Secondary | ICD-10-CM | POA: Insufficient documentation

## 2024-07-23 DIAGNOSIS — W228XXA Striking against or struck by other objects, initial encounter: Secondary | ICD-10-CM | POA: Diagnosis not present

## 2024-07-23 DIAGNOSIS — D72829 Elevated white blood cell count, unspecified: Secondary | ICD-10-CM | POA: Diagnosis not present

## 2024-07-23 DIAGNOSIS — Y92239 Unspecified place in hospital as the place of occurrence of the external cause: Secondary | ICD-10-CM | POA: Insufficient documentation

## 2024-07-23 DIAGNOSIS — Z79899 Other long term (current) drug therapy: Secondary | ICD-10-CM | POA: Diagnosis not present

## 2024-07-23 DIAGNOSIS — Y9389 Activity, other specified: Secondary | ICD-10-CM | POA: Insufficient documentation

## 2024-07-23 DIAGNOSIS — Z7982 Long term (current) use of aspirin: Secondary | ICD-10-CM | POA: Insufficient documentation

## 2024-07-23 DIAGNOSIS — S0990XA Unspecified injury of head, initial encounter: Secondary | ICD-10-CM | POA: Diagnosis present

## 2024-07-23 DIAGNOSIS — S0101XA Laceration without foreign body of scalp, initial encounter: Secondary | ICD-10-CM | POA: Diagnosis not present

## 2024-07-23 LAB — CBC WITH DIFFERENTIAL/PLATELET
Basophils Absolute: 0.3 K/uL — ABNORMAL HIGH (ref 0.0–0.1)
Basophils Relative: 2 %
Eosinophils Absolute: 0.8 K/uL — ABNORMAL HIGH (ref 0.0–0.5)
Eosinophils Relative: 5 %
HCT: 48.9 % (ref 39.0–52.0)
Hemoglobin: 16.7 g/dL (ref 13.0–17.0)
Lymphocytes Relative: 23 %
Lymphs Abs: 3.6 K/uL (ref 0.7–4.0)
MCH: 32.6 pg (ref 26.0–34.0)
MCHC: 34.2 g/dL (ref 30.0–36.0)
MCV: 95.5 fL (ref 80.0–100.0)
Monocytes Absolute: 0.8 K/uL (ref 0.1–1.0)
Monocytes Relative: 5 %
Neutro Abs: 10.3 K/uL — ABNORMAL HIGH (ref 1.7–7.7)
Neutrophils Relative %: 65 %
Platelets: 225 K/uL (ref 150–400)
RBC: 5.12 MIL/uL (ref 4.22–5.81)
RDW: 14.1 % (ref 11.5–15.5)
WBC: 15.8 K/uL — ABNORMAL HIGH (ref 4.0–10.5)
nRBC: 0 % (ref 0.0–0.2)

## 2024-07-23 LAB — BASIC METABOLIC PANEL WITH GFR
Anion gap: 14 (ref 5–15)
BUN: 17 mg/dL (ref 8–23)
CO2: 27 mmol/L (ref 22–32)
Calcium: 9.8 mg/dL (ref 8.9–10.3)
Chloride: 98 mmol/L (ref 98–111)
Creatinine, Ser: 1.27 mg/dL — ABNORMAL HIGH (ref 0.61–1.24)
GFR, Estimated: >60 mL/min
Glucose, Bld: 166 mg/dL — ABNORMAL HIGH (ref 70–99)
Potassium: 4.1 mmol/L (ref 3.5–5.1)
Sodium: 138 mmol/L (ref 135–145)

## 2024-07-23 LAB — CBG MONITORING, ED: Glucose-Capillary: 165 mg/dL — ABNORMAL HIGH (ref 70–99)

## 2024-07-23 MED ORDER — TETANUS-DIPHTH-ACELL PERTUSSIS 5-2-15.5 LF-MCG/0.5 IM SUSP: 0.5000 mL | Freq: Once | INTRAMUSCULAR | Status: DC

## 2024-07-23 NOTE — ED Triage Notes (Signed)
 Pt was working here at [employer] stop in the [employer], was working in tight space and stood up and hit his head on pipe. Denies LOC. Bleeding controlled, laceration noted to top left side of his head.

## 2024-07-23 NOTE — ED Provider Triage Note (Signed)
 Emergency Medicine Provider Triage Evaluation Note  RANVEER WAHLSTROM , a 69 y.o. male  was evaluated in triage.  Pt is an employee at [employer] and was on the third floor working at [employer] stop in the [employer] in a tight space and he stood up prior to clearing the metal pipe and hit his head.  He does have an approximately 5 to 6 cm laceration noted on the top of his head.  Patient denies any LOC.  He denies any blood thinners. Review of Systems  Positive: Laceration, head trauma Negative:   Physical Exam  BP (!) 141/84 (BP Location: Right Arm)   Pulse (!) 106   Temp 98.1 F (36.7 C) (Oral)   Resp 20   SpO2 97%  Gen:   Awake, no distress   Resp:  Normal effort  MSK:   Moves extremities without difficulty  Other:  Approximately 5 to 6 cm laceration on the top of patient's head.  Wound is hemostatic.  Medical Decision Making  Medically screening exam initiated at 8:50 PM.  Appropriate orders placed.  Lynwood JAYSON Born was informed that the remainder of the evaluation will be completed by another provider, this initial triage assessment does not replace that evaluation, and the importance of remaining in the ED until their evaluation is complete.  CT head, tetanus shot ordered.   Torrence Marry RAMAN, [place] 07/23/24 2051

## 2024-07-24 NOTE — ED Provider Notes (Signed)
 " [place] [facility] AT [facility] Provider Note   CSN: [id number] Arrival date & time: 07/23/24  2040     Patient presents with: Head Laceration   Charles [name] is a 69 y.o. male.  Patient presents to the emergency department complaining of a laceration to the top of his scalp.  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  Patient denies loss of consciousness.  Patient states that he has a mild headache.  Bleeding was controlled at the time of my assessment.    Head Laceration       Prior to Admission medications  Medication Sig Start Date End Date Taking? Authorizing Provider  aspirin  EC (ASPIRIN  LOW DOSE) 81 MG tablet Take 1 tablet (81 mg total) by mouth daily. 06/12/22   Dawley, Troy C, DO  cetirizine (ZYRTEC) 10 MG tablet Take 10 mg by mouth daily.    [provider]  Cholecalciferol (VITAMIN D3) 2000 UNITS TABS Take 2,000 Units by mouth.    [provider]  esomeprazole  (NEXIUM ) 40 MG capsule Take 1 capsule (40 mg total) by mouth daily. 06/22/24   Almarie Waddell NOVAK, NP  fluticasone  (FLONASE ) 50 MCG/ACT nasal spray Place 1 spray into both nostrils daily. 12/24/22     ketoconazole  (NIZORAL ) 2 % cream Apply topically 2 (two) times daily. 01/16/24   Almarie Waddell NOVAK, NP  lisinopril -hydrochlorothiazide  (ZESTORETIC ) 20-25 MG tablet Take 1 tablet by mouth daily. 05/12/24   Beck, Taylor B, NP  magic mouthwash (nystatin , lidocaine , diphenhydrAMINE ) suspension Swish and spit 5 mLs by mouth 4 (four) times daily as needed for mouth pain. 06/25/24   Almarie Waddell NOVAK, NP  meloxicam  (MOBIC ) 15 MG tablet Take 1 tablet (15 mg total) by mouth daily. 02/03/24   Almarie Waddell NOVAK, NP  rosuvastatin  (CRESTOR ) 20 MG tablet Take 1 tablet (20 mg total) by mouth at bedtime. 02/18/24   Almarie Waddell NOVAK, NP  terbinafine  (LAMISIL ) 250 MG tablet Take 1 tablet (250 mg total) by mouth daily. 06/11/24   Hyatt, Max T, DPM   valACYclovir  (VALTREX ) 1000 MG tablet Take 1 tablet (1,000 mg total) by mouth daily. 06/25/24   Almarie Waddell NOVAK, NP    Allergies: Patient has no known allergies.    Review of Systems  Updated Vital Signs BP (!) 156/84 (BP Location: Right Arm)   Pulse 73   Temp 97.8 F (36.6 C) (Oral)   Resp 16   SpO2 98%   Physical Exam Vitals and nursing note reviewed.  HENT:     Head: Normocephalic and atraumatic.     Comments: Laceration on scalp and occipital region, 7 cm in length Eyes:     Pupils: Pupils are equal, round, and reactive to light.  Pulmonary:     Effort: Pulmonary effort is normal. No respiratory distress.  Musculoskeletal:        General: No signs of injury.     Cervical back: Normal range of motion.  Skin:    General: Skin is dry.  Neurological:     Mental Status: He is alert.  Psychiatric:        Speech: Speech normal.        Behavior: Behavior normal.     (all labs ordered are listed, but only abnormal results are displayed) Labs Reviewed  CBC WITH DIFFERENTIAL/PLATELET - Abnormal; Notable for the following components:      Result Value   WBC 15.8 (*)  Neutro Abs 10.3 (*)    Eosinophils Absolute 0.8 (*)    Basophils Absolute 0.3 (*)    All other components within normal limits  BASIC METABOLIC PANEL WITH GFR - Abnormal; Notable for the following components:   Glucose, Bld 166 (*)    Creatinine, Ser 1.27 (*)    All other components within normal limits  CBG MONITORING, ED - Abnormal; Notable for the following components:   Glucose-Capillary 165 (*)    All other components within normal limits    EKG: EKG Interpretation Date/Time:  Thursday July 23 2024 21:07:05 EST Ventricular Rate:  96 PR Interval:  198 QRS Duration:  96 QT Interval:  368 QTC Calculation: 464 R Axis:   109  Text Interpretation: Normal sinus rhythm Rightward axis Incomplete right bundle branch block Cannot rule out Anterior infarct , age undetermined Abnormal ECG When  compared with ECG of 11-Oct-2023 12:44, PREVIOUS ECG IS PRESENT Confirmed by Lorette Mayo [phone]) on 07/24/2024 2:04:38 AM  Radiology: CT Head Wo Contrast Result Date: 07/23/2024 EXAM: CT HEAD WITHOUT CONTRAST 07/23/2024 09:33:22 PM TECHNIQUE: CT of the head was performed without the administration of intravenous contrast. Automated exposure control, iterative reconstruction, and/or weight based adjustment of the mA/kV was utilized to reduce the radiation dose to as low as reasonably achievable. COMPARISON: None available. CLINICAL HISTORY: head trauma, laceration FINDINGS: BRAIN AND VENTRICLES: No acute hemorrhage. Remote lacunar infarct in right caudate. No evidence of acute infarct. No hydrocephalus. No extra-axial collection. No mass effect or midline shift. ORBITS: No acute abnormality. SINUSES: No acute abnormality. SOFT TISSUES AND SKULL: No acute soft tissue abnormality. No skull fracture. Atherosclerosis of skullbase vasculature without hyperdense vessel or abnormal calcification. IMPRESSION: 1. No acute intracranial abnormality Electronically signed by: Franky Stanford MD 07/23/2024 09:50 PM EST RP Workstation: [id number]     .Laceration Repair  Date/Time: 07/24/2024 5:00 AM  Performed by: Logan Ubaldo NOVAK, PA-C Authorized by: Logan Ubaldo NOVAK, PA-C   Consent:    Consent obtained:  Verbal   Risks discussed:  Need for additional repair, infection, pain and poor cosmetic result Universal protocol:    Patient identity confirmed:  Arm band Anesthesia:    Anesthesia method:  None Laceration details:    Location:  Scalp   Scalp location:  Occipital   Length (cm):  6 Exploration:    Hemostasis achieved with:  Direct pressure Treatment:    Area cleansed with:  Saline   Amount of cleaning:  Standard Skin repair:    Repair method:  Staples   Number of staples:  7 Repair type:    Repair type:  Simple Post-procedure details:    Dressing:  Open (no dressing)   Procedure completion:   Tolerated well, no immediate complications    Medications Ordered in the ED  Tdap (ADACEL) injection 0.5 mL (has no administration in time range)                                    Medical Decision Making  This patient presents to the ED for concern of head injury, this involves an extensive number of treatment options, and is a complaint that carries with it a high risk of complications and morbidity.  The differential diagnosis includes soft tissue injury, intracranial abnormality, others   Co morbidities / Chronic conditions that complicate the patient evaluation  Hypertension, CLL   Additional history obtained:  Additional history obtained from EMR  Lab Tests:  I Ordered, and personally interpreted labs.  The pertinent results include: Leukocytosis of unclear significance with a white count of 15,800   Imaging Studies ordered:  I ordered imaging studies including CT head without contrast I independently visualized and interpreted imaging which showed no acute abnormality I agree with the radiologist interpretation   Cardiac Monitoring: / EKG:  The patient was maintained on a cardiac monitor.  I personally viewed and interpreted the cardiac monitored which showed an underlying rhythm of: Sinus rhythm   Social Determinants of Health:  Patient is a former smoker   Test / Admission - Considered:  Patient with scalp laceration secondary to trauma.  Unclear significance of leukocytosis.  Patient reports mechanical trauma with no syncope or prodromal type symptoms.  Laceration repaired with 7 staples.  These will need to be removed in 7 to 10 days.  No indication for further emergent workup or admission at this time.      Final diagnoses:  None    ED Discharge Orders     None          Logan Ubaldo KATHEE DEVONNA 07/24/24 0505    Midge Golas, MD 07/24/24 0700  "

## 2024-07-24 NOTE — ED Notes (Signed)
 Would care completed and staples placed.  Number of staples 7

## 2024-07-24 NOTE — Discharge Instructions (Signed)
 Your scalp laceration was repaired with 7 staples.  These will need to be removed in 7 to 10 days.  This can be done at the emergency department, urgent care, or primary care provider's office.  Return to the emergency department if you develop any life-threatening symptoms.

## 2024-08-03 ENCOUNTER — Encounter: Payer: Self-pay | Admitting: Orthopaedic Surgery

## 2024-08-03 ENCOUNTER — Ambulatory Visit: Admitting: Orthopaedic Surgery

## 2024-08-03 DIAGNOSIS — M25511 Pain in right shoulder: Secondary | ICD-10-CM

## 2024-08-03 NOTE — Progress Notes (Signed)
 Charles [name] is a 69 year old [name] employee well-known to me.  We are seeing him after he sustained a mechanical fall back in early December which was just 2 months ago.  He fell on outstretched hands injuring his right shoulder.  We had seen him a month earlier for his right shoulder and follow-up for MRI of the shoulder and it showed severe tendinosis of the main tendons of the rotator cuff with just a partial tear.  There is severe tendinosis of the supraspinatus and infraspinatus and moderate tendinosis of the scapularis tendon with just small tearing.  There is also an old tear of the proximal long head of the biceps tendon.  Since I had seen him his wife had passed away from a heart condition.  He has back to work and even working with his shoulder and the state that it send because his work has been great with accommodating for him with no lifting overhead and minimal lifting overall.  He says he knows to go slow with his shoulder.  However he does report worsening shoulder weakness and pain.  On my exam today his external rotation is weak of the right shoulder and his abduction is weak.  He is using more of his deltoids to abduct the shoulder as well.  He compensates for his shoulder pain on how he held his shoulder.  It is waking him up at night as well.  Recent x-rays after the fall showed a normal-appearing shoulder.  However, we do note that his rotator cuff before was partially torn but now it seems like this may be a more significant tear based on clinical exam findings.  I did see him with his case manager today.  We have recommended a new MRI of his right shoulder to rule out a full-thickness rotator cuff tear.  I did give him a note for work with no overhead activities and no lifting greater than 10 pounds with the right arm until further notice.  We will see him in follow-up once we have this MRI.

## 2024-08-05 ENCOUNTER — Other Ambulatory Visit (HOSPITAL_COMMUNITY): Payer: Self-pay

## 2024-08-05 ENCOUNTER — Other Ambulatory Visit: Payer: Self-pay | Admitting: Family Medicine

## 2024-08-05 DIAGNOSIS — I10 Essential (primary) hypertension: Secondary | ICD-10-CM

## 2024-08-05 MED ORDER — LISINOPRIL-HYDROCHLOROTHIAZIDE 20-25 MG PO TABS
1.0000 | ORAL_TABLET | Freq: Every day | ORAL | 1 refills | Status: AC
  Filled 2024-08-05: qty 90, 90d supply, fill #0

## 2024-08-05 MED ORDER — MELOXICAM 15 MG PO TABS
15.0000 mg | ORAL_TABLET | Freq: Every day | ORAL | 1 refills | Status: AC
  Filled 2024-08-05: qty 90, 90d supply, fill #0

## 2024-08-06 ENCOUNTER — Other Ambulatory Visit: Payer: Self-pay

## 2024-08-06 DIAGNOSIS — G8929 Other chronic pain: Secondary | ICD-10-CM

## 2024-08-06 NOTE — Progress Notes (Signed)
 Message sent to April P and Andree with central scheduling

## 2024-08-07 ENCOUNTER — Ambulatory Visit (HOSPITAL_COMMUNITY)
Admission: RE | Admit: 2024-08-07 | Discharge: 2024-08-07 | Disposition: A | Discharge status: Home / Self Care | Source: Ambulatory Visit | Attending: Orthopaedic Surgery | Admitting: Orthopaedic Surgery

## 2024-08-07 DIAGNOSIS — M25511 Pain in right shoulder: Secondary | ICD-10-CM | POA: Insufficient documentation

## 2024-08-07 DIAGNOSIS — G8929 Other chronic pain: Secondary | ICD-10-CM | POA: Diagnosis present

## 2024-08-20 ENCOUNTER — Other Ambulatory Visit: Payer: Self-pay

## 2024-08-20 ENCOUNTER — Inpatient Hospital Stay: Payer: Commercial Managed Care - PPO | Attending: Hematology & Oncology

## 2024-08-20 ENCOUNTER — Encounter: Payer: Self-pay | Admitting: Hematology & Oncology

## 2024-08-20 ENCOUNTER — Inpatient Hospital Stay: Payer: Commercial Managed Care - PPO | Admitting: Hematology & Oncology

## 2024-08-20 VITALS — BP 125/70 | HR 54 | Temp 97.6°F | Resp 18 | Ht 68.0 in | Wt 232.0 lb

## 2024-08-20 DIAGNOSIS — C9110 Chronic lymphocytic leukemia of B-cell type not having achieved remission: Secondary | ICD-10-CM

## 2024-08-20 LAB — CBC WITH DIFFERENTIAL (CANCER CENTER ONLY)
Abs Immature Granulocytes: 0.04 10*3/uL (ref 0.00–0.07)
Basophils Absolute: 0.1 10*3/uL (ref 0.0–0.1)
Basophils Relative: 1 %
Eosinophils Absolute: 0.3 10*3/uL (ref 0.0–0.5)
Eosinophils Relative: 3 %
HCT: 46.3 % (ref 39.0–52.0)
Hemoglobin: 15.5 g/dL (ref 13.0–17.0)
Immature Granulocytes: 0 %
Lymphocytes Relative: 48 %
Lymphs Abs: 5.7 10*3/uL — ABNORMAL HIGH (ref 0.7–4.0)
MCH: 32.4 pg (ref 26.0–34.0)
MCHC: 33.5 g/dL (ref 30.0–36.0)
MCV: 96.9 fL (ref 80.0–100.0)
Monocytes Absolute: 0.7 10*3/uL (ref 0.1–1.0)
Monocytes Relative: 6 %
Neutro Abs: 5 10*3/uL (ref 1.7–7.7)
Neutrophils Relative %: 42 %
Platelet Count: 189 10*3/uL (ref 150–400)
RBC: 4.78 MIL/uL (ref 4.22–5.81)
RDW: 14.6 % (ref 11.5–15.5)
WBC Count: 11.8 10*3/uL — ABNORMAL HIGH (ref 4.0–10.5)
nRBC: 0 % (ref 0.0–0.2)

## 2024-08-20 LAB — CMP (CANCER CENTER ONLY)
ALT: 17 U/L (ref 0–44)
AST: 23 U/L (ref 15–41)
Albumin: 4.6 g/dL (ref 3.5–5.0)
Alkaline Phosphatase: 82 U/L (ref 38–126)
Anion gap: 11 (ref 5–15)
BUN: 15 mg/dL (ref 8–23)
CO2: 29 mmol/L (ref 22–32)
Calcium: 10.2 mg/dL (ref 8.9–10.3)
Chloride: 99 mmol/L (ref 98–111)
Creatinine: 1.11 mg/dL (ref 0.61–1.24)
GFR, Estimated: >60 mL/min
Glucose, Bld: 78 mg/dL (ref 70–99)
Potassium: 4.5 mmol/L (ref 3.5–5.1)
Sodium: 138 mmol/L (ref 135–145)
Total Bilirubin: 0.4 mg/dL (ref 0.0–1.2)
Total Protein: 7.2 g/dL (ref 6.5–8.1)

## 2024-08-20 LAB — LACTATE DEHYDROGENASE: LDH: 158 U/L (ref 105–235)

## 2024-08-20 LAB — SAVE SMEAR(SSMR), FOR PROVIDER SLIDE REVIEW

## 2024-08-20 NOTE — Progress Notes (Signed)
 " Hematology and Oncology Follow Up Visit  Charles [name] [id number] 08/26/1955 69 y.o. 08/20/2024   Principle Diagnosis:  Chronic lymphocytic leukemia (Trisomy 12) - stage C - remission  Current Therapy:   Observation   Interim History:  Charles [name] is here today for follow-up.  I see him once a year, although, for the most part, I do see him in between as he works over at [employer].  A lot has happened to him since we last saw him.  His wife passed away in May 29, 2025.  I really hate that she passed away.  I know she has a quite a few health issues.  I know that he is relieved that she is no longer in pain and suffering.  He has had problems.  He fell with the ice.  He really tore his rotator cuff in the right shoulder.  He is going to need to have surgery for this.  He is not sure when he will need surgery.SABRA  He also sustained a laceration on his scalp at work.  This required 7 sutures.  Thankfully no infections occurred.  We have been watching him for the CLL.  He has done incredibly well.  He has been in remission for this.  It has been over 6 years that he has been in remission.  He has had no fever.  He has had no problem with infections.  He has had no rashes.  He has had no change in bowel or bladder habits.  There has been no leg swelling.  Overall, I would say that his performance status is probably ECOG 1.     Medications:  Allergies as of 08/20/2024   No Known Allergies      Medication List        Accurate as of August 20, 2024 11:31 AM. If you have any questions, ask your nurse or doctor.          Aspirin  Low Dose 81 MG tablet Generic drug: aspirin  EC Take 1 tablet (81 mg total) by mouth daily.   cetirizine 10 MG tablet Commonly known as: ZYRTEC Take 10 mg by mouth daily.   esomeprazole  40 MG capsule Commonly known as: NEXIUM  Take 1 capsule (40 mg total) by mouth daily.   fluticasone  50 MCG/ACT nasal spray Commonly known as: FLONASE  Place 1 spray  into both nostrils daily.   ketoconazole  2 % cream Commonly known as: NIZORAL  Apply topically 2 (two) times daily.   lisinopril -hydrochlorothiazide  20-25 MG tablet Commonly known as: ZESTORETIC  Take 1 tablet by mouth daily.   magic mouthwash (nystatin , lidocaine , diphenhydrAMINE ) suspension Swish and spit 5 mLs by mouth 4 (four) times daily as needed for mouth pain.   meloxicam  15 MG tablet Commonly known as: MOBIC  Take 1 tablet (15 mg total) by mouth daily.   rosuvastatin  20 MG tablet Commonly known as: CRESTOR  Take 1 tablet (20 mg total) by mouth at bedtime.   terbinafine  250 MG tablet Commonly known as: LAMISIL  Take 1 tablet (250 mg total) by mouth daily.   valACYclovir  1000 MG tablet Commonly known as: Valtrex  Take 1 tablet (1,000 mg total) by mouth daily.   Vitamin D3 50 MCG (2000 UT) Tabs Take 2,000 Units by mouth.        Allergies: No Known Allergies  Past Medical History, Surgical history, Social history, and Family History were reviewed and updated.  Review of Systems: Review of Systems  Constitutional: Negative.   HENT: Negative.    Eyes: Negative.  Cardiovascular: Negative.   Gastrointestinal: Negative.   Genitourinary: Negative.   Musculoskeletal: Negative.   Skin: Negative.   Neurological: Negative.   Endo/Heme/Allergies: Negative.   Psychiatric/Behavioral: Negative.       Physical Exam:  height is 5' 8 (1.727 m) and weight is 232 lb (105.2 kg). His oral temperature is 97.6 F (36.4 C). His blood pressure is 125/70 and his pulse is 54 (abnormal). His respiration is 18 and oxygen saturation is 98%.   Wt Readings from Last 3 Encounters:  08/20/24 232 lb (105.2 kg)  05/19/24 240 lb (108.9 kg)  11/19/23 241 lb 3.2 oz (109.4 kg)    Physical Exam Vitals reviewed.  HENT:     Head: Normocephalic and atraumatic.  Eyes:     Pupils: Pupils are equal, round, and reactive to light.  Cardiovascular:     Rate and Rhythm: Normal rate and regular  rhythm.     Heart sounds: Normal heart sounds.  Pulmonary:     Effort: Pulmonary effort is normal.     Breath sounds: Normal breath sounds.  Abdominal:     General: Bowel sounds are normal.     Palpations: Abdomen is soft.  Musculoskeletal:        General: No tenderness or deformity. Normal range of motion.     Cervical back: Normal range of motion.     Comments: He has some decreased range of motion of the right shoulder.  Lymphadenopathy:     Cervical: No cervical adenopathy.  Skin:    General: Skin is warm and dry.     Findings: No erythema or rash.  Neurological:     Mental Status: He is alert and oriented to person, place, and time.  Psychiatric:        Behavior: Behavior normal.        Thought Content: Thought content normal.        Judgment: Judgment normal.       Lab Results  Component Value Date   WBC 11.8 (H) 08/20/2024   HGB 15.5 08/20/2024   HCT 46.3 08/20/2024   MCV 96.9 08/20/2024   PLT 189 08/20/2024   Lab Results  Component Value Date   FERRITIN 301.5 05/23/2020   Lab Results  Component Value Date   RETICCTPCT 1.8 01/31/2011   RBC 4.78 08/20/2024   RETICCTABS 79.6 01/31/2011   Lab Results  Component Value Date   KPAFRELGTCHN 21.8 (H) 02/10/2021   LAMBDASER 13.5 02/10/2021   KAPLAMBRATIO 1.61 02/10/2021   Lab Results  Component Value Date   IGGSERUM 735 08/21/2022   IGA 129 08/21/2022   IGMSERUM 70 08/21/2022   Lab Results  Component Value Date   TOTALPROTELP 6.8 02/10/2021   ALBUMINELP 4.2 02/10/2021   A1GS 0.2 02/10/2021   A2GS 0.8 02/10/2021   BETS 0.9 02/10/2021   BETA2SER 4.1 04/23/2013   GAMS 0.7 02/10/2021   MSPIKE Not Observed 02/10/2021   SPEI * 04/23/2013     Chemistry      Component Value Date/Time   NA 138 08/20/2024 1029   NA 140 08/05/2017 1115   NA 139 06/26/2016 1000   K 4.5 08/20/2024 1029   K 4.4 06/26/2016 1000   CL 99 08/20/2024 1029   CL 102 04/10/2012 0815   CO2 29 08/20/2024 1029   CO2 27 06/26/2016  1000   BUN 15 08/20/2024 1029   BUN 16 08/05/2017 1115   BUN 13.5 06/26/2016 1000   CREATININE 1.11 08/20/2024 1029   CREATININE 1.14  06/11/2024 1241   CREATININE 1.1 06/26/2016 1000      Component Value Date/Time   CALCIUM  10.2 08/20/2024 1029   CALCIUM  9.5 06/26/2016 1000   ALKPHOS 82 08/20/2024 1029   ALKPHOS 83 06/26/2016 1000   AST 23 08/20/2024 1029   AST 20 06/26/2016 1000   ALT 17 08/20/2024 1029   ALT 19 06/26/2016 1000   BILITOT 0.4 08/20/2024 1029   BILITOT 0.69 06/26/2016 1000      Impression and Plan: Mr. Howell is a very pleasant 69 yo male with CLL.  He is doing quite nicely.  His blood counts are doing okay.  He does have quite a few lymphocytes.  However, from a year ago everything has been holding very steady.  I hate that he has had the injury to the right shoulder.  I do not see a problem with him having surgery for this.  He is not sure when this will happen.  I would like to see him back in 6 months.  I just want to make sure we follow him up a little bit more closely so we can see how things are looking with his blood counts and seeing how he has recovered from surgery, which he should have had by then.  SABRA Maude JONELLE Timmy, MD 1/29/202611:31 AM "

## 2024-08-24 ENCOUNTER — Ambulatory Visit: Admitting: Orthopaedic Surgery

## 2024-08-27 ENCOUNTER — Other Ambulatory Visit (HOSPITAL_COMMUNITY): Payer: Self-pay

## 2024-08-27 ENCOUNTER — Other Ambulatory Visit: Payer: Self-pay | Admitting: Family Medicine

## 2024-08-27 DIAGNOSIS — Z8619 Personal history of other infectious and parasitic diseases: Secondary | ICD-10-CM

## 2024-08-27 MED ORDER — NYSTATIN 100000 UNIT/ML MT SUSP
OROMUCOSAL | 0 refills | Status: AC
  Filled 2024-08-27: qty 240, 12d supply, fill #0

## 2024-09-16 ENCOUNTER — Ambulatory Visit: Admitting: Orthopaedic Surgery

## 2024-10-08 ENCOUNTER — Ambulatory Visit: Admitting: Podiatry

## 2024-11-19 ENCOUNTER — Encounter: Admitting: Family Medicine

## 2025-02-15 ENCOUNTER — Inpatient Hospital Stay

## 2025-02-15 ENCOUNTER — Inpatient Hospital Stay: Admitting: Hematology & Oncology
